# Patient Record
Sex: Female | Born: 1950 | Race: White | Hispanic: No | Smoking: Former smoker
Health system: Southern US, Community
[De-identification: ages and names within clinical notes are randomized; demographics above are authoritative.]

## PROBLEM LIST (undated history)

## (undated) DIAGNOSIS — E7800 Pure hypercholesterolemia, unspecified: Secondary | ICD-10-CM

## (undated) DIAGNOSIS — M1990 Unspecified osteoarthritis, unspecified site: Secondary | ICD-10-CM

## (undated) DIAGNOSIS — F410 Panic disorder [episodic paroxysmal anxiety] without agoraphobia: Secondary | ICD-10-CM

## (undated) DIAGNOSIS — R079 Chest pain, unspecified: Secondary | ICD-10-CM

## (undated) DIAGNOSIS — K589 Irritable bowel syndrome without diarrhea: Secondary | ICD-10-CM

## (undated) DIAGNOSIS — K219 Gastro-esophageal reflux disease without esophagitis: Secondary | ICD-10-CM

## (undated) DIAGNOSIS — I2699 Other pulmonary embolism without acute cor pulmonale: Secondary | ICD-10-CM

## (undated) DIAGNOSIS — N289 Disorder of kidney and ureter, unspecified: Secondary | ICD-10-CM

## (undated) DIAGNOSIS — F419 Anxiety disorder, unspecified: Secondary | ICD-10-CM

## (undated) DIAGNOSIS — K5790 Diverticulosis of intestine, part unspecified, without perforation or abscess without bleeding: Secondary | ICD-10-CM

## (undated) DIAGNOSIS — C801 Malignant (primary) neoplasm, unspecified: Secondary | ICD-10-CM

## (undated) HISTORY — PX: TUBAL LIGATION: SHX77

## (undated) HISTORY — DX: Pure hypercholesterolemia, unspecified: E78.00

---

## 1999-05-11 ENCOUNTER — Ambulatory Visit (HOSPITAL_COMMUNITY): Admission: RE | Admit: 1999-05-11 | Discharge: 1999-05-11 | Payer: Self-pay | Admitting: Gastroenterology

## 1999-05-11 ENCOUNTER — Encounter: Payer: Self-pay | Admitting: Gastroenterology

## 1999-05-19 ENCOUNTER — Ambulatory Visit (HOSPITAL_COMMUNITY): Admission: RE | Admit: 1999-05-19 | Discharge: 1999-05-19 | Payer: Self-pay | Admitting: Gastroenterology

## 1999-06-24 ENCOUNTER — Encounter: Payer: Self-pay | Admitting: Gynecology

## 1999-06-24 ENCOUNTER — Encounter: Admission: RE | Admit: 1999-06-24 | Discharge: 1999-06-24 | Payer: Self-pay | Admitting: Gynecology

## 2000-12-07 ENCOUNTER — Encounter: Payer: Self-pay | Admitting: Gynecology

## 2000-12-07 ENCOUNTER — Encounter: Admission: RE | Admit: 2000-12-07 | Discharge: 2000-12-07 | Payer: Self-pay | Admitting: Gynecology

## 2001-01-10 ENCOUNTER — Other Ambulatory Visit: Admission: RE | Admit: 2001-01-10 | Discharge: 2001-01-10 | Payer: Self-pay | Admitting: Gynecology

## 2001-06-06 ENCOUNTER — Ambulatory Visit (HOSPITAL_COMMUNITY): Admission: RE | Admit: 2001-06-06 | Discharge: 2001-06-06 | Payer: Self-pay | Admitting: Gastroenterology

## 2002-11-27 ENCOUNTER — Other Ambulatory Visit: Admission: RE | Admit: 2002-11-27 | Discharge: 2002-11-27 | Payer: Self-pay | Admitting: Gynecology

## 2004-02-07 ENCOUNTER — Other Ambulatory Visit: Admission: RE | Admit: 2004-02-07 | Discharge: 2004-02-07 | Payer: Self-pay | Admitting: Gynecology

## 2006-02-03 ENCOUNTER — Other Ambulatory Visit: Admission: RE | Admit: 2006-02-03 | Discharge: 2006-02-03 | Payer: Self-pay | Admitting: Gynecology

## 2013-06-28 ENCOUNTER — Other Ambulatory Visit: Payer: Self-pay | Admitting: Gastroenterology

## 2013-06-28 DIAGNOSIS — R109 Unspecified abdominal pain: Secondary | ICD-10-CM

## 2013-07-05 ENCOUNTER — Ambulatory Visit
Admission: RE | Admit: 2013-07-05 | Discharge: 2013-07-05 | Disposition: A | Discharge status: Home / Self Care | Payer: BC Managed Care – PPO | Source: Ambulatory Visit | Attending: Gastroenterology | Admitting: Gastroenterology

## 2013-07-05 ENCOUNTER — Encounter (INDEPENDENT_AMBULATORY_CARE_PROVIDER_SITE_OTHER): Payer: Self-pay

## 2013-07-05 DIAGNOSIS — R109 Unspecified abdominal pain: Secondary | ICD-10-CM

## 2016-01-26 DIAGNOSIS — I2699 Other pulmonary embolism without acute cor pulmonale: Secondary | ICD-10-CM

## 2016-01-26 HISTORY — DX: Other pulmonary embolism without acute cor pulmonale: I26.99

## 2016-05-10 ENCOUNTER — Encounter: Payer: Self-pay | Admitting: Adult Health

## 2017-03-26 ENCOUNTER — Emergency Department (HOSPITAL_COMMUNITY): Payer: Medicare Other

## 2017-03-26 ENCOUNTER — Observation Stay (HOSPITAL_COMMUNITY)
Admission: EM | Admit: 2017-03-26 | Discharge: 2017-03-27 | Disposition: A | Discharge status: Home / Self Care | Payer: Medicare Other | Attending: Family Medicine | Admitting: Family Medicine

## 2017-03-26 ENCOUNTER — Encounter (HOSPITAL_COMMUNITY): Payer: Self-pay

## 2017-03-26 ENCOUNTER — Other Ambulatory Visit: Payer: Self-pay

## 2017-03-26 DIAGNOSIS — R002 Palpitations: Secondary | ICD-10-CM | POA: Diagnosis not present

## 2017-03-26 DIAGNOSIS — F419 Anxiety disorder, unspecified: Secondary | ICD-10-CM | POA: Diagnosis not present

## 2017-03-26 DIAGNOSIS — Z905 Acquired absence of kidney: Secondary | ICD-10-CM | POA: Insufficient documentation

## 2017-03-26 DIAGNOSIS — K5790 Diverticulosis of intestine, part unspecified, without perforation or abscess without bleeding: Secondary | ICD-10-CM | POA: Diagnosis present

## 2017-03-26 DIAGNOSIS — K219 Gastro-esophageal reflux disease without esophagitis: Secondary | ICD-10-CM | POA: Insufficient documentation

## 2017-03-26 DIAGNOSIS — F410 Panic disorder [episodic paroxysmal anxiety] without agoraphobia: Secondary | ICD-10-CM | POA: Diagnosis present

## 2017-03-26 DIAGNOSIS — Z823 Family history of stroke: Secondary | ICD-10-CM | POA: Insufficient documentation

## 2017-03-26 DIAGNOSIS — Z8249 Family history of ischemic heart disease and other diseases of the circulatory system: Secondary | ICD-10-CM | POA: Diagnosis not present

## 2017-03-26 DIAGNOSIS — Z86711 Personal history of pulmonary embolism: Secondary | ICD-10-CM | POA: Diagnosis not present

## 2017-03-26 DIAGNOSIS — N289 Disorder of kidney and ureter, unspecified: Secondary | ICD-10-CM | POA: Diagnosis not present

## 2017-03-26 DIAGNOSIS — D72829 Elevated white blood cell count, unspecified: Secondary | ICD-10-CM | POA: Diagnosis not present

## 2017-03-26 DIAGNOSIS — K047 Periapical abscess without sinus: Secondary | ICD-10-CM | POA: Diagnosis not present

## 2017-03-26 DIAGNOSIS — R0789 Other chest pain: Secondary | ICD-10-CM | POA: Diagnosis not present

## 2017-03-26 DIAGNOSIS — R079 Chest pain, unspecified: Secondary | ICD-10-CM | POA: Diagnosis not present

## 2017-03-26 DIAGNOSIS — K5791 Diverticulosis of intestine, part unspecified, without perforation or abscess with bleeding: Secondary | ICD-10-CM

## 2017-03-26 HISTORY — DX: Panic disorder (episodic paroxysmal anxiety): F41.0

## 2017-03-26 HISTORY — DX: Other pulmonary embolism without acute cor pulmonale: I26.99

## 2017-03-26 HISTORY — DX: Diverticulosis of intestine, part unspecified, without perforation or abscess without bleeding: K57.90

## 2017-03-26 HISTORY — DX: Gastro-esophageal reflux disease without esophagitis: K21.9

## 2017-03-26 HISTORY — DX: Anxiety disorder, unspecified: F41.9

## 2017-03-26 HISTORY — DX: Chest pain, unspecified: R07.9

## 2017-03-26 HISTORY — DX: Irritable bowel syndrome, unspecified: K58.9

## 2017-03-26 HISTORY — DX: Disorder of kidney and ureter, unspecified: N28.9

## 2017-03-26 LAB — URINALYSIS, ROUTINE W REFLEX MICROSCOPIC
BACTERIA UA: NONE SEEN
BILIRUBIN URINE: NEGATIVE
Glucose, UA: NEGATIVE mg/dL
Ketones, ur: NEGATIVE mg/dL
LEUKOCYTES UA: NEGATIVE
Nitrite: NEGATIVE
PH: 7 (ref 5.0–8.0)
Protein, ur: NEGATIVE mg/dL
Specific Gravity, Urine: 1.012 (ref 1.005–1.030)

## 2017-03-26 LAB — LIPID PANEL
CHOL/HDL RATIO: 3.9 ratio
Cholesterol: 248 mg/dL — ABNORMAL HIGH (ref 0–200)
HDL: 64 mg/dL (ref >40)
LDL CALC: 173 mg/dL — AB (ref 0–99)
Triglycerides: 57 mg/dL (ref <150)
VLDL: 11 mg/dL (ref 0–40)

## 2017-03-26 LAB — PROTIME-INR
INR: 0.96
Prothrombin Time: 12.6 seconds (ref 11.4–15.2)

## 2017-03-26 LAB — COMPREHENSIVE METABOLIC PANEL
ALK PHOS: 57 U/L (ref 38–126)
ALT: 28 U/L (ref 14–54)
AST: 26 U/L (ref 15–41)
Albumin: 3.9 g/dL (ref 3.5–5.0)
Anion gap: 11 (ref 5–15)
BILIRUBIN TOTAL: 0.7 mg/dL (ref 0.3–1.2)
BUN: 16 mg/dL (ref 6–20)
CALCIUM: 9.3 mg/dL (ref 8.9–10.3)
CO2: 24 mmol/L (ref 22–32)
Chloride: 104 mmol/L (ref 101–111)
Creatinine, Ser: 0.73 mg/dL (ref 0.44–1.00)
Glucose, Bld: 103 mg/dL — ABNORMAL HIGH (ref 65–99)
Potassium: 3.5 mmol/L (ref 3.5–5.1)
SODIUM: 139 mmol/L (ref 135–145)
TOTAL PROTEIN: 6.6 g/dL (ref 6.5–8.1)

## 2017-03-26 LAB — CBC WITH DIFFERENTIAL/PLATELET
BASOS ABS: 0 10*3/uL (ref 0.0–0.1)
BASOS PCT: 0 %
EOS PCT: 0 %
Eosinophils Absolute: 0 10*3/uL (ref 0.0–0.7)
HCT: 41.1 % (ref 36.0–46.0)
Hemoglobin: 14.6 g/dL (ref 12.0–15.0)
Lymphocytes Relative: 5 %
Lymphs Abs: 0.9 10*3/uL (ref 0.7–4.0)
MCH: 34.8 pg — ABNORMAL HIGH (ref 26.0–34.0)
MCHC: 35.5 g/dL (ref 30.0–36.0)
MCV: 98.1 fL (ref 78.0–100.0)
MONO ABS: 0.7 10*3/uL (ref 0.1–1.0)
MONOS PCT: 4 %
Neutro Abs: 16.9 10*3/uL — ABNORMAL HIGH (ref 1.7–7.7)
Neutrophils Relative %: 91 %
PLATELETS: 203 10*3/uL (ref 150–400)
RBC: 4.19 MIL/uL (ref 3.87–5.11)
RDW: 12.7 % (ref 11.5–15.5)
WBC: 18.5 10*3/uL — ABNORMAL HIGH (ref 4.0–10.5)

## 2017-03-26 LAB — I-STAT TROPONIN, ED
TROPONIN I, POC: 0 ng/mL (ref 0.00–0.08)
Troponin i, poc: 0 ng/mL (ref 0.00–0.08)

## 2017-03-26 LAB — BRAIN NATRIURETIC PEPTIDE: B Natriuretic Peptide: 173.3 pg/mL — ABNORMAL HIGH (ref 0.0–100.0)

## 2017-03-26 LAB — TROPONIN I

## 2017-03-26 LAB — MAGNESIUM: MAGNESIUM: 1.7 mg/dL (ref 1.7–2.4)

## 2017-03-26 LAB — CBG MONITORING, ED: Glucose-Capillary: 105 mg/dL — ABNORMAL HIGH (ref 65–99)

## 2017-03-26 LAB — TSH: TSH: 1.554 u[IU]/mL (ref 0.350–4.500)

## 2017-03-26 MED ORDER — ASPIRIN 81 MG PO CHEW: 324.0000 mg | CHEWABLE_TABLET | Freq: Once | ORAL | Status: DC

## 2017-03-26 MED ORDER — ACETAMINOPHEN 325 MG PO TABS
650.0000 mg | ORAL_TABLET | ORAL | Status: DC | PRN
  Administered 2017-03-26 (×2): 650 mg via ORAL
  Filled 2017-03-26 (×2): qty 2

## 2017-03-26 MED ORDER — LORAZEPAM 2 MG/ML IJ SOLN
0.5000 mg | Freq: Once | INTRAMUSCULAR | Status: AC
  Administered 2017-03-26: 0.5 mg via INTRAVENOUS
  Filled 2017-03-26: qty 1

## 2017-03-26 MED ORDER — PANTOPRAZOLE SODIUM 40 MG PO TBEC
40.0000 mg | DELAYED_RELEASE_TABLET | Freq: Every day | ORAL | Status: DC
  Administered 2017-03-26: 40 mg via ORAL
  Filled 2017-03-26: qty 1

## 2017-03-26 MED ORDER — LORAZEPAM 0.5 MG PO TABS: 0.5000 mg | ORAL_TABLET | Freq: Two times a day (BID) | ORAL | Status: DC | PRN

## 2017-03-26 MED ORDER — MORPHINE SULFATE (PF) 4 MG/ML IV SOLN: 2.0000 mg | INTRAVENOUS | Status: DC | PRN

## 2017-03-26 MED ORDER — ONDANSETRON HCL 4 MG/2ML IJ SOLN: 4.0000 mg | Freq: Four times a day (QID) | INTRAMUSCULAR | Status: DC | PRN

## 2017-03-26 MED ORDER — GI COCKTAIL ~~LOC~~: 30.0000 mL | Freq: Four times a day (QID) | ORAL | Status: DC | PRN

## 2017-03-26 MED ORDER — ASPIRIN 81 MG PO CHEW
324.0000 mg | CHEWABLE_TABLET | Freq: Once | ORAL | Status: DC
  Filled 2017-03-26 (×2): qty 4

## 2017-03-26 MED ORDER — GI COCKTAIL ~~LOC~~
30.0000 mL | Freq: Once | ORAL | Status: AC
  Administered 2017-03-26: 30 mL via ORAL
  Filled 2017-03-26: qty 30

## 2017-03-26 MED ORDER — ENOXAPARIN SODIUM 40 MG/0.4ML ~~LOC~~ SOLN
40.0000 mg | SUBCUTANEOUS | Status: DC
  Administered 2017-03-26: 40 mg via SUBCUTANEOUS
  Filled 2017-03-26: qty 0.4

## 2017-03-26 MED ORDER — FAMOTIDINE IN NACL 20-0.9 MG/50ML-% IV SOLN
20.0000 mg | Freq: Two times a day (BID) | INTRAVENOUS | Status: DC
  Administered 2017-03-26 (×2): 20 mg via INTRAVENOUS
  Filled 2017-03-26 (×3): qty 50

## 2017-03-26 NOTE — ED Provider Notes (Signed)
[place] EMERGENCY DEPARTMENT Provider Note   CSN: [id number] Arrival date & time: 03/26/17  0407     History   Chief Complaint Chief Complaint  Patient presents with  . Chest Pain    HPI Melissa [name] is a 67 y.o. female.  HPI Presents with concern of fluttering and pain. She notes that she has had episodes of fluttering in the past, but now over the past 2 days she has had much more frequent episodes of palpitations, as well as chest pressure. The 2 entities are not necessarily concurrent, nor are there necessarily related, but she has had both, together, and separately, over the past 2 days. No clear precipitating, alleviating, exacerbating factors. No current pain, no current fluttering sensation. She had 2 episodes, while in transport with EMS, neither of which was noted on monitor to be abnormal, according to providers. She denies recent changes in health beyond taking amoxicillin for a dental infection. She does note ongoing life stress, including the death of her husband, within the past few months.  Past Medical History:  Diagnosis Date  . GERD (gastroesophageal reflux disease)   . Kidney disorder    Pt states only has left kidney  . Pulmonary embolism ([place]) 2018   pt states resolved and no longer takes blood thinners     Home Medications    Family History Husband just died from pancreatic cancer  Social History Social History   Tobacco Use  . Smoking status: Never Smoker  . Smokeless tobacco: Never Used  Substance Use Topics  . Alcohol use: Not on file  . Drug use: Not on file     Allergies   Patient has no allergy information on record.   Review of Systems Review of Systems  Constitutional:       Per HPI, otherwise negative  HENT:       Per HPI, otherwise negative  Respiratory:       Per HPI, otherwise negative  Cardiovascular:       Per HPI, otherwise negative  Gastrointestinal: Negative for vomiting.  Endocrine:         Negative aside from HPI  Genitourinary:       Neg aside from HPI   Musculoskeletal:       Per HPI, otherwise negative  Skin: Negative.   Neurological: Negative for syncope.     Physical Exam Updated Vital Signs BP (!) 151/81   Pulse 75   Temp 98.1 F (36.7 C) (Oral)   Resp 14   Ht 5\' 4"  (1.626 m)   Wt 74.4 kg (164 lb)   SpO2 97%   BMI 28.15 kg/m   Physical Exam  Constitutional: She is oriented to person, place, and time. She appears well-developed and well-nourished. No distress.  HENT:  Head: Normocephalic and atraumatic.  Eyes: Conjunctivae and EOM are normal.  Cardiovascular: Normal rate and regular rhythm.  Pulmonary/Chest: Effort normal and breath sounds normal. No stridor. No respiratory distress.  Abdominal: She exhibits no distension.  Musculoskeletal: She exhibits no edema.  Neurological: She is alert and oriented to person, place, and time. No cranial nerve deficit.  Skin: Skin is warm and dry.  Psychiatric: She has a normal mood and affect.  Nursing note and vitals reviewed.    ED Treatments / Results  Labs (all labs ordered are listed, but only abnormal results are displayed) Labs Reviewed  CBC WITH DIFFERENTIAL/PLATELET - Abnormal; Notable for the following components:      Result Value  WBC 18.5 (*)    MCH 34.8 (*)    Neutro Abs 16.9 (*)    All other components within normal limits  CBG MONITORING, ED - Abnormal; Notable for the following components:   Glucose-Capillary 105 (*)    All other components within normal limits  PROTIME-INR  COMPREHENSIVE METABOLIC PANEL  MAGNESIUM  TROPONIN I  BRAIN NATRIURETIC PEPTIDE  I-STAT TROPONIN, ED    EKG  EKG Interpretation  Date/Time:  Saturday March 26 2017 04:10:03 EST Ventricular Rate:  79 PR Interval:    QRS Duration: 100 QT Interval:  394 QTC Calculation: 452 R Axis:   -21 Text Interpretation:  Sinus rhythm Borderline left axis deviation Low voltage, precordial leads Otherwise  within normal limits Confirmed by Carmin Muskrat [phone]) on 03/26/2017 4:56:03 AM      EMS rhythm strip with sinus rhythm, rate 83, unremarkable    radiology Dg Chest Portable 1 View  Result Date: 03/26/2017 CLINICAL DATA:  Chest pain and shortness of breath. EXAM: PORTABLE CHEST 1 VIEW COMPARISON:  05/04/2016 FINDINGS: The cardiomediastinal contours are normal. The lungs are clear. Pulmonary vasculature is normal. No consolidation, pleural effusion, or pneumothorax. No acute osseous abnormalities are seen. IMPRESSION: No acute abnormality. Electronically Signed   By: Jeb Levering M.D.   On: 03/26/2017 04:38    Procedures Procedures (including critical care time)  Medications Ordered in ED Medications  gi cocktail (Maalox,Lidocaine,Donnatal) (not administered)  LORazepam (ATIVAN) injection 0.5 mg (not administered)     Initial Impression / Assessment and Plan / ED Course  I have reviewed the triage vital signs and the nursing notes.  Pertinent labs & imaging results that were available during my care of the patient were reviewed by me and considered in my medical decision making (see chart for details).    5:40 AM I was called to the patient's room as she was experiencing a fluttering sensation. Is awake, alert, and on cardiac monitor has a sinus rhythm, rate 75, with no ischemic changes. She states that she just had an episode of pain as well, though this has resolved.  6:34 AM Patient continues to have episodic discomfort, though she states that she feels generally better, after Ativan and a GI cocktail. Given the absence of prior cardiac evaluation, though the initial studies here are generally reassuring aside from mild elevation in BNP, and with her episodic pain, palpitations, the patient will be admitted for further evaluation and management.  Final Clinical Impressions(s) / ED Diagnoses  Atypical chest pain   Carmin Muskrat, MD 03/26/17 [phone]

## 2017-03-26 NOTE — Progress Notes (Signed)
Alert and oriented female admitted to unit with daughter present. Patient states she is not having any chest pain at this time and hasn't for a while. Skin warm and dry, v/s WNL.   Patient shares with this RN that she just lost her husband in Oct 2018 to pancreatic cancer and that she was his primary caregiver. She states she hopes this is just "nerves".   Call bell at side and will continue to monitor for any s/sx of discomfort. Pt is up ad lib.  Chaplain services offered but declined at this time.

## 2017-03-26 NOTE — ED Notes (Signed)
ED Provider at bedside. 

## 2017-03-26 NOTE — H&P (Signed)
History and Physical    Melissa [name] GUR:[id number] DOB: Jun 17, 1950 DOA: 03/26/2017  PCP: [facility] Patient coming from: home  Chief Complaint: chest pressure/fluttering  HPI: Melissa [name] is a very pleasant 67 y.o. female with medical history significant for remote PE no longer on anticoagulant, kidney disorder, irritable bowel syndrome, GERD, diverticulosis, anxiety/panic attacks presents to the emergency Department chief complaint persistent chest pressure/fluttering. [facility] are asked to admit  Information is obtained from the patient. She states last week she had a dental infection was placed on amoxicillin and had that tooth extracted. She did not complete the amoxicillin as it made her nauseous. 2 days ago she developed palpitations associated with shortness of breath. She states sensation was constant and not related to rest or activity. today she also developed chest "pressure". States the pressure is located anterior chest. She describes a "jitteriness". She denies any headache dizziness syncope or near-syncope. She denies diaphoresis nausea vomiting lower extremity edema. She denies any fever chills. She denies abdominal pain dysuria hematuria frequency or urgency. He reports he vacillates between constipation and diarrhea due to her irritable bowel syndrome. He denies melena bright red blood per rectum. She had a colonoscopy recently revealing diverticulosis and internal hemorrhoids. She also reports in January 2017 her husband was diagnosed with pancreatic cancer was his primary caregiver and he died 4 months ago.  Reportedly while being transported to the hospital by EMS she stated she was having palpitations and EKG within the limits of normal    ED Course: In the emergency department she's afebrile hemodynamically stable and not hypoxic.  Review of Systems: As per HPI otherwise all other systems reviewed and are negative.    Ambulatory Status: Ambulates independently and is independent with ADLs  Past Medical History:  Diagnosis Date  . Anxiety   . Chest pain   . Diverticulosis   . GERD (gastroesophageal reflux disease)   . IBS (irritable bowel syndrome)   . Kidney disorder    Pt states only has left kidney  . Panic attacks   . Pulmonary embolism ([place]) 2018   pt states resolved and no longer takes blood thinners      Social History   Socioeconomic History  . Marital status: Married    Spouse name: Not on file  . Number of children: Not on file  . Years of education: Not on file  . Highest education level: Not on file  Social Needs  . Financial resource strain: Not on file  . Food insecurity - worry: Not on file  . Food insecurity - inability: Not on file  . Transportation needs - medical: Not on file  . Transportation needs - non-medical: Not on file  Occupational History  . Not on file  Tobacco Use  . Smoking status: Never Smoker  . Smokeless tobacco: Never Used  Substance and Sexual Activity  . Alcohol use: Not on file  . Drug use: Not on file  . Sexual activity: Not on file  Other Topics Concern  . Not on file  Social History Narrative  . Not on file    Not on File  Family History  Problem Relation Age of Onset  . Alzheimer's disease Mother   . CAD Father   . Stroke Father     Prior to Admission medications   Not on File    Physical Exam: Vitals:   03/26/17 0530 03/26/17 0615 03/26/17 0645 03/26/17 0700  BP: (!) 151/81 127/70 121/69 138/77  Pulse: 75 72 72 83  Resp: 14 15 15 15   Temp:      TempSrc:      SpO2: 97% 94% 93% 96%  Weight:      Height:         General:  Appears only slightly anxious but in no acute distress Eyes:  PERRL, EOMI, normal lids, iris ENT:  grossly normal hearing, lips & tongue, mucous membranes of her mouth are pink slightly dry Neck:  no LAD, masses or thyromegaly Cardiovascular:  RRR, no m/r/g. No LE edema.  Respiratory:  CTA  bilaterally, no w/r/r. Normal respiratory effort. Abdomen:  soft, ntnd, positive bowel sounds no guarding or rebounding Skin:  no rash or induration seen on limited exam Musculoskeletal:  grossly normal tone BUE/BLE, good ROM, no bony abnormality Psychiatric:  grossly normal mood and affect, speech fluent and appropriate, AOx3 Neurologic:  CN 2-12 grossly intact, moves all extremities in coordinated fashion, sensation intact. [place] clear facial symmetry  Labs on Admission: I have personally reviewed following labs and imaging studies  CBC: Recent Labs  Lab 03/26/17 0429  WBC 18.5*  NEUTROABS 16.9*  HGB 14.6  HCT 41.1  MCV 98.1  PLT 161   Basic Metabolic Panel: Recent Labs  Lab 03/26/17 0429  NA 139  K 3.5  CL 104  CO2 24  GLUCOSE 103*  BUN 16  CREATININE 0.73  CALCIUM 9.3  MG 1.7   GFR: Estimated Creatinine Clearance: 68.4 mL/min (by C-G formula based on SCr of 0.73 mg/dL). Liver Function Tests: Recent Labs  Lab 03/26/17 0429  AST 26  ALT 28  ALKPHOS 57  BILITOT 0.7  PROT 6.6  ALBUMIN 3.9   No results for input(s): LIPASE, AMYLASE in the last 168 hours. No results for input(s): AMMONIA in the last 168 hours. Coagulation Profile: Recent Labs  Lab 03/26/17 0429  INR 0.96   Cardiac Enzymes: Recent Labs  Lab 03/26/17 0429  TROPONINI <0.03   BNP (last 3 results) No results for input(s): PROBNP in the last 8760 hours. HbA1C: No results for input(s): HGBA1C in the last 72 hours. CBG: Recent Labs  Lab 03/26/17 0444  GLUCAP 105*   Lipid Profile: No results for input(s): CHOL, HDL, LDLCALC, TRIG, CHOLHDL, LDLDIRECT in the last 72 hours. Thyroid Function Tests: No results for input(s): TSH, T4TOTAL, FREET4, T3FREE, THYROIDAB in the last 72 hours. Anemia Panel: No results for input(s): VITAMINB12, FOLATE, FERRITIN, TIBC, IRON, RETICCTPCT in the last 72 hours. Urine analysis: No results found for: COLORURINE, APPEARANCEUR, LABSPEC, PHURINE, GLUCOSEU,  HGBUR, BILIRUBINUR, KETONESUR, PROTEINUR, UROBILINOGEN, NITRITE, LEUKOCYTESUR  Creatinine Clearance: Estimated Creatinine Clearance: 68.4 mL/min (by C-G formula based on SCr of 0.73 mg/dL).  Sepsis Labs: @LABRCNTIP (procalcitonin:4,lacticidven:4) )No results found for this or any previous visit (from the past 240 hour(s)).   Radiological Exams on Admission: Dg Chest Portable 1 View  Result Date: 03/26/2017 CLINICAL DATA:  Chest pain and shortness of breath. EXAM: PORTABLE CHEST 1 VIEW COMPARISON:  05/04/2016 FINDINGS: The cardiomediastinal contours are normal. The lungs are clear. Pulmonary vasculature is normal. No consolidation, pleural effusion, or pneumothorax. No acute osseous abnormalities are seen. IMPRESSION: No acute abnormality. Electronically Signed   By: Jeb Levering M.D.   On: 03/26/2017 04:38    EKG: Sinus rhythm Borderline left axis deviation Low voltage, precordial leads Otherwise within normal limits  Assessment/Plan Principal Problem:   Chest pain Active Problems:   Panic attacks   Leukocytosis   GERD (gastroesophageal reflux disease)   Diverticulosis  Anxiety   Kidney disorder   Infected tooth   #1. Chest pain. Atypical. Likely related to anxiety situational in the setting of generalized anxiety disorder? Also has a history of GERD. Heart score 3. Father with heart disease and stroke former smoker quit in 1987. Initial troponin negative. EKG as noted above. Chest x-ray with no acute abnormality -Admit to telemetry -Cycle troponin -Serial EKG -Obtain a TSH -Lipid panel -Aspirin -GI cocktail -Likely discharge tomorrow follow-up with PCP to determine need for OP stress test  #2. Anxiety/panic attacks. Patient reports history of same. She states she has tried Lexapro in the past but stopped due to side effects. She says she takes one half of a Xanax on occasion to help her sleep. She believes the dosage is 0.25. Is been recently died and for a year prior to  that she was his primary caregiver. More recently she had tooth abscess with extraction, has a pet who is ill needing surgery.  -When necessary Ativan -Outpatient follow-up with primary care provider  #3. Leukocytosis. She recently had a tooth extracted due to infection. WBC 18.5. She was given amoxicillin prior to the extraction but she did not complete the course. Chest x-ray without infiltrate. Urinalysis pending. [place] the emergency department she is afebrile hemodynamically stable and nontoxic appearing -Monitor  #4. GERD. -GI cocktail -Continue home meds  #5. Diverticulosis. Stable  #6. Kidney disorder. She states 15 years ago her right kidney atrophied. BUN and creatinine within the limits of normal     DVT prophylaxis: lovenox  Code Status: full  Family Communication: daughter at bedside  Disposition Plan: home hopefully in am  Consults called: none  Admission status: obs    Radene Gunning MD [facility]  If 7PM-7AM, please contact night-coverage [email or web address] Password [facility]  03/26/2017, 7:36 AM

## 2017-03-26 NOTE — ED Triage Notes (Signed)
Pt cc of chest pain x 2days. Pt states feel flutter sensation with weakness and SOB. Pt states chest pain has been constant. No n/v/d associated with chest pain. Pt states has hx of panic attacks and a lot has occurred within the year including losing her husband. States takes medication for anxiety.

## 2017-03-26 NOTE — ED Notes (Signed)
Pt states due to kidney disease and stomach ulcers does not take aspirin.

## 2017-03-26 NOTE — ED Notes (Signed)
Pt alert and oriented x4. Skin warm and dry. Respirations equal and unlabored. s1 and s2 heart sounds audible. NSR to cardiac monitor. Pt cc of chest pain x 2days. Denies chest pain radiating, states currently does  Not have chest pain. Pt ambulatory with steady gait. VSS.

## 2017-03-26 NOTE — ED Notes (Signed)
Pt ambulatory to restroom with steady gait.

## 2017-03-26 NOTE — Progress Notes (Signed)
Patient has offered no c/o discomfort, or specific chest pain since arrival to unit. She has been up and down in her room to bathroom.  Call bell at side and pt instructed to call if she experiences any chest discomfort at all.

## 2017-03-26 NOTE — ED Notes (Signed)
Attempted to call report, RN to call back.

## 2017-03-27 DIAGNOSIS — F419 Anxiety disorder, unspecified: Secondary | ICD-10-CM | POA: Diagnosis not present

## 2017-03-27 DIAGNOSIS — R0789 Other chest pain: Secondary | ICD-10-CM | POA: Diagnosis not present

## 2017-03-27 DIAGNOSIS — R002 Palpitations: Secondary | ICD-10-CM | POA: Diagnosis not present

## 2017-03-27 DIAGNOSIS — D72829 Elevated white blood cell count, unspecified: Secondary | ICD-10-CM | POA: Diagnosis not present

## 2017-03-27 NOTE — Discharge Summary (Signed)
Physician Discharge Summary  Melissa [name] WUJ:[id number] DOB: [birth year] DOA: 03/26/2017  [name]  Admit date: 03/26/2017 Discharge date: 03/27/2017  Admitted From: Home  Disposition:  Home   Recommendations for Outpatient Follow-up:  1. Follow up with PCP in 1-2 weeks 2. Follow up with Cardiology monitor clinic tomorrow 3. Follow up for stress testing with Cardiology   Home Health: None  Equipment/Devices: none  Discharge Condition: Good  CODE STATUS: FULL Diet recommendation: Cardiac  Brief/Interim Summary: Mrs. Melissa [name] is a 67 yo F with anxiety who presents with chest discomfort and palpitations.  The patient was in her usual state of health until yesterday morning at 2AM she was awoken by "heart fluttering" and chest discomfort.  She called 9-1-1, who transported her to the hospital.  There she was found to have NSR on ECG, equivocal/minimal ST changes inferiorly, negative troponin and TRH were asked to rule out ACS.       1. Palpitations She was monitored 24 hours on telemetry and had only Normal sinus rhythm, but also no definite further episodes of palpitations or fluttering while on tele.  As a result, was discharged with follow up with Cardiology for cardiac monitor.  2. Chest pain This was atypical in character and likely from panic, but given her chest pain as well as age, history of coronary disease in her father, referral for stress treadmill was made.  3. Anxiety The patient stated she has history of panic attacks, none recently, including the current episode, which was not a panic attack she feels.  She stated she had not yet started buspirone given by her PCP, but was now determined to start it, and will follow up with her PCP soon.        Discharge Diagnoses:  Principal Problem:   Chest pain Active Problems:   Panic attacks   Leukocytosis   GERD (gastroesophageal reflux disease)   Diverticulosis   Anxiety  Kidney disorder   Infected tooth    Discharge Instructions  Discharge Instructions    Diet general   Complete by:  As directed    Discharge instructions   Complete by:  As directed    From Dr. Loleta Books: You were admitted for heart palpitations and chest pain. While you were here, your ECG and telemetry monitoring of the rate and rhythm of your heart showed no abnormal events (no "arrhythmias").   Our Cardiology office ([facility]) will contact you Monday afternoon to ask you to come in and pick up the monitor, teach you how to use it and set up follow up. They will also help you arrange the stress test in follow up.   Follow up with Dr. Deatra [name] this week or next week to discuss the buspirone, and how you feel with it, and if you want to try alternatives.   Increase activity slowly   Complete by:  As directed      Allergies as of 03/27/2017      Reactions   Clindamycin Diarrhea, Nausea Only      Medication List    TAKE these medications   acetaminophen 500 MG tablet Commonly known as:  TYLENOL Take 1,000 mg by mouth every 6 (six) hours as needed for mild pain.   ALPRAZolam 0.25 MG tablet Commonly known as:  XANAX Take 0.25 mg by mouth at bedtime as needed for anxiety.   hydrocortisone cream 1 % Apply 1 application topically as needed for itching.   hyoscyamine 0.125 MG Tbdp  disintergrating tablet Commonly known as:  ANASPAZ Take 0.125 mg by mouth every 6 (six) hours as needed.   omeprazole 20 MG capsule Commonly known as:  PRILOSEC Take 20 mg by mouth 2 (two) times daily before a meal.   tetrahydrozoline 0.05 % ophthalmic solution Place 1 drop into both eyes as needed (dry eyes).      Follow-up Information    [place] Follow up.   Specialty:  Family Medicine Contact information: [address] [place] [address] [phone]          Allergies  Allergen Reactions  . Clindamycin Diarrhea and Nausea  Only    Consultations:  None   Procedures/Studies: Dg Chest Portable 1 View  Result Date: 03/26/2017 CLINICAL DATA:  Chest pain and shortness of breath. EXAM: PORTABLE CHEST 1 VIEW COMPARISON:  05/04/2016 FINDINGS: The cardiomediastinal contours are normal. The lungs are clear. Pulmonary vasculature is normal. No consolidation, pleural effusion, or pneumothorax. No acute osseous abnormalities are seen. IMPRESSION: No acute abnormality. Electronically Signed   By: Jeb Levering M.D.   On: 03/26/2017 04:38       Subjective: Feels well.  No more fluttering, no more chest pain.  No dyspnea, pleuritic pain, hypoxia, weakness.  Discharge Exam: Vitals:   03/27/17 0624 03/27/17 0805  BP: 128/76 132/69  Pulse: 64 65  Resp: 16 18  Temp: 98 F (36.7 C) 98 F (36.7 C)  SpO2: 97% 100%   Vitals:   03/26/17 2113 03/27/17 0132 03/27/17 0624 03/27/17 0805  BP: 127/74 121/74 128/76 132/69  Pulse: 70 67 64 65  Resp: 16 18 16 18   Temp: 98.2 F (36.8 C) 97.7 F (36.5 C) 98 F (36.7 C) 98 F (36.7 C)  TempSrc: Oral Oral Oral Oral  SpO2: 96% 97% 97% 100%  Weight:   73.6 kg (162 lb 3.2 oz)   Height:        General: Pt is alert, awake, not in acute distress Cardiovascular: RRR, S1/S2 +, no rubs, no gallops Respiratory: CTA bilaterally, no wheezing, no rhonchi Abdominal: Soft, NT, ND, bowel sounds + Extremities: no edema, no cyanosis    The results of significant diagnostics from this hospitalization (including imaging, microbiology, ancillary and laboratory) are listed below for reference.     Microbiology: No results found for this or any previous visit (from the past 240 hour(s)).   Labs: BNP (last 3 results) Recent Labs    03/26/17 0429  BNP 627.0*   Basic Metabolic Panel: Recent Labs  Lab 03/26/17 0429  NA 139  K 3.5  CL 104  CO2 24  GLUCOSE 103*  BUN 16  CREATININE 0.73  CALCIUM 9.3  MG 1.7   Liver Function Tests: Recent Labs  Lab 03/26/17 0429  AST  26  ALT 28  ALKPHOS 57  BILITOT 0.7  PROT 6.6  ALBUMIN 3.9   No results for input(s): LIPASE, AMYLASE in the last 168 hours. No results for input(s): AMMONIA in the last 168 hours. CBC: Recent Labs  Lab 03/26/17 0429  WBC 18.5*  NEUTROABS 16.9*  HGB 14.6  HCT 41.1  MCV 98.1  PLT 203   Cardiac Enzymes: Recent Labs  Lab 03/26/17 0429 03/26/17 0724 03/26/17 1344 03/26/17 1936  TROPONINI <0.03 <0.03 <0.03 <0.03   BNP: Invalid input(s): POCBNP CBG: Recent Labs  Lab 03/26/17 0444  GLUCAP 105*   D-Dimer No results for input(s): DDIMER in the last 72 hours. Hgb A1c No results for input(s):  HGBA1C in the last 72 hours. Lipid Profile Recent Labs    03/26/17 0700  CHOL 248*  HDL 64  LDLCALC 173*  TRIG 57  CHOLHDL 3.9   Thyroid function studies Recent Labs    03/26/17 1344  TSH 1.554   Anemia work up No results for input(s): VITAMINB12, FOLATE, FERRITIN, TIBC, IRON, RETICCTPCT in the last 72 hours. Urinalysis    Component Value Date/Time   COLORURINE STRAW (A) 03/26/2017 0724   APPEARANCEUR CLEAR 03/26/2017 0724   LABSPEC 1.012 03/26/2017 0724   PHURINE 7.0 03/26/2017 0724   GLUCOSEU NEGATIVE 03/26/2017 0724   HGBUR SMALL (A) 03/26/2017 0724   BILIRUBINUR NEGATIVE 03/26/2017 0724   KETONESUR NEGATIVE 03/26/2017 0724   PROTEINUR NEGATIVE 03/26/2017 0724   NITRITE NEGATIVE 03/26/2017 0724   LEUKOCYTESUR NEGATIVE 03/26/2017 0724   Sepsis Labs Invalid input(s): PROCALCITONIN,  WBC,  LACTICIDVEN Microbiology No results found for this or any previous visit (from the past 240 hour(s)).   Time coordinating discharge: Over 30 minutes  SIGNED:   Edwin Dada, MD  [facility] 03/27/2017, 4:59 PM

## 2017-03-27 NOTE — Care Management Obs Status (Signed)
[place] NOTIFICATION   Patient Details  Name: Melissa [name] MRN: [id number] Date of Birth: Jun 29, 1950   Medicare Observation Status Notification Given:  Yes    Carles Collet, RN 03/27/2017, 9:44 AM

## 2017-04-18 ENCOUNTER — Ambulatory Visit: Payer: Medicare Other | Admitting: Internal Medicine

## 2017-04-18 ENCOUNTER — Encounter (INDEPENDENT_AMBULATORY_CARE_PROVIDER_SITE_OTHER): Payer: Self-pay

## 2017-04-18 ENCOUNTER — Encounter: Payer: Self-pay | Admitting: Internal Medicine

## 2017-04-18 VITALS — BP 142/72 | HR 74 | Ht 64.0 in | Wt 166.8 lb

## 2017-04-18 DIAGNOSIS — R002 Palpitations: Secondary | ICD-10-CM

## 2017-04-18 DIAGNOSIS — R0789 Other chest pain: Secondary | ICD-10-CM | POA: Diagnosis not present

## 2017-04-18 DIAGNOSIS — R0602 Shortness of breath: Secondary | ICD-10-CM | POA: Insufficient documentation

## 2017-04-18 NOTE — Patient Instructions (Addendum)
Medication Instructions:  Your physician recommends that you continue on your current medications as directed. Please refer to the Current Medication list given to you today.  -- If you need a refill on your cardiac medications before your next appointment, please call your pharmacy. --  Labwork: None ordered  Testing/Procedures: Your physician has requested that you have an exercise tolerance test. For further information please visit [email or web address]. Please also follow instruction sheet, as given.   Follow-Up: Your physician wants you to follow-up in: 3 months with Dr. Saunders Revel.     Thank you for choosing [facility]!!    Any Other Special Instructions Will Be Listed Below (If Applicable).

## 2017-04-18 NOTE — Progress Notes (Signed)
New Outpatient Visit Date: 04/18/2017  Primary Care Provider: [facility] At [address] [place], [place] [address]  Chief Complaint: Palpitations and chest pain  HPI:  Melissa [name] is a 67 y.o. female who is being seen today for the evaluation of atypical chest pain following recent hospitalization. She has a history of remote pulmonary embolism, solitary kidney, IBS, GERD, diverticulosis, and anxiety/panic attacks. She presented to the emergency department on 03/26/17 due to palpitations and accompanying shortness of breath. She also described a pressure" in the center of her chest. Of note, she had undergone tooth extraction and had been placed on amoxicillin the week prior due to a dental infection. Workup during overnight observation in the hospital was unrevealing. She was advised to follow-up with cardiology for further evaluation.  Today, Melissa [name] recounts the 2 episodes that she recently experienced, including the first one that brought her to the hospital.  She had been having some palpitations throughout the day that she attributed to stress.  However, she awoke around 2 AM and felt as though her heart was out of rhythm.  There was accompanying sharp pain across her left chest.  There was some accompanying shortness of breath.  She summoned [id number]*and was transported to the emergency department, where she continued to have the sensations until she received a GI cocktail and lorazepam.  The symptoms then promptly resolved.  After being discharged the next day, she had a similar episode 3 days later.  She summoned EMS who found everything to be normal by her report.  They recommended that she take a Xanax, which promptly relieved her symptoms.  Melissa [name] does not exercise regularly but has not had any exertional symptoms when she is doing activities around the house.  She denies a history of prior cardiac disease.  She had an echocardiogram at [facility] around the time of her pulmonary embolism in 2018, which was normal except for diastolic dysfunction.  She has never undergone ischemia testing.  --------------------------------------------------------------------------------------------------  Cardiovascular History & Procedures: Cardiovascular Problems:  Chest pain  Pulmonary embolism  Risk Factors:  Age  Cath/PCI:  None  CV Surgery:  None  EP Procedures and Devices:  None  Non-Invasive Evaluation(s):  TTE (03/19/16, [facility]): Normal LV size and wall thickness.  LVEF 60-65% with abnormal diastolic function.  Normal RV size and function.  No significant valvular abnormalities.  Recent CV Pertinent Labs: Lab Results  Component Value Date   CHOL 248 (H) 03/26/2017   HDL 64 03/26/2017   LDLCALC 173 (H) 03/26/2017   TRIG 57 03/26/2017   CHOLHDL 3.9 03/26/2017   INR 0.96 03/26/2017   BNP 173.3 (H) 03/26/2017   K 3.5 03/26/2017   MG 1.7 03/26/2017   BUN 16 03/26/2017   CREATININE 0.73 03/26/2017    --------------------------------------------------------------------------------------------------  Past Medical History:  Diagnosis Date  . Anxiety   . Chest pain   . Diverticulosis   . GERD (gastroesophageal reflux disease)   . IBS (irritable bowel syndrome)   . Kidney disorder    Pt states only has left kidney  . Panic attacks   . Pulmonary embolism ([place]) 2018   pt states resolved and no longer takes blood thinners    Past Surgical History:  Procedure Laterality Date  . TUBAL LIGATION      Current Meds  Medication Sig  . acetaminophen (TYLENOL) 500 MG tablet Take 1,000 mg by mouth every 6 (six) hours as needed for mild pain.  Marland Kitchen  ALPRAZolam (XANAX) 0.25 MG tablet TAKE 1/2 TABLET IN THE MORNING AND 1/2 IN THE EVENING AT BEDTIME  . hydrocortisone cream 1 % Apply 1 application topically as needed for itching.  . hyoscyamine (ANASPAZ) 0.125 MG TBDP disintergrating tablet Take 0.125 mg by mouth  every 6 (six) hours as needed.  Marland Kitchen omeprazole (PRILOSEC) 20 MG capsule Take 20 mg by mouth 2 (two) times daily before a meal.    Allergies: Clindamycin  Social History   Socioeconomic History  . Marital status: Married    Spouse name: Not on file  . Number of children: Not on file  . Years of education: Not on file  . Highest education level: Not on file  Occupational History  . Not on file  Social Needs  . Financial resource strain: Not on file  . Food insecurity:    Worry: Not on file    Inability: Not on file  . Transportation needs:    Medical: Not on file    Non-medical: Not on file  Tobacco Use  . Smoking status: Former Smoker    Packs/day: 20.00    Years: 1.00    Pack years: 20.00    Types: Cigarettes    Last attempt to quit: 1985    Years since quitting: 34.2  . Smokeless tobacco: Never Used  Substance and Sexual Activity  . Alcohol use: Never    Frequency: Never  . Drug use: Never  . Sexual activity: Not on file  Lifestyle  . Physical activity:    Days per week: Not on file    Minutes per session: Not on file  . Stress: Not on file  Relationships  . Social connections:    Talks on phone: Not on file    Gets together: Not on file    Attends religious service: Not on file    Active member of club or organization: Not on file    Attends meetings of clubs or organizations: Not on file    Relationship status: Not on file  . Intimate partner violence:    Fear of current or ex partner: Not on file    Emotionally abused: Not on file    Physically abused: Not on file    Forced sexual activity: Not on file  Other Topics Concern  . Not on file  Social History Narrative  . Not on file    Family History  Problem Relation Age of Onset  . Alzheimer's disease Mother   . Stroke Father 78  . Heart attack Maternal Grandmother   . Heart attack Paternal Grandmother     Review of Systems: A 12-system review of systems was performed and was negative except as  noted in the HPI.  --------------------------------------------------------------------------------------------------  Physical Exam: BP (!) 142/72   Pulse 74   Ht 5\' 4"  (1.626 m)   Wt 166 lb 12.8 oz (75.7 kg)   SpO2 96%   BMI 28.63 kg/m   General: No acute distress. HEENT: No conjunctival pallor or scleral icterus. Moist mucous membranes. OP clear. Neck: Supple without lymphadenopathy, thyromegaly, JVD, or HJR. No carotid bruit. Lungs: Normal work of breathing. Clear to auscultation bilaterally without wheezes or crackles. Heart: Regular rate and rhythm without murmurs, rubs, or gallops. Non-displaced PMI. Abd: Bowel sounds present. Soft, NT/ND without hepatosplenomegaly Ext: No lower extremity edema. Radial, PT, and DP pulses are 2+ bilaterally Skin: Warm and dry without rash. Neuro: CNIII-XII intact. Strength and fine-touch sensation intact in upper and lower extremities bilaterally. Psych:  Normal mood and affect.  EKG:  NSR without abnormalities.  Lab Results  Component Value Date   WBC 18.5 (H) 03/26/2017   HGB 14.6 03/26/2017   HCT 41.1 03/26/2017   MCV 98.1 03/26/2017   PLT 203 03/26/2017    Lab Results  Component Value Date   NA 139 03/26/2017   K 3.5 03/26/2017   CL 104 03/26/2017   CO2 24 03/26/2017   BUN 16 03/26/2017   CREATININE 0.73 03/26/2017   GLUCOSE 103 (H) 03/26/2017   ALT 28 03/26/2017    Lab Results  Component Value Date   CHOL 248 (H) 03/26/2017   HDL 64 03/26/2017   LDLCALC 173 (H) 03/26/2017   TRIG 57 03/26/2017   CHOLHDL 3.9 03/26/2017    --------------------------------------------------------------------------------------------------  ASSESSMENT AND PLAN: Palpitations, atypical chest pain, and shortness of breath Symptoms are most consistent with panic attacks/anxiety, particularly given prompt resolution with benzodiazepines.  Echocardiogram last year [facility] was normal except for diastolic dysfunction.  Only if again  cardiac risk factor is her age (> 62).  She has some second-degree relatives with coronary artery disease.  We have agreed to obtain an exercise tolerance test for further assessment.  Given that she is only had 2 episodes of palpitations, both in the setting of anxiety,, we have agreed to defer ambulatory rhythm monitoring.  He is to have recurrent palpitations despite adequate treatment of her underlying anxiety, event monitor placement will need to be considered.  Follow-up: Return to clinic in 3 months.  Nelva Bush, MD 04/18/2017 7:41 PM

## 2017-04-27 ENCOUNTER — Ambulatory Visit (INDEPENDENT_AMBULATORY_CARE_PROVIDER_SITE_OTHER): Payer: Medicare Other

## 2017-04-27 DIAGNOSIS — R002 Palpitations: Secondary | ICD-10-CM

## 2017-04-27 DIAGNOSIS — R0789 Other chest pain: Secondary | ICD-10-CM | POA: Diagnosis not present

## 2017-04-27 LAB — EXERCISE TOLERANCE TEST
CHL CUP MPHR: 154 {beats}/min
CHL CUP RESTING HR STRESS: 83 {beats}/min
CSEPHR: 96 %
Estimated workload: 4.9 METS
Exercise duration (min): 3 min
Exercise duration (sec): 17 s
Peak HR: 148 {beats}/min
RPE: 15

## 2017-07-13 ENCOUNTER — Telehealth: Payer: Self-pay | Admitting: Internal Medicine

## 2017-07-13 NOTE — Telephone Encounter (Signed)
Informed patient that we received her message about her appt on 6/20 and the fact she may be a little late due to another physician appt.  I let her know that we would make a note of this and that she should do the best she can.

## 2017-07-13 NOTE — Telephone Encounter (Signed)
New Message   Pt states she was contacted by her mammogram doctor and she has to go in tomorrow for her results so she might be a little late for her appt at 10:20. Informed of the 20 minute window

## 2017-07-14 ENCOUNTER — Ambulatory Visit: Payer: Medicare Other | Admitting: Internal Medicine

## 2017-07-14 ENCOUNTER — Encounter: Payer: Self-pay | Admitting: Internal Medicine

## 2017-07-14 VITALS — BP 126/80 | HR 73 | Ht 64.0 in | Wt 169.0 lb

## 2017-07-14 DIAGNOSIS — R0789 Other chest pain: Secondary | ICD-10-CM

## 2017-07-14 DIAGNOSIS — E7800 Pure hypercholesterolemia, unspecified: Secondary | ICD-10-CM | POA: Diagnosis not present

## 2017-07-14 DIAGNOSIS — R002 Palpitations: Secondary | ICD-10-CM | POA: Diagnosis not present

## 2017-07-14 MED ORDER — ROSUVASTATIN CALCIUM 5 MG PO TABS: ORAL_TABLET | ORAL | 3 refills | Status: DC

## 2017-07-14 NOTE — Progress Notes (Signed)
Follow-up Outpatient Visit Date: 07/14/2017  Primary Care Provider: [facility] At [address] [place]  [address]  Chief Complaint: Follow-up chest pain and palpitations  HPI:  Ms. Melissa [name] is a 67 y.o. year-old female with history of remote PE, solitary kidney, IBS, GERD, diverticulosis, and anxiety/panic attacks, who presents for follow-up of chest pain and palpitations.  I met her in March, at which time she reported at least 2 episodes of palpitations with accompanying left-sided chest pain and shortness of breath.  Symptoms ultimately responded with lorazepam and GI cocktail.  We agreed to perform an exercise tolerance test, which showed no evidence of ischemic EKG changes, though exercise capacity was quite poor.  Today, Ms. Slattery is upset, as she was told this morning that she has a suspicious area in her right breast and will need to undergo biopsy in early July.  She has not had any further chest pain shortness of breath.  She has occasional palpitations, which are brief and without accompanying symptoms.  She has not had any orthopnea, PND, or edema.  She notes weight gain due to diet and lack of exercise over the last year.  --------------------------------------------------------------------------------------------------  Cardiovascular History & Procedures: Cardiovascular Problems:  Chest pain  Pulmonary embolism  Risk Factors:  Age  Cath/PCI:  None  CV Surgery:  None  EP Procedures and Devices:  None  Non-Invasive Evaluation(s):  Exercise tolerance test (04/27/2017): No evidence of ischemia.  Poor exercise capacity (exercisee 3:17 minutes).  TTE (03/19/16, [facility]): Normal LV size and wall thickness.  LVEF 60-65% with abnormal diastolic function.  Normal RV size and function.  No significant valvular abnormalities.  Recent CV Pertinent Labs: Lab Results  Component Value Date   CHOL 248 (H) 03/26/2017     HDL 64 03/26/2017   LDLCALC 173 (H) 03/26/2017   TRIG 57 03/26/2017   CHOLHDL 3.9 03/26/2017   INR 0.96 03/26/2017   BNP 173.3 (H) 03/26/2017   K 3.5 03/26/2017   MG 1.7 03/26/2017   BUN 16 03/26/2017   CREATININE 0.73 03/26/2017    Past medical and surgical history were reviewed and updated in EPIC.  Current Meds  Medication Sig  . acetaminophen (TYLENOL) 500 MG tablet Take 1,000 mg by mouth every 6 (six) hours as needed for mild pain.  Marland Kitchen ALPRAZolam (XANAX) 0.25 MG tablet TAKE 1/2 TABLET IN THE MORNING AND 1/2 IN THE EVENING AT BEDTIME  . hydrocortisone cream 1 % Apply 1 application topically as needed for itching.  . hyoscyamine (ANASPAZ) 0.125 MG TBDP disintergrating tablet Take 0.125 mg by mouth every 6 (six) hours as needed.  Marland Kitchen omeprazole (PRILOSEC) 20 MG capsule Take 20 mg by mouth 2 (two) times daily before a meal.    Allergies: Clindamycin  Social History   Tobacco Use  . Smoking status: Former Smoker    Packs/day: 20.00    Years: 1.00    Pack years: 20.00    Types: Cigarettes    Last attempt to quit: 1985    Years since quitting: 34.4  . Smokeless tobacco: Never Used  Substance Use Topics  . Alcohol use: Never    Frequency: Never  . Drug use: Never    Family History  Problem Relation Age of Onset  . Alzheimer's disease Mother   . Stroke Father 51  . Heart attack Maternal Grandmother   . Heart attack Paternal Grandmother     Review of Systems: A 12-system review of systems was performed  and was negative except as noted in the HPI.  --------------------------------------------------------------------------------------------------  Physical Exam: BP 126/80   Pulse 73   Ht _0  (1.626 m)   Wt 169 lb (76.7 kg)   SpO2 96%   BMI 29.01 kg/m   General: NAD. HEENT: No conjunctival pallor or scleral icterus. Moist mucous membranes.  OP clear. Neck: Supple without lymphadenopathy, thyromegaly, JVD, or HJR. Lungs: Normal work of breathing. Clear to  auscultation bilaterally without wheezes or crackles. Heart: Regular rate and rhythm without murmurs, rubs, or gallops. Non-displaced PMI. Abd: Bowel sounds present. Soft, NT/ND without hepatosplenomegaly Ext: No lower extremity edema. Radial, PT, and DP pulses are 2+ bilaterally. Skin: Warm and dry without rash.  Lab Results  Component Value Date   WBC 18.5 (H) 03/26/2017   HGB 14.6 03/26/2017   HCT 41.1 03/26/2017   MCV 98.1 03/26/2017   PLT 203 03/26/2017    Lab Results  Component Value Date   NA 139 03/26/2017   K 3.5 03/26/2017   CL 104 03/26/2017   CO2 24 03/26/2017   BUN 16 03/26/2017   CREATININE 0.73 03/26/2017   GLUCOSE 103 (H) 03/26/2017   ALT 28 03/26/2017    Lab Results  Component Value Date   CHOL 248 (H) 03/26/2017   HDL 64 03/26/2017   LDLCALC 173 (H) 03/26/2017   TRIG 57 03/26/2017   CHOLHDL 3.9 03/26/2017    --------------------------------------------------------------------------------------------------  ASSESSMENT AND PLAN: Atypical chest pain No recurrence.  Exercise tolerance test showed no ischemic changes, though exercise capacity was very limited.  This limits the sensitivity and specificity of the study.  However, given no further chest pain, and upcoming work-up of right breast mass, I do not recommend any further cardiac testing at this time.  I have encouraged continued primary prevention including lipid therapy, as outlined below.  Palpitations Occasional and self-limited without accompanying symptoms.  No further testing at this time.  If symptoms become more frequent, ambulatory cardiac monitoring can be considered.  Hyperlipidemia LDL noted to be quite elevated at 173 in March.  Ms. Brackeen reports previously having been on a statin but having to stop this due to myalgias.  She does not recall which medication it was.  I recommended starting rosuvastatin 5 mg on Mondays, Wednesdays, and Fridays.  Ms. Fassler will think about this and  initiate the medication if she is willing to proceed.  We will need to repeat a lipid panel and ALT in about 3 months.  Follow-up: Return to clinic in 6 months.  Nelva Bush, MD 07/14/2017 10:43 AM

## 2017-07-14 NOTE — Patient Instructions (Addendum)
Medication Instructions:  START Rosuvastatin 5 mg by mouth M-W-F  -- If you need a refill on your cardiac medications before your next appointment, please call your pharmacy. --  Labwork: 3 MONTHS  ALT FASTING LIPID PROFILE   Testing/Procedures: None ordered  Follow-Up: Your physician wants you to follow-up in: 6 MONTHS with Dr. Saunders Revel.     Thank you for choosing [facility]!!    Any Other Special Instructions Will Be Listed Below (If Applicable).

## 2017-07-19 ENCOUNTER — Other Ambulatory Visit: Payer: Self-pay | Admitting: Radiology

## 2017-07-21 ENCOUNTER — Telehealth: Payer: Self-pay | Admitting: Oncology

## 2017-07-21 NOTE — Telephone Encounter (Signed)
SPOKE WITH patient to confirm morning BC appointment on 7/3, [facility] patient no packet sent

## 2017-07-22 ENCOUNTER — Encounter: Payer: Self-pay | Admitting: *Deleted

## 2017-07-22 DIAGNOSIS — C50411 Malignant neoplasm of upper-outer quadrant of right female breast: Secondary | ICD-10-CM | POA: Insufficient documentation

## 2017-07-22 DIAGNOSIS — Z170 Estrogen receptor positive status [ER+]: Principal | ICD-10-CM

## 2017-07-25 DIAGNOSIS — C801 Malignant (primary) neoplasm, unspecified: Secondary | ICD-10-CM

## 2017-07-25 HISTORY — DX: Malignant (primary) neoplasm, unspecified: C80.1

## 2017-07-26 NOTE — Progress Notes (Signed)
Melissa [name]  [phone] [phone]     ID: Melissa [name] DOB: 04-29-1950  MR#: [id number]  EVO#:[id number]  Patient Care Team: [facility] as PCP - General (Family Medicine) End, Harrell Gave, MD as PCP - Cardiology (Cardiology) Erroll Luna, MD as Consulting Physician (General Surgery) Taquilla Downum, Virgie Dad, MD as Consulting Physician (Oncology) Kyung Rudd, MD as Consulting Physician (Radiation Oncology) Christain Sacramento, MD as Consulting Physician (Family Medicine) Aletha Halim., PA-C as Physician Assistant (Family Medicine) Richmond Campbell, MD as Consulting Physician (Gastroenterology) OTHER MD:    CHIEF COMPLAINT: Estrogen receptor positive breast cancer  CURRENT TREATMENT: Awaiting definitive surgery   HISTORY OF CURRENT ILLNESS: Melissa [name] had routine screening bilateral mammography with tomography at [facility] on 07/12/2017 showing; breast density category C. There was a possible mass in the right breast. On 07/14/2017 she completed unilateral right ultrasonography at [facility] that showed an irregular hypoechoic 1.6 cm mass in the right upper outer quadrant and posterior depth. Evaluation of the right axilla showed 4 lymph nodes with cortical thickening suspicious of malignancy.   Accordingly on 07/19/2017 she proceeded to biopsy of the right breast area and 1 lymph node in question. 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  The lymph node was biopsied showing no evidence of carcinoma, which was found to be concordant.  Prognostic indicators significant for: estrogen receptor, 95% positive and progesterone receptor, 95% positive, both with strong staining intensity. Proliferation marker Ki67 at 10% . HER2 not amplified with ratios HER2/CEP17 signals 1.22 and average HER2 copies per cell 1.95  The patientFord subsequent  history is as detailed below.  INTERVAL HISTORY: Melissa [name] was evaluated in the multidisciplinary breast cancer clinic on 07/27/2017 accompanied by her daughter. The patientFord case was also presented at the multidisciplinary breast cancer conference on the same day. At that time a preliminary plan was proposed: Breast conserving surgery, Oncotype, adjuvant radiation, and adjuvant antiestrogens   REVIEW OF SYSTEMS: There were no specific symptoms leading to the original mammogram, which was routinely scheduled.  The patient denies unusual headaches, visual changes, nausea, vomiting, stiff neck, dizziness, or gait imbalance. There has been no cough, phlegm production, or pleurisy, no chest pain or pressure, and no change in bowel or bladder habits. The patient denies fever, rash, bleeding, unexplained fatigue or unexplained weight loss. A detailed review of systems was otherwise entirely negative.   PAST MEDICAL HISTORY: Past Medical History:  Diagnosis Date  . Anxiety   . Chest pain   . Diverticulosis   . GERD (gastroesophageal reflux disease)   . High cholesterol   . IBS (irritable bowel syndrome)   . Kidney disorder    Pt states only has left kidney  . Panic attacks   . Pulmonary embolism ([place]) 2018   pt states resolved and no longer takes blood thinners  Small pulmonary emboli - eliquis for 6 months- resolved.  Depressive and anxiety moods - xanax One functioning Kidney  PAST SURGICAL HISTORY: Past Surgical History:  Procedure Laterality Date  . TUBAL LIGATION      FAMILY HISTORY Family History  Problem Relation Age of Onset  . [name] disease Mother   . Stroke Father 56  . Heart attack Maternal Grandmother   . Heart attack Paternal Grandmother   . Cervical cancer Cousin   . Colon cancer Paternal Aunt   . Colon cancer Cousin   The patientFord father died at  age 63 due to CHF. The patientFord mother died at age 30 due to [name]. The patient has 1 brother and 4 sisters.  There was a paternal 1st cousin diagnosed with cervical cancer at age 97. There was a paternal aunt diagnosed with colon cancer at age 65. There was a paternal 1st cousin diagnosed with colon cancer at age 35. The patient denies a family history of breast or ovarian cancer.    GYNECOLOGIC HISTORY:  No LMP recorded. Menarche: 67 years old Age at first live birth: 67 years old She is GX P1. Her LMP was in 2008. She used oral contraception with no complications. She did not used HRT.    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 Her husband, [name], died in 2016/12/11 due to pancreatic cancer. He was a Furniture conservator/restorer.  The past year has been accordingly very difficult for the patient.  The patientFord daughter, Melissa [name] lives in [place] and works at [employer].      ADVANCED DIRECTIVES: Not in place. The patient plans to name her daughter, Melissa [name] as her HCPOA. The appropriate forms were given at the 07/27/2017 visit.    HEALTH MAINTENANCE: Social History   Tobacco Use  . Smoking status: Former Smoker    Packs/day: 20.00    Years: 1.00    Pack years: 20.00    Types: Cigarettes    Last attempt to quit: 1985    Years since quitting: 34.5  . Smokeless tobacco: Never Used  Substance Use Topics  . Alcohol use: Never    Frequency: Never  . Drug use: Never     Colonoscopy: Feb. 2019/ [facility]/ diverticulosis  PAP: Nov. 2018  Bone density: Nov. 2018 / osteopenia   Allergies  Allergen Reactions  . Clindamycin Diarrhea and Nausea Only    Current Outpatient Medications  Medication Sig Dispense Refill  . acetaminophen (TYLENOL) 500 MG tablet Take 1,000 mg by mouth every 6 (six) hours as needed for mild pain.    Marland Kitchen ALPRAZolam (XANAX) 0.25 MG tablet TAKE 1/2 TABLET IN THE MORNING AND 1/2 IN THE EVENING AT BEDTIME    . calcium carbonate (TUMS - DOSED IN MG ELEMENTAL CALCIUM) 500 MG chewable tablet Chew 1 tablet by mouth 2 (two) times daily. 600 mg twice a day     . hydrocortisone cream 1 % Apply 1 application topically as needed for itching.    . hyoscyamine (ANASPAZ) 0.125 MG TBDP disintergrating tablet Take 0.125 mg by mouth every 6 (six) hours as needed.  3  . omeprazole (PRILOSEC) 20 MG capsule Take 20 mg by mouth 2 (two) times daily before a meal.    . rosuvastatin (CRESTOR) 5 MG tablet Take 5 mg 1 tablet on Monday-Wednesday-Friday (Patient not taking: Reported on 07/27/2017) 90 tablet 3   No current facility-administered medications for this visit.     OBJECTIVE: Middle-aged white woman who appears stated age  67:   07/27/17 0853  BP: 137/66  Pulse: 86  Resp: 19  Temp: 97.8 F (36.6 C)  SpO2: 98%     Body mass index is 28.96 kg/m.   Wt Readings from Last 3 Encounters:  07/27/17 168 lb 11.2 oz (76.5 kg)  07/14/17 169 lb (76.7 kg)  04/18/17 166 lb 12.8 oz (75.7 kg)      ECOG FS:0 - Asymptomatic  Ocular: Sclerae unicteric, pupils round and equal Ear-nose-throat: Oropharynx clear and moist Lymphatic: No cervical or supraclavicular adenopathy Lungs no rales or rhonchi Heart  regular rate and rhythm Abd soft, nontender, positive bowel sounds MSK no focal spinal tenderness, no joint edema Neuro: non-focal, well-oriented, somewhat flat and depressed affect but overall appropriatet Breasts: The right breast is status post recent biopsy.  There are no skin or nipple changes of concern.  The left breast is benign.  Both axillae are benign.   LAB RESULTS:  CMP     Component Value Date/Time   NA 140 07/27/2017 0839   K 4.0 07/27/2017 0839   CL 104 07/27/2017 0839   CO2 28 07/27/2017 0839   GLUCOSE 105 (H) 07/27/2017 0839   BUN 17 07/27/2017 0839   CREATININE 0.84 07/27/2017 0839   CALCIUM 10.1 07/27/2017 0839   PROT 7.4 07/27/2017 0839   ALBUMIN 4.2 07/27/2017 0839   AST 18 07/27/2017 0839   ALT 19 07/27/2017 0839   ALKPHOS 70 07/27/2017 0839   BILITOT 0.5 07/27/2017 0839   GFRNONAA >60 07/27/2017 0839   GFRAA >60  07/27/2017 0839    No results found for: TOTALPROTELP, ALBUMINELP, A1GS, A2GS, BETS, BETA2SER, GAMS, MSPIKE, SPEI  No results found for: KPAFRELGTCHN, LAMBDASER, [facility]  Lab Results  Component Value Date   WBC 7.5 07/27/2017   NEUTROABS 4.3 07/27/2017   HGB 13.5 07/27/2017   HCT 40.0 07/27/2017   MCV 92.8 07/27/2017   PLT 281 07/27/2017    @LASTCHEMISTRY @  No results found for: LABCA2  No components found for: [id number]  No results for input(s): INR in the last 168 hours.  No results found for: LABCA2  No results found for: NWG956  No results found for: OZH086  No results found for: VHQ469  No results found for: CA2729  No components found for: HGQUANT  No results found for: CEA1 / No results found for: CEA1   No results found for: AFPTUMOR  No results found for: CHROMOGRNA  No results found for: PSA1  Appointment on 07/27/2017  Component Date Value Ref Range Status  . Sodium 07/27/2017 140  135 - 145 mmol/L Final   Please note reference intervals were recently updated.  . Potassium 07/27/2017 4.0  3.5 - 5.1 mmol/L Final  . Chloride 07/27/2017 104  98 - 111 mmol/L Final  . CO2 07/27/2017 28  22 - 32 mmol/L Final  . Glucose, Bld 07/27/2017 105* 70 - 99 mg/dL Final  . BUN 07/27/2017 17  8 - 23 mg/dL Final   Please note change in reference range.  . Creatinine 07/27/2017 0.84  0.44 - 1.00 mg/dL Final  . Calcium 07/27/2017 10.1  8.9 - 10.3 mg/dL Final  . Total Protein 07/27/2017 7.4  6.5 - 8.1 g/dL Final  . Albumin 07/27/2017 4.2  3.5 - 5.0 g/dL Final  . AST 07/27/2017 18  15 - 41 U/L Final  . ALT 07/27/2017 19  0 - 44 U/L Final  . Alkaline Phosphatase 07/27/2017 70  38 - 126 U/L Final  . Total Bilirubin 07/27/2017 0.5  0.3 - 1.2 mg/dL Final  . GFR, Est Non Af Am 07/27/2017 >60  >60 mL/min Final  . GFR, Est AFR Am 07/27/2017 >60  >60 mL/min Final   Comment: (NOTE) The eGFR has been calculated using the CKD EPI equation. This calculation has not  been validated in all clinical situations. eGFRFord persistently <60 mL/min signify possible Chronic Kidney Disease.   Georgiann Hahn gap 07/27/2017 8  5 - 15 Final   Performed at [facility], [place] [address] [place], [place] [address]  . WBC  Count 07/27/2017 7.5  3.9 - 10.3 K/uL Final  . RBC 07/27/2017 4.31  3.70 - 5.45 MIL/uL Final  . Hemoglobin 07/27/2017 13.5  11.6 - 15.9 g/dL Final  . HCT 07/27/2017 40.0  34.8 - 46.6 % Final  . MCV 07/27/2017 92.8  79.5 - 101.0 fL Final  . MCH 07/27/2017 31.3  25.1 - 34.0 pg Final  . MCHC 07/27/2017 33.8  31.5 - 36.0 g/dL Final  . RDW 07/27/2017 13.0  11.2 - 14.5 % Final  . Platelet Count 07/27/2017 281  145 - 400 K/uL Final  . Neutrophils Relative % 07/27/2017 57  % Final  . Neutro Abs 07/27/2017 4.3  1.5 - 6.5 K/uL Final  . Lymphocytes Relative 07/27/2017 32  % Final  . Lymphs Abs 07/27/2017 2.4  0.9 - 3.3 K/uL Final  . Monocytes Relative 07/27/2017 9  % Final  . Monocytes Absolute 07/27/2017 0.7  0.1 - 0.9 K/uL Final  . Eosinophils Relative 07/27/2017 1  % Final  . Eosinophils Absolute 07/27/2017 0.1  0.0 - 0.5 K/uL Final  . Basophils Relative 07/27/2017 1  % Final  . Basophils Absolute 07/27/2017 0.0  0.0 - 0.1 K/uL Final   Performed at [facility], [address] [place], [place] [address]    (this displays the last labs from the last 3 days)  No results found for: TOTALPROTELP, ALBUMINELP, A1GS, A2GS, BETS, BETA2SER, GAMS, MSPIKE, SPEI (this displays SPEP labs)  No results found for: KPAFRELGTCHN, LAMBDASER, KAPLAMBRATIO (kappa/lambda light chains)  No results found for: HGBA, HGBA2QUANT, HGBFQUANT, HGBSQUAN (Hemoglobinopathy evaluation)   No results found for: LDH  No results found for: IRON, TIBC, IRONPCTSAT (Iron and TIBC)  No results found for: FERRITIN  Urinalysis    Component Value Date/Time   COLORURINE STRAW (A) 03/26/2017 [place] 03/26/2017 0724    LABSPEC 1.012 03/26/2017 0724   PHURINE 7.0 03/26/2017 0724   GLUCOSEU NEGATIVE 03/26/2017 0724   HGBUR SMALL (A) 03/26/2017 0724   BILIRUBINUR NEGATIVE 03/26/2017 0724   KETONESUR NEGATIVE 03/26/2017 0724   PROTEINUR NEGATIVE 03/26/2017 0724   NITRITE NEGATIVE 03/26/2017 0724   LEUKOCYTESUR NEGATIVE 03/26/2017 0724     STUDIES: Outside studies reviewed with the patient  ELIGIBLE FOR AVAILABLE RESEARCH PROTOCOL: no  ASSESSMENT: 67 y.o. [place], [place] woman status post right breast upper outer quadrant biopsy 07/19/2017 for a clinical T1c NX, stage IA invasive ductal carcinoma, grade 2, estrogen and progesterone receptor positive, HER-2 not amplified, with an MIB-1 of 10%  (a) 1 of 4 suspicious axillary lymph nodes biopsied 07/19/2017 was negative  (1) breast conserving surgery with sentinel lymph node sampling pending  (2) Oncotype to be obtained from the definitive surgical sample  (3) adjuvant radiation to follow  (4) antiestrogens to follow at the completion of local treatment  PLAN: We spent the better part of todayFord hour-long appointment discussing the biology of her diagnosis and the specifics of her situation. We first reviewed the fact that cancer is not one disease but more than 100 different diseases and that it is important to keep them separate-- otherwise when friends and relatives discuss their own cancer experiences with [place] confusion can result. Similarly we explained that if breast cancer spreads to the bone or liver, the patient would not have bone cancer or liver cancer, but breast cancer in the bone and breast cancer in the liver: one cancer in three places-- not 3 different cancers which otherwise would have to be  treated in 3 different ways.  We discussed the difference between local and systemic therapy. In terms of loco-regional treatment, lumpectomy plus radiation is equivalent to mastectomy as far as survival is concerned. For this reason, and because the  cosmetic results are generally superior, we recommend breast conserving surgery.   We then discussed the rationale for systemic therapy. There is some risk that this cancer may have already spread to other parts of her body. Patients frequently ask at this point about bone scans, CAT scans and PET scans to find out if they have occult breast cancer somewhere else. The problem is that in early stage disease we are much more likely to find false positives then true cancers and this would expose the patient to unnecessary procedures as well as unnecessary radiation. Scans cannot answer the question the patient really would like to know, which is whether she has microscopic disease elsewhere in her body. For those reasons we do not recommend them.  Of course we would proceed to aggressive evaluation of any symptoms that might suggest metastatic disease, but that is not the case here.  Next we went over the options for systemic therapy which are anti-estrogens, anti-HER-2 immunotherapy, and chemotherapy. Melissa [name] does not meet criteria for anti-HER-2 immunotherapy. She is a good candidate for anti-estrogens.  The question of chemotherapy is more complicated. Chemotherapy is most effective in rapidly growing, aggressive tumors. It is much less effective in low-grade, slow growing cancers, like Melissa [name]. For that reason we are going to request an Oncotype from the definitive surgical sample, as suggested by NCCN guidelines. That will help [place] make a definitive decision regarding chemotherapy in this case.  The overall plan then is to start with surgery, obtain an Oncotype, then follow-up with chemotherapy as appropriate, radiation and antiestrogens  Eboni has a good understanding of the overall plan. She agrees with it. She knows the goal of treatment in her case is cure. She will call with any problems that may develop before her next visit here.   Vihaan Gloss, Virgie Dad, MD  07/27/17 12:34 PM Medical Oncology  and Hematology [facility] [address] [place], [place] [address] Tel. [phone]    Fax. [phone]  Alice Rieger, am acting as scribe for Chauncey Cruel MD.  I, Lurline Del MD, have reviewed the above documentation for accuracy and completeness, and I agree with the above.

## 2017-07-27 ENCOUNTER — Other Ambulatory Visit: Payer: Self-pay

## 2017-07-27 ENCOUNTER — Encounter: Payer: Self-pay | Admitting: Physical Therapy

## 2017-07-27 ENCOUNTER — Inpatient Hospital Stay: Payer: Medicare Other

## 2017-07-27 ENCOUNTER — Inpatient Hospital Stay: Payer: Medicare Other | Attending: Oncology | Admitting: Oncology

## 2017-07-27 ENCOUNTER — Encounter: Payer: Self-pay | Admitting: Radiation Oncology

## 2017-07-27 ENCOUNTER — Telehealth: Payer: Self-pay | Admitting: Oncology

## 2017-07-27 ENCOUNTER — Ambulatory Visit
Admission: RE | Admit: 2017-07-27 | Discharge: 2017-07-27 | Disposition: A | Discharge status: Home / Self Care | Payer: Medicare Other | Source: Ambulatory Visit | Attending: Radiation Oncology | Admitting: Radiation Oncology

## 2017-07-27 ENCOUNTER — Ambulatory Visit: Payer: Self-pay | Admitting: Surgery

## 2017-07-27 ENCOUNTER — Encounter: Payer: Self-pay | Admitting: Oncology

## 2017-07-27 ENCOUNTER — Ambulatory Visit: Payer: Medicare Other | Attending: Surgery | Admitting: Physical Therapy

## 2017-07-27 VITALS — BP 137/66 | HR 86 | Temp 97.8°F | Resp 19 | Ht 64.0 in | Wt 168.7 lb

## 2017-07-27 DIAGNOSIS — Z86711 Personal history of pulmonary embolism: Secondary | ICD-10-CM | POA: Insufficient documentation

## 2017-07-27 DIAGNOSIS — R293 Abnormal posture: Secondary | ICD-10-CM | POA: Diagnosis present

## 2017-07-27 DIAGNOSIS — Z87891 Personal history of nicotine dependence: Secondary | ICD-10-CM | POA: Insufficient documentation

## 2017-07-27 DIAGNOSIS — C50411 Malignant neoplasm of upper-outer quadrant of right female breast: Secondary | ICD-10-CM | POA: Diagnosis not present

## 2017-07-27 DIAGNOSIS — Z170 Estrogen receptor positive status [ER+]: Principal | ICD-10-CM

## 2017-07-27 DIAGNOSIS — C50911 Malignant neoplasm of unspecified site of right female breast: Secondary | ICD-10-CM

## 2017-07-27 DIAGNOSIS — Z800 Family history of malignant neoplasm of digestive organs: Secondary | ICD-10-CM | POA: Insufficient documentation

## 2017-07-27 DIAGNOSIS — Z79899 Other long term (current) drug therapy: Secondary | ICD-10-CM | POA: Diagnosis not present

## 2017-07-27 DIAGNOSIS — M8580 Other specified disorders of bone density and structure, unspecified site: Secondary | ICD-10-CM | POA: Insufficient documentation

## 2017-07-27 DIAGNOSIS — I2699 Other pulmonary embolism without acute cor pulmonale: Secondary | ICD-10-CM | POA: Insufficient documentation

## 2017-07-27 DIAGNOSIS — F419 Anxiety disorder, unspecified: Secondary | ICD-10-CM | POA: Diagnosis not present

## 2017-07-27 DIAGNOSIS — I2782 Chronic pulmonary embolism: Secondary | ICD-10-CM

## 2017-07-27 LAB — CBC WITH DIFFERENTIAL (CANCER CENTER ONLY)
Basophils Absolute: 0 10*3/uL (ref 0.0–0.1)
Basophils Relative: 1 %
EOS PCT: 1 %
Eosinophils Absolute: 0.1 10*3/uL (ref 0.0–0.5)
HCT: 40 % (ref 34.8–46.6)
Hemoglobin: 13.5 g/dL (ref 11.6–15.9)
LYMPHS ABS: 2.4 10*3/uL (ref 0.9–3.3)
LYMPHS PCT: 32 %
MCH: 31.3 pg (ref 25.1–34.0)
MCHC: 33.8 g/dL (ref 31.5–36.0)
MCV: 92.8 fL (ref 79.5–101.0)
MONO ABS: 0.7 10*3/uL (ref 0.1–0.9)
Monocytes Relative: 9 %
Neutro Abs: 4.3 10*3/uL (ref 1.5–6.5)
Neutrophils Relative %: 57 %
PLATELETS: 281 10*3/uL (ref 145–400)
RBC: 4.31 MIL/uL (ref 3.70–5.45)
RDW: 13 % (ref 11.2–14.5)
WBC Count: 7.5 10*3/uL (ref 3.9–10.3)

## 2017-07-27 LAB — CMP (CANCER CENTER ONLY)
ALBUMIN: 4.2 g/dL (ref 3.5–5.0)
ALT: 19 U/L (ref 0–44)
AST: 18 U/L (ref 15–41)
Alkaline Phosphatase: 70 U/L (ref 38–126)
Anion gap: 8 (ref 5–15)
BILIRUBIN TOTAL: 0.5 mg/dL (ref 0.3–1.2)
BUN: 17 mg/dL (ref 8–23)
CO2: 28 mmol/L (ref 22–32)
Calcium: 10.1 mg/dL (ref 8.9–10.3)
Chloride: 104 mmol/L (ref 98–111)
Creatinine: 0.84 mg/dL (ref 0.44–1.00)
GFR, Est AFR Am: >60 mL/min (ref >60)
GFR, Estimated: >60 mL/min (ref >60)
Glucose, Bld: 105 mg/dL — ABNORMAL HIGH (ref 70–99)
POTASSIUM: 4 mmol/L (ref 3.5–5.1)
Sodium: 140 mmol/L (ref 135–145)
TOTAL PROTEIN: 7.4 g/dL (ref 6.5–8.1)

## 2017-07-27 NOTE — Patient Instructions (Signed)

## 2017-07-27 NOTE — Progress Notes (Signed)
Radiation Oncology         [phone] ________________________________  Name: Theodore Rahrig        MRN: [id number]  Date of Service: 07/27/2017 DOB: [birth year]  XL:[id number], [facility]  Erroll Luna, MD     REFERRING PHYSICIAN: Erroll Luna, MD   DIAGNOSIS: The encounter diagnosis was Malignant neoplasm of upper-outer quadrant of right breast in female, estrogen receptor positive ([place]).   HISTORY OF PRESENT ILLNESS: Bayyinah Dukeman is a 67 y.o. female seen in the multidisciplinary breast clinic for a new diagnosis of right breast cancer. The patient was noted to have a mass seen on screening mammogram in the right upper outer quadrant. She had additional diagnostic imaging including ultrasound which located the mass at 10:00 and measured 1.6 cm in greatest dimension. She had three nodes visible by ultrasound and one of the three had increased cortical thickening. A biopsy on 07/14/17 revealed a grade 2, invasive ductal carcinoma of the breast. The tumor was ER/PR positive, HER2 negative, Ki 67 of 10%. Her sampled node seen on ultrasound was negative for disease. She comes today to discuss options of treatment for her cancer.    PREVIOUS RADIATION THERAPY: No   PAST MEDICAL HISTORY:  Past Medical History:  Diagnosis Date  . Anxiety   . Chest pain   . Diverticulosis   . GERD (gastroesophageal reflux disease)   . IBS (irritable bowel syndrome)   . Kidney disorder    Pt states only has left kidney  . Panic attacks   . Pulmonary embolism ([place]) 2018   pt states resolved and no longer takes blood thinners       PAST SURGICAL HISTORY: Past Surgical History:  Procedure Laterality Date  . TUBAL LIGATION       FAMILY HISTORY:  Family History  Problem Relation Age of Onset  . Alzheimer's disease Mother   . Stroke Father 59  . Heart attack Maternal Grandmother   . Heart attack Paternal Grandmother      SOCIAL HISTORY:  reports that she quit  smoking about 34 years ago. Her smoking use included cigarettes. She has a 20.00 pack-year smoking history. She has never used smokeless tobacco. She reports that she does not drink alcohol or use drugs.  The patient is widowed and lives in [place]. Her husband was diagnosed with and passed from pancreatic cancer in the last year.   ALLERGIES: Clindamycin   MEDICATIONS:  Current Outpatient Medications  Medication Sig Dispense Refill  . acetaminophen (TYLENOL) 500 MG tablet Take 1,000 mg by mouth every 6 (six) hours as needed for mild pain.    Marland Kitchen ALPRAZolam (XANAX) 0.25 MG tablet TAKE 1/2 TABLET IN THE MORNING AND 1/2 IN THE EVENING AT BEDTIME    . hydrocortisone cream 1 % Apply 1 application topically as needed for itching.    . hyoscyamine (ANASPAZ) 0.125 MG TBDP disintergrating tablet Take 0.125 mg by mouth every 6 (six) hours as needed.  3  . omeprazole (PRILOSEC) 20 MG capsule Take 20 mg by mouth 2 (two) times daily before a meal.    . rosuvastatin (CRESTOR) 5 MG tablet Take 5 mg 1 tablet on Monday-Wednesday-Friday 90 tablet 3   No current facility-administered medications for this encounter.      REVIEW OF SYSTEMS: On review of systems, the patient reports that she is doing well overall. She denies any chest pain, shortness of breath, cough, fevers, chills, night sweats, unintended weight changes. She denies any bowel  or bladder disturbances, and denies abdominal pain, nausea or vomiting. She denies any new musculoskeletal or joint aches or pains. A complete review of systems is obtained and is otherwise negative.     PHYSICAL EXAM:  Wt Readings from Last 3 Encounters:  07/14/17 169 lb (76.7 kg)  04/18/17 166 lb 12.8 oz (75.7 kg)  03/27/17 162 lb 3.2 oz (73.6 kg)   Temp Readings from Last 3 Encounters:  03/27/17 98 F (36.7 C) (Oral)   BP Readings from Last 3 Encounters:  07/14/17 126/80  04/18/17 (!) 142/72  03/27/17 132/69   Pulse Readings from Last 3 Encounters:    07/14/17 73  04/18/17 74  03/27/17 65     In general this is a well appearing caucasian female in no acute distress. She is alert and oriented x4 and appropriate throughout the examination. HEENT reveals that the patient is normocephalic, atraumatic. EOMs are intact. PERRLA. Skin is intact without any evidence of gross lesions. Cardiovascular exam reveals a regular rate and rhythm, no clicks rubs or murmurs are auscultated. Chest is clear to auscultation bilaterally. Lymphatic assessment is performed and does not reveal any adenopathy in the cervical, supraclavicular, axillary, or inguinal chains. Bilateral breast exam is performed and reveals fullness deep to the biopsy site on the right breast. No palpable mass is noted in the left breast. Neither breast reveals bleeding or discharge along the nipples. Abdomen has active bowel sounds in all quadrants and is intact. The abdomen is soft, non tender, non distended. Lower extremities are negative for pretibial pitting edema, deep calf tenderness, cyanosis or clubbing.   ECOG = 0  0 - Asymptomatic (Fully active, able to carry on all predisease activities without restriction)  1 - Symptomatic but completely ambulatory (Restricted in physically strenuous activity but ambulatory and able to carry out work of a light or sedentary nature. For example, light housework, office work)  2 - Symptomatic, <50% in bed during the day (Ambulatory and capable of all self care but unable to carry out any work activities. Up and about more than 50% of waking hours)  3 - Symptomatic, >50% in bed, but not bedbound (Capable of only limited self-care, confined to bed or chair 50% or more of waking hours)  4 - Bedbound (Completely disabled. Cannot carry on any self-care. Totally confined to bed or chair)  5 - Death   Eustace Pen MM, Creech RH, Tormey DC, et al. [phone]). "Toxicity and response criteria of the [facility] Group". [place] Oncol. 5 (6):  649-55    LABORATORY DATA:  Lab Results  Component Value Date   WBC 18.5 (H) 03/26/2017   HGB 14.6 03/26/2017   HCT 41.1 03/26/2017   MCV 98.1 03/26/2017   PLT 203 03/26/2017   Lab Results  Component Value Date   NA 139 03/26/2017   K 3.5 03/26/2017   CL 104 03/26/2017   CO2 24 03/26/2017   Lab Results  Component Value Date   ALT 28 03/26/2017   AST 26 03/26/2017   ALKPHOS 57 03/26/2017   BILITOT 0.7 03/26/2017      RADIOGRAPHY: No results found.     IMPRESSION/PLAN: 1. Stage IA, cT1cN0M0, grade 2 ER/PR positive invasive ductal carcinoma of the right breast. Dr. Lisbeth Renshaw discusses the pathology findings and reviews the nature of invasive breast disease. The consensus from the breast conference includes breast conservation with lumpectomy with sentinel node biopsy. Dr. Jana Hakim plans to proceed with Oncotype Dx score to determine if  there is a role for systemic therapy. Provided that chemotherapy is not indicated, the patient's course would then be followed by external radiotherapy to the breast followed by antiestrogen therapy. We discussed the risks, benefits, short, and long term effects of radiotherapy, and the patient is interested in proceeding. Dr. Lisbeth Renshaw discusses the delivery and logistics of radiotherapy and anticipates a course of 4 or 6 1/2 weeks of radiotherapy, and 4 weeks based on what we know today. We will see her back about 2 weeks after surgery to discuss the simulation process and anticipate we starting radiotherapy about 4-6 weeks after surgery.    The above documentation reflects my direct findings during this shared patient visit. Please see the separate note by Dr. Lisbeth Renshaw on this date for the remainder of the patient's plan of care.    Carola Rhine, PAC

## 2017-07-27 NOTE — H&P (View-Only) (Signed)
Melissa [name] Documented: 07/27/2017 7:35 AM Location: [facility] Patient #: [id number] DOB: 08-Nov-1950 Undefined / Language: [name] / Race: White Female  History of Present Illness Melissa Moores A. Melissa Stines MD; 07/27/2017 11:02 AM) Patient words: Pt sent at the request of Dr Melissa [name] for a mammogrphic abnomality of the right breast upper outer quadrant measuring 1.6 cm. Core bx shows IDC ER PR + HER 2 NEU NEGATIVE.Pt denies history of breast mass pain discharge or change in appearance of her breast.  The patient is a 67 year old female.   Past Surgical History Melissa Pummel, RN; 07/27/2017 7:36 AM) Breast Biopsy Right. multiple Oral Surgery  Diagnostic Studies History Melissa Pummel, RN; 07/27/2017 7:36 AM) Colonoscopy within last year Mammogram within last year  Medication History Melissa Pummel, RN; 07/27/2017 7:36 AM) Medications Reconciled  Social History Melissa Pummel, RN; 07/27/2017 7:36 AM) Alcohol use Occasional alcohol use. No drug use Tobacco use Former smoker.  Family History Melissa Pummel, RN; 07/27/2017 7:36 AM) Alcohol Abuse Family Members In General, Father. Anesthetic complications Sister. Arthritis Family Members In General, Father, Sister. Cerebrovascular Accident Father. Cervical Cancer Family Members In General. Colon Cancer Family Members In General. Colon Polyps Family Members In General. Depression Family Members In General, Mother. Diabetes Mellitus Sister. Heart Disease Family Members In General. Ischemic Bowel Disease Father. Respiratory Condition Father. Thyroid problems Sister.  Pregnancy / Birth History Melissa Pummel, RN; 07/27/2017 7:36 AM) Age of men[age] 51-55 Contraceptive History Oral contraceptives. Gravida 2 Irregular periods Maternal age 30-20  Other Problems Melissa Pummel, RN; 07/27/2017 7:36 AM) Anxiety Disorder Back Pain Breast Cancer Diverticulosis Gastroesophageal Reflux  Disease Hemorrhoids Hypercholesterolemia Lump In Breast Pulmonary Embolism / Blood Clot in Legs     Review of Systems Melissa Spillers Ledford RN; 07/27/2017 7:36 AM) General Present- Weight Gain. Not Present- Appetite Loss, Chills, Fatigue, Fever, Night Sweats and Weight Loss. Skin Present- Dryness and Rash. Not Present- Change in Wart/Mole, Hives, Jaundice, New Lesions, Non-Healing Wounds and Ulcer. HEENT Present- Sinus Pain and Wears glasses/contact lenses. Not Present- Earache, Hearing Loss, Hoarseness, Nose Bleed, Oral Ulcers, Ringing in the Ears, Seasonal Allergies, Sore Throat, Visual Disturbances and Yellow Eyes. Respiratory Present- Snoring. Not Present- Bloody sputum, Chronic Cough, Difficulty Breathing and Wheezing. Breast Not Present- Breast Mass, Breast Pain, Nipple Discharge and Skin Changes. Cardiovascular Not Present- Chest Pain, Difficulty Breathing Lying Down, Leg Cramps, Palpitations, Rapid Heart Rate, Shortness of Breath and Swelling of Extremities. Gastrointestinal Present- Gets full quickly at meals, Hemorrhoids and Indigestion. Not Present- Abdominal Pain, Bloating, Bloody Stool, Change in Bowel Habits, Chronic diarrhea, Constipation, Difficulty Swallowing, Excessive gas, Nausea, Rectal Pain and Vomiting. Female Genitourinary Not Present- Frequency, Nocturia, Painful Urination, Pelvic Pain and Urgency. Musculoskeletal Present- Joint Pain and Muscle Pain. Not Present- Back Pain, Joint Stiffness, Muscle Weakness and Swelling of Extremities. Neurological Not Present- Decreased Memory, Fainting, Headaches, Numbness, Seizures, Tingling, Tremor, Trouble walking and Weakness. Psychiatric Present- Anxiety and Change in Sleep Pattern. Not Present- Bipolar, Depression, Fearful and Frequent crying.   Physical Exam (Melissa Lefevers A. Melissa Belmonte MD; 07/27/2017 11:02 AM)  General Mental Status-Alert. General Appearance-Consistent with stated age. Hydration-Well  hydrated. Voice-Normal.  Eye Eyeball - Bilateral-Extraocular movements intact. Sclera/Conjunctiva - Bilateral-No scleral icterus.  Chest and Lung Exam Chest and lung exam reveals -quiet, even and easy respiratory effort with no use of accessory muscles and on auscultation, normal breath sounds, no adventitious sounds and normal vocal resonance. Inspection Chest Wall - Normal. Back - normal.  Breast Note: bruising right breast UOQ no mass left breast  normal  Cardiovascular Cardiovascular examination reveals -normal heart sounds, regular rate and rhythm with no murmurs and normal pedal pulses bilaterally.  Neurologic Neurologic evaluation reveals -alert and oriented x 3 with no impairment of recent or remote memory. Mental Status-Normal.  Lymphatic Head & Neck  General Head & Neck Lymphatics: Bilateral - Description - Normal. Axillary  General Axillary Region: Bilateral - Description - Normal. Tenderness - Non Tender.    Assessment & Plan (Melissa Iten A. Melissa Russett MD; 07/27/2017 11:04 AM)  BREAST CANCER, RIGHT (C50.911) Impression: discussed lumpectomy vs mastectomy pt good lumpectomy candidate plan on right lumpectomy and Risk of lumpectomy include bleeding, infection, seroma, more surgery, use of seed/wire, wound care, cosmetic deformity and the need for other treatments, death , blood clots, death. Pt agrees to proceed. Risk of sentinel lymph node mapping include bleeding, infection, lymphedema, shoulder pain. stiffness, dye allergy. cosmetic deformity , blood clots, death, need for more surgery. Pt agres to proceed. SLN mapping  Current Plans You are being scheduled for surgery- Our schedulers will call you.  You should hear from our office's scheduling department within 5 working days about the location, date, and time of surgery. We try to make accommodations for patient's preferences in scheduling surgery, but sometimes the OR schedule or the surgeon's schedule  prevents [place] from making those accommodations.  If you have not heard from our office [phone]) in 5 working days, call the office and ask for your surgeon's nurse.  If you have other questions about your diagnosis, plan, or surgery, call the office and ask for your surgeon's nurse.  We discussed the staging and pathophysiology of breast cancer. We discussed all of the different options for treatment for breast cancer including surgery, chemotherapy, radiation therapy, Herceptin, and antiestrogen therapy. We discussed a sentinel lymph node biopsy as she does not appear to having lymph node involvement right now. We discussed the performance of that with injection of radioactive tracer and blue dye. We discussed that she would have an incision underneath her axillary hairline. We discussed that there is a bout a 10-20% chance of having a positive node with a sentinel lymph node biopsy and we will await the permanent pathology to make any other first further decisions in terms of her treatment. One of these options might be to return to the operating room to perform an axillary lymph node dissection. We discussed about a 1-2% risk lifetime of chronic shoulder pain as well as lymphedema associated with a sentinel lymph node biopsy. We discussed the options for treatment of the breast cancer which included lumpectomy versus a mastectomy. We discussed the performance of the lumpectomy with a wire placement. We discussed a 10-20% chance of a positive margin requiring reexcision in the operating room. We also discussed that she may need radiation therapy or antiestrogen therapy or both if she undergoes lumpectomy. We discussed the mastectomy and the postoperative care for that as well. We discussed that there is no difference in her survival whether she undergoes lumpectomy with radiation therapy or antiestrogen therapy versus a mastectomy. There is a slight difference in the local recurrence rate being 3-5%  with lumpectomy and about 1% with a mastectomy. We discussed the risks of operation including bleeding, infection, possible reoperation. She understands her further therapy will be based on what her stages at the time of her operation.  Pt Education - Pamphlet Given - Breast Biopsy: discussed with patient and provided information. Pt Education - [facility] Breast Cancer Information Given - [facility] "Breast Journey"  Package Pt Education - flb breast cancer surgery: discussed with patient and provided information. Pt Education - [facility] Breast Biopsy HCI: discussed with patient and provided information. Pt Education - ABC (After Breast Cancer) Class Info: discussed with patient and provided information.

## 2017-07-27 NOTE — Telephone Encounter (Signed)
No 7/3 LOS, referrals or orders.

## 2017-07-27 NOTE — Therapy (Signed)
[place], [place], [address] Phone: [phone]   Fax:  [phone]  Physical Therapy Evaluation  Patient Details  Name: Melissa [name] MRN: [id number] Date of Birth: [birth year] Referring Provider: Dr. Erroll Luna   Encounter Date: 07/27/2017  PT End of Session - 07/27/17 1435    Visit Number  1    Number of Visits  2    Date for PT Re-Evaluation  09/21/17    PT Start Time  0953    PT Stop Time  1007 Also saw pt from 1119-1146 for a total of 41 minutes    PT Time Calculation (min)  14 min    Activity Tolerance  Patient tolerated treatment well    Behavior During Therapy  [facility] for tasks assessed/performed       Past Medical History:  Diagnosis Date  . Anxiety   . Chest pain   . Diverticulosis   . GERD (gastroesophageal reflux disease)   . High cholesterol   . IBS (irritable bowel syndrome)   . Kidney disorder    Pt states only has left kidney  . Panic attacks   . Pulmonary embolism ([place]) 2018   pt states resolved and no longer takes blood thinners    Past Surgical History:  Procedure Laterality Date  . TUBAL LIGATION      There were no vitals filed for this visit.   Subjective Assessment - 07/27/17 1246    Subjective  Patient reports she is here today to be seen by her medical team for her newly diagnosed right breast cancer.    Pertinent History  Patient was diagnosed on 07/12/17 with right Invasive ductal carcinoma breast cancer. It measures 1.6 cm and is located in her upper outer quadrant. 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 She was hospitalized in 01/2017 with diverticulitis and also had a pulmonary embolus. She recently lost her husband in 10/2016 due to pancreatic cancer.    Patient Stated Goals  Reduce lymphedema risk and learn post op shoulder ROM HEP    Currently in Pain?  Yes    Pain Score  -- Varies    Pain Location  -- All joints due to OA    Pain  Orientation  Right;Left    Pain Descriptors / Indicators  Aching    Pain Type  Chronic pain    Pain Onset  More than a month ago    Pain Frequency  Intermittent    Aggravating Factors   Unknown    Pain Relieving Factors  Unknown    Multiple Pain Sites  No         [facility] PT Assessment - 07/27/17 0001      Assessment   Medical Diagnosis  Right breast cancer    Referring Provider  Dr. [name] Cornett    Onset Date/Surgical Date  07/12/17    Hand Dominance  Right    Prior Therapy  none      Precautions   Precautions  Other (comment)    Precaution Comments  active cancer      Restrictions   Weight Bearing Restrictions  No      Balance Screen   Has the patient fallen in the past 6 months  No    Has the patient had a decrease in activity level because of a fear of falling?   No    Is the patient reluctant to leave their home because of a  fear of falling?   No      Home Environment   Living Environment  Private residence    Living Arrangements  Alone    Available Help at Discharge  Family      Prior Function   Level of [place]  Retired    Leisure  She does not exercise      Cognition   Overall Cognitive Status  Within Functional Limits for tasks assessed      Posture/Postural Control   Posture/Postural Control  Postural limitations    Postural Limitations  Rounded Shoulders;Forward head      ROM / Strength   AROM / PROM / Strength  AROM;Strength      AROM   AROM Assessment Site  Shoulder;Cervical    Right/Left Shoulder  Right;Left    Right Shoulder Extension  50 Degrees    Right Shoulder Flexion  158 Degrees    Right Shoulder ABduction  167 Degrees    Right Shoulder Internal Rotation  68 Degrees    Right Shoulder External Rotation  80 Degrees    Left Shoulder Extension  47 Degrees    Left Shoulder Flexion  130 Degrees    Left Shoulder ABduction  162 Degrees    Left Shoulder Internal Rotation  66 Degrees    Left Shoulder External  Rotation  90 Degrees    Cervical Flexion  WNL    Cervical Extension  WNL    Cervical - Right Side Bend  WNL    Cervical - Left Side Bend  WNL    Cervical - Right Rotation  WNL    Cervical - Left Rotation  WNL      Strength   Overall Strength  Within functional limits for tasks performed        LYMPHEDEMA/ONCOLOGY QUESTIONNAIRE - 07/27/17 1432      Type   Cancer Type  Right breast cancer      Lymphedema Assessments   Lymphedema Assessments  Upper extremities      Right Upper Extremity Lymphedema   10 cm Proximal to Olecranon Process  27.1 cm    Olecranon Process  24.1 cm    10 cm Proximal to Ulnar Styloid Process  20.9 cm    Just Proximal to Ulnar Styloid Process  15 cm    Across Hand at [employer]  18 cm    At [place] of 2nd Digit  6 cm      Left Upper Extremity Lymphedema   10 cm Proximal to Olecranon Process  25.6 cm    Olecranon Process  23.4 cm    10 cm Proximal to Ulnar Styloid Process  20.1 cm    Just Proximal to Ulnar Styloid Process  14 cm             Objective measurements completed on examination: See above findings.    Patient was instructed today in a home exercise program today for post op shoulder range of motion. These included active assist shoulder flexion in sitting, scapular retraction, wall walking with shoulder abduction, and hands behind head external rotation.  She was encouraged to do these twice a day, holding 3 seconds and repeating 5 times when permitted by her physician.     PT Education - 07/27/17 1434    Education Details  Lymphedema risk reduction and post op shoulder ROM HEP    Person(s) Educated  Patient;Child(ren)    Methods  Explanation;Demonstration;Handout    Comprehension  Returned demonstration;Verbalized understanding          PT Long Term Goals - 07/27/17 1444      PT LONG TERM GOAL #1   Title  Patient will demonstrate she has returned to baseline related to shoulder ROM and function.    Time  8    Period   Weeks    Status  New      Breast Clinic Goals - 07/27/17 1444      Patient will be able to verbalize understanding of pertinent lymphedema risk reduction practices relevant to her diagnosis specifically related to skin care.   Time  1    Period  Days    Status  Achieved      Patient will be able to return demonstrate and/or verbalize understanding of the post-op home exercise program related to regaining shoulder range of motion.   Time  1    Period  Days    Status  Achieved      Patient will be able to verbalize understanding of the importance of attending the postoperative After Breast Cancer Class for further lymphedema risk reduction education and therapeutic exercise.   Time  1    Period  Days    Status  Achieved            Plan - 07/27/17 1438    Clinical Impression Statement  Patient was diagnosed on 07/12/17 with right Invasive ductal carcinoma breast cancer. It measures 1.6 cm and is located in her upper outer quadrant. 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 She was hospitalized in 01/2017 with diverticulitis and also had a pulmonary embolus. She recently lost her husband in 10/2016 due to pancreatic cancer. Her multidisciplinary medical team met prior to her assessments to determine a recommended treatment plan. She is planning to have a right lumpectomy and sentinel node biopsy followed by Oncotype testing, radiation, and anti-estrogen therapy. She will benefit from a post op PT visit to reassess and determine needs.    History and Personal Factors relevant to plan of care:  Hospitalized 1/19 with PE, recent loss of her husband, lives alone.    Clinical Presentation  Stable    Clinical Decision Making  Low    Rehab Potential  Excellent    Clinical Impairments Affecting Rehab Potential  None    PT Frequency  -- Eval and 1 f/u visit    PT Treatment/Interventions  ADLs/Self Care Home Management;Therapeutic exercise;Patient/family education    PT Next  Visit Plan  Will reassess 3-4 weeks post op to determine needs    PT Home Exercise Plan  Post op shoulder ROM HEP    Consulted and Agree with Plan of Care  Patient;Family member/caregiver    Family Member Consulted  Daughter       Patient will benefit from skilled therapeutic intervention in order to improve the following deficits and impairments:  Decreased knowledge of precautions, Decreased range of motion, Postural dysfunction, Pain  Visit Diagnosis: Malignant neoplasm of upper-outer quadrant of right breast in female, estrogen receptor positive ([place]) - Plan: PT plan of care cert/re-cert  Abnormal posture - Plan: PT plan of care cert/re-cert   Patient will follow up at outpatient cancer rehab 3-4 weeks following surgery.  If the patient requires physical therapy at that time, a specific plan will be dictated and sent to the referring physician for approval. The patient was educated today on appropriate basic range of motion exercises to begin post operatively  and the importance of attending the After Breast Cancer class following surgery.  Patient was educated today on lymphedema risk reduction practices as it pertains to recommendations that will benefit the patient immediately following surgery.  She verbalized good understanding.     Problem List Patient Active Problem List   Diagnosis Date Noted  . Malignant neoplasm of upper-outer quadrant of right breast in female, estrogen receptor positive ([place]) 07/22/2017  . Pure hypercholesterolemia 07/14/2017  . Palpitations 04/18/2017  . Shortness of breath 04/18/2017  . Atypical chest pain 03/26/2017  . Leukocytosis 03/26/2017  . Infected tooth 03/26/2017  . Panic attacks   . GERD (gastroesophageal reflux disease)   . Diverticulosis   . Anxiety   . Kidney disorder     Annia Friendly, [place] 07/27/17 2:47 PM  [place], [place], [address] Phone:  [phone]   Fax:  [phone]  Name: Monaye Blackie MRN: [id number] Date of Birth: [birth year]

## 2017-07-27 NOTE — H&P (Signed)
Melissa [name] Documented: 07/27/2017 7:35 AM Location: [facility] Patient #: [id number] DOB: 1951-01-18 Undefined / Language: Melissa [name] / Race: White Female  History of Present Illness Melissa [name] A. Melissa Hollar MD; 07/27/2017 11:02 AM) Patient words: Pt sent at the request of Dr Marcelo Baldy for a mammogrphic abnomality of the right breast upper outer quadrant measuring 1.6 cm. Core bx shows IDC ER PR + HER 2 NEU NEGATIVE.Pt denies history of breast mass pain discharge or change in appearance of her breast.  The patient is a 67 year old female.   Past Surgical History [name], RN; 07/27/2017 7:36 AM) Breast Biopsy Right. multiple Oral Surgery  Diagnostic Studies History [name], RN; 07/27/2017 7:36 AM) Colonoscopy within last year Mammogram within last year  Medication History [name], RN; 07/27/2017 7:36 AM) Medications Reconciled  Social History [name], RN; 07/27/2017 7:36 AM) Alcohol use Occasional alcohol use. No drug use Tobacco use Former smoker.  Family History [name], RN; 07/27/2017 7:36 AM) Alcohol Abuse Family Members In General, Father. Anesthetic complications Sister. Arthritis Family Members In General, Father, Sister. Cerebrovascular Accident Father. Cervical Cancer Family Members In General. Colon Cancer Family Members In General. Colon Polyps Family Members In General. Depression Family Members In General, Mother. Diabetes Mellitus Sister. Heart Disease Family Members In General. Ischemic Bowel Disease Father. Respiratory Condition Father. Thyroid problems Sister.  Pregnancy / Birth History [name], RN; 07/27/2017 7:36 AM) Age of menopause 51-55 Contraceptive History Oral contraceptives. Gravida 2 Irregular periods Maternal age 37-20  Other Problems [name], RN; 07/27/2017 7:36 AM) Anxiety Disorder Back Pain Breast Cancer Diverticulosis Gastroesophageal Reflux  Disease Hemorrhoids Hypercholesterolemia Lump In Breast Pulmonary Embolism / Blood Clot in Legs     Review of Systems [name] RN; 07/27/2017 7:36 AM) General Present- Weight Gain. Not Present- Appetite Loss, Chills, Fatigue, Fever, Night Sweats and Weight Loss. Skin Present- Dryness and Rash. Not Present- Change in Wart/Mole, Hives, Jaundice, New Lesions, Non-Healing Wounds and Ulcer. HEENT Present- Sinus Pain and Wears glasses/contact lenses. Not Present- Earache, Hearing Loss, Hoarseness, Nose Bleed, Oral Ulcers, Ringing in the Ears, Seasonal Allergies, Sore Throat, Visual Disturbances and Yellow Eyes. Respiratory Present- Snoring. Not Present- Bloody sputum, Chronic Cough, Difficulty Breathing and Wheezing. Breast Not Present- Breast Mass, Breast Pain, Nipple Discharge and Skin Changes. Cardiovascular Not Present- Chest Pain, Difficulty Breathing Lying Down, Leg Cramps, Palpitations, Rapid Heart Rate, Shortness of Breath and Swelling of Extremities. Gastrointestinal Present- Gets full quickly at meals, Hemorrhoids and Indigestion. Not Present- Abdominal Pain, Bloating, Bloody Stool, Change in Bowel Habits, Chronic diarrhea, Constipation, Difficulty Swallowing, Excessive gas, Nausea, Rectal Pain and Vomiting. Female Genitourinary Not Present- Frequency, Nocturia, Painful Urination, Pelvic Pain and Urgency. Musculoskeletal Present- Joint Pain and Muscle Pain. Not Present- Back Pain, Joint Stiffness, Muscle Weakness and Swelling of Extremities. Neurological Not Present- Decreased Memory, Fainting, Headaches, Numbness, Seizures, Tingling, Tremor, Trouble walking and Weakness. Psychiatric Present- Anxiety and Change in Sleep Pattern. Not Present- Bipolar, Depression, Fearful and Frequent crying.   Physical Exam (Edom Schmuhl A. Sammantha Mehlhaff MD; 07/27/2017 11:02 AM)  General Mental Status-Alert. General Appearance-Consistent with stated age. Hydration-Well  hydrated. Voice-Normal.  Eye Eyeball - Bilateral-Extraocular movements intact. Sclera/Conjunctiva - Bilateral-No scleral icterus.  Chest and Lung Exam Chest and lung exam reveals -quiet, even and easy respiratory effort with no use of accessory muscles and on auscultation, normal breath sounds, no adventitious sounds and normal vocal resonance. Inspection Chest Wall - Normal. Back - normal.  Breast Note: bruising right breast UOQ no mass left breast  normal  Cardiovascular Cardiovascular examination reveals -normal heart sounds, regular rate and rhythm with no murmurs and normal pedal pulses bilaterally.  Neurologic Neurologic evaluation reveals -alert and oriented x 3 with no impairment of recent or remote memory. Mental Status-Normal.  Lymphatic Head & Neck  General Head & Neck Lymphatics: Bilateral - Description - Normal. Axillary  General Axillary Region: Bilateral - Description - Normal. Tenderness - Non Tender.    Assessment & Plan (Costas Sena A. Hoda Hon MD; 07/27/2017 11:04 AM)  BREAST CANCER, RIGHT (C50.911) Impression: discussed lumpectomy vs mastectomy pt good lumpectomy candidate plan on right lumpectomy and Risk of lumpectomy include bleeding, infection, seroma, more surgery, use of seed/wire, wound care, cosmetic deformity and the need for other treatments, death , blood clots, death. Pt agrees to proceed. Risk of sentinel lymph node mapping include bleeding, infection, lymphedema, shoulder pain. stiffness, dye allergy. cosmetic deformity , blood clots, death, need for more surgery. Pt agres to proceed. SLN mapping  Current Plans You are being scheduled for surgery- Our schedulers will call you.  You should hear from our office's scheduling department within 5 working days about the location, date, and time of surgery. We try to make accommodations for patient's preferences in scheduling surgery, but sometimes the OR schedule or the surgeon's schedule  prevents [place] from making those accommodations.  If you have not heard from our office [phone]) in 5 working days, call the office and ask for your surgeon's nurse.  If you have other questions about your diagnosis, plan, or surgery, call the office and ask for your surgeon's nurse.  We discussed the staging and pathophysiology of breast cancer. We discussed all of the different options for treatment for breast cancer including surgery, chemotherapy, radiation therapy, Herceptin, and antiestrogen therapy. We discussed a sentinel lymph node biopsy as she does not appear to having lymph node involvement right now. We discussed the performance of that with injection of radioactive tracer and blue dye. We discussed that she would have an incision underneath her axillary hairline. We discussed that there is a bout a 10-20% chance of having a positive node with a sentinel lymph node biopsy and we will await the permanent pathology to make any other first further decisions in terms of her treatment. One of these options might be to return to the operating room to perform an axillary lymph node dissection. We discussed about a 1-2% risk lifetime of chronic shoulder pain as well as lymphedema associated with a sentinel lymph node biopsy. We discussed the options for treatment of the breast cancer which included lumpectomy versus a mastectomy. We discussed the performance of the lumpectomy with a wire placement. We discussed a 10-20% chance of a positive margin requiring reexcision in the operating room. We also discussed that she may need radiation therapy or antiestrogen therapy or both if she undergoes lumpectomy. We discussed the mastectomy and the postoperative care for that as well. We discussed that there is no difference in her survival whether she undergoes lumpectomy with radiation therapy or antiestrogen therapy versus a mastectomy. There is a slight difference in the local recurrence rate being 3-5%  with lumpectomy and about 1% with a mastectomy. We discussed the risks of operation including bleeding, infection, possible reoperation. She understands her further therapy will be based on what her stages at the time of her operation.  Pt Education - Pamphlet Given - Breast Biopsy: discussed with patient and provided information. Pt Education - [facility] Breast Cancer Information Given - [facility] "Breast Journey"  Package Pt Education - flb breast cancer surgery: discussed with patient and provided information. Pt Education - [facility] Breast Biopsy HCI: discussed with patient and provided information. Pt Education - ABC (After Breast Cancer) Class Info: discussed with patient and provided information.

## 2017-08-04 ENCOUNTER — Telehealth: Payer: Self-pay | Admitting: *Deleted

## 2017-08-04 NOTE — Telephone Encounter (Signed)
Spoke to pt concerning [place] from 7.3.19. Denies questions or concerns regarding dx or treatment care plan. Discussed next steps after sx. Encourage pt to call with needs. Received verbal understanding.

## 2017-08-16 ENCOUNTER — Encounter (HOSPITAL_BASED_OUTPATIENT_CLINIC_OR_DEPARTMENT_OTHER): Payer: Self-pay | Admitting: *Deleted

## 2017-08-16 ENCOUNTER — Other Ambulatory Visit: Payer: Self-pay

## 2017-08-22 NOTE — Progress Notes (Signed)
Ensure pre surgery drink given with instructions to complete by 0400 dos,surgical soap given with instructions,  pt verbalized understanding. 

## 2017-08-23 ENCOUNTER — Ambulatory Visit (HOSPITAL_BASED_OUTPATIENT_CLINIC_OR_DEPARTMENT_OTHER)
Admission: RE | Admit: 2017-08-23 | Discharge: 2017-08-23 | Disposition: A | Discharge status: Home / Self Care | Payer: Medicare Other | Source: Ambulatory Visit | Attending: Surgery | Admitting: Surgery

## 2017-08-23 ENCOUNTER — Other Ambulatory Visit: Payer: Self-pay

## 2017-08-23 ENCOUNTER — Encounter (HOSPITAL_BASED_OUTPATIENT_CLINIC_OR_DEPARTMENT_OTHER): Payer: Self-pay

## 2017-08-23 ENCOUNTER — Ambulatory Visit (HOSPITAL_BASED_OUTPATIENT_CLINIC_OR_DEPARTMENT_OTHER): Payer: Medicare Other | Admitting: Anesthesiology

## 2017-08-23 ENCOUNTER — Encounter (HOSPITAL_BASED_OUTPATIENT_CLINIC_OR_DEPARTMENT_OTHER)
Admission: RE | Disposition: A | Discharge status: Home / Self Care | Payer: Self-pay | Source: Ambulatory Visit | Attending: Surgery

## 2017-08-23 ENCOUNTER — Ambulatory Visit (HOSPITAL_COMMUNITY)
Admission: RE | Admit: 2017-08-23 | Discharge: 2017-08-23 | Disposition: A | Discharge status: Home / Self Care | Payer: Medicare Other | Source: Ambulatory Visit | Attending: Surgery | Admitting: Surgery

## 2017-08-23 DIAGNOSIS — C50411 Malignant neoplasm of upper-outer quadrant of right female breast: Secondary | ICD-10-CM | POA: Diagnosis not present

## 2017-08-23 DIAGNOSIS — F419 Anxiety disorder, unspecified: Secondary | ICD-10-CM | POA: Diagnosis not present

## 2017-08-23 DIAGNOSIS — C50911 Malignant neoplasm of unspecified site of right female breast: Secondary | ICD-10-CM

## 2017-08-23 DIAGNOSIS — Z170 Estrogen receptor positive status [ER+]: Secondary | ICD-10-CM | POA: Diagnosis not present

## 2017-08-23 DIAGNOSIS — Z86711 Personal history of pulmonary embolism: Secondary | ICD-10-CM | POA: Insufficient documentation

## 2017-08-23 DIAGNOSIS — K219 Gastro-esophageal reflux disease without esophagitis: Secondary | ICD-10-CM | POA: Diagnosis not present

## 2017-08-23 DIAGNOSIS — Z87891 Personal history of nicotine dependence: Secondary | ICD-10-CM | POA: Diagnosis not present

## 2017-08-23 DIAGNOSIS — E7800 Pure hypercholesterolemia, unspecified: Secondary | ICD-10-CM | POA: Insufficient documentation

## 2017-08-23 DIAGNOSIS — Z79899 Other long term (current) drug therapy: Secondary | ICD-10-CM | POA: Diagnosis not present

## 2017-08-23 HISTORY — DX: Malignant (primary) neoplasm, unspecified: C80.1

## 2017-08-23 HISTORY — PX: BREAST LUMPECTOMY WITH RADIOACTIVE SEED AND SENTINEL LYMPH NODE BIOPSY: SHX6550

## 2017-08-23 HISTORY — DX: Unspecified osteoarthritis, unspecified site: M19.90

## 2017-08-23 SURGERY — BREAST LUMPECTOMY WITH RADIOACTIVE SEED AND SENTINEL LYMPH NODE BIOPSY
Anesthesia: Regional | Site: Breast | Laterality: Right

## 2017-08-23 MED ORDER — LIDOCAINE HCL (CARDIAC) PF 100 MG/5ML IV SOSY
PREFILLED_SYRINGE | INTRAVENOUS | Status: DC | PRN
  Administered 2017-08-23: 100 mg via INTRAVENOUS

## 2017-08-23 MED ORDER — ACETAMINOPHEN 500 MG PO TABS
ORAL_TABLET | ORAL | Status: AC
  Filled 2017-08-23: qty 2

## 2017-08-23 MED ORDER — ONDANSETRON HCL 4 MG/2ML IJ SOLN: 4.0000 mg | Freq: Four times a day (QID) | INTRAMUSCULAR | Status: DC | PRN

## 2017-08-23 MED ORDER — SCOPOLAMINE 1 MG/3DAYS TD PT72: 1.0000 | MEDICATED_PATCH | Freq: Once | TRANSDERMAL | Status: DC | PRN

## 2017-08-23 MED ORDER — FENTANYL CITRATE (PF) 100 MCG/2ML IJ SOLN: 25.0000 ug | INTRAMUSCULAR | Status: DC | PRN

## 2017-08-23 MED ORDER — FENTANYL CITRATE (PF) 100 MCG/2ML IJ SOLN
INTRAMUSCULAR | Status: AC
  Filled 2017-08-23: qty 2

## 2017-08-23 MED ORDER — PROPOFOL 10 MG/ML IV BOLUS
INTRAVENOUS | Status: AC
  Filled 2017-08-23: qty 20

## 2017-08-23 MED ORDER — GABAPENTIN 300 MG PO CAPS
ORAL_CAPSULE | ORAL | Status: AC
  Filled 2017-08-23: qty 1

## 2017-08-23 MED ORDER — ACETAMINOPHEN 500 MG PO TABS
1000.0000 mg | ORAL_TABLET | ORAL | Status: AC
  Administered 2017-08-23: 1000 mg via ORAL

## 2017-08-23 MED ORDER — CELECOXIB 200 MG PO CAPS
ORAL_CAPSULE | ORAL | Status: AC
  Filled 2017-08-23: qty 1

## 2017-08-23 MED ORDER — MIDAZOLAM HCL 2 MG/2ML IJ SOLN
1.0000 mg | INTRAMUSCULAR | Status: DC | PRN
  Administered 2017-08-23 (×2): 1 mg via INTRAVENOUS

## 2017-08-23 MED ORDER — PROPOFOL 10 MG/ML IV BOLUS
INTRAVENOUS | Status: AC
Start: 2017-08-23 — End: ?
  Filled 2017-08-23: qty 20

## 2017-08-23 MED ORDER — BUPIVACAINE-EPINEPHRINE (PF) 0.25% -1:200000 IJ SOLN
INTRAMUSCULAR | Status: DC | PRN
  Administered 2017-08-23: 20 mL

## 2017-08-23 MED ORDER — MIDAZOLAM HCL 2 MG/2ML IJ SOLN
INTRAMUSCULAR | Status: AC
Start: 2017-08-23 — End: ?
  Filled 2017-08-23: qty 2

## 2017-08-23 MED ORDER — HEPARIN SODIUM (PORCINE) 5000 UNIT/ML IJ SOLN
INTRAMUSCULAR | Status: AC
  Filled 2017-08-23: qty 1

## 2017-08-23 MED ORDER — MIDAZOLAM HCL 5 MG/5ML IJ SOLN
INTRAMUSCULAR | Status: DC | PRN
  Administered 2017-08-23: 2 mg via INTRAVENOUS

## 2017-08-23 MED ORDER — CELECOXIB 400 MG PO CAPS
400.0000 mg | ORAL_CAPSULE | ORAL | Status: AC
  Administered 2017-08-23: 400 mg via ORAL

## 2017-08-23 MED ORDER — LIDOCAINE HCL (CARDIAC) PF 100 MG/5ML IV SOSY
PREFILLED_SYRINGE | INTRAVENOUS | Status: AC
  Filled 2017-08-23: qty 5

## 2017-08-23 MED ORDER — CHLORHEXIDINE GLUCONATE CLOTH 2 % EX PADS: 6.0000 | MEDICATED_PAD | Freq: Once | CUTANEOUS | Status: DC

## 2017-08-23 MED ORDER — DEXAMETHASONE SODIUM PHOSPHATE 10 MG/ML IJ SOLN
INTRAMUSCULAR | Status: AC
  Filled 2017-08-23: qty 1

## 2017-08-23 MED ORDER — FENTANYL CITRATE (PF) 100 MCG/2ML IJ SOLN
INTRAMUSCULAR | Status: AC
Start: 2017-08-23 — End: ?
  Filled 2017-08-23: qty 2

## 2017-08-23 MED ORDER — GABAPENTIN 300 MG PO CAPS
300.0000 mg | ORAL_CAPSULE | ORAL | Status: AC
  Administered 2017-08-23: 300 mg via ORAL

## 2017-08-23 MED ORDER — OXYCODONE HCL 5 MG/5ML PO SOLN: 5.0000 mg | Freq: Once | ORAL | Status: DC | PRN

## 2017-08-23 MED ORDER — OXYCODONE HCL 5 MG PO TABS: 5.0000 mg | ORAL_TABLET | Freq: Once | ORAL | Status: DC | PRN

## 2017-08-23 MED ORDER — DEXTROSE 5 % IV SOLN
3.0000 g | INTRAVENOUS | Status: AC
  Administered 2017-08-23: 2 g via INTRAVENOUS

## 2017-08-23 MED ORDER — DEXAMETHASONE SODIUM PHOSPHATE 4 MG/ML IJ SOLN
INTRAMUSCULAR | Status: DC | PRN
  Administered 2017-08-23: 10 mg via INTRAVENOUS

## 2017-08-23 MED ORDER — LACTATED RINGERS IV SOLN
INTRAVENOUS | Status: DC
  Administered 2017-08-23 (×2): via INTRAVENOUS

## 2017-08-23 MED ORDER — ROPIVACAINE HCL 7.5 MG/ML IJ SOLN
INTRAMUSCULAR | Status: DC | PRN
  Administered 2017-08-23: 30 mL via PERINEURAL

## 2017-08-23 MED ORDER — FENTANYL CITRATE (PF) 100 MCG/2ML IJ SOLN
50.0000 ug | INTRAMUSCULAR | Status: DC | PRN
  Administered 2017-08-23: 50 ug via INTRAVENOUS

## 2017-08-23 MED ORDER — CEFAZOLIN SODIUM-DEXTROSE 2-4 GM/100ML-% IV SOLN
INTRAVENOUS | Status: AC
  Filled 2017-08-23: qty 100

## 2017-08-23 MED ORDER — HEPARIN SODIUM (PORCINE) 5000 UNIT/ML IJ SOLN
5000.0000 [IU] | Freq: Once | INTRAMUSCULAR | Status: AC
  Administered 2017-08-23: 5000 [IU] via SUBCUTANEOUS

## 2017-08-23 MED ORDER — SCOPOLAMINE 1 MG/3DAYS TD PT72
MEDICATED_PATCH | TRANSDERMAL | Status: AC
  Filled 2017-08-23: qty 1

## 2017-08-23 MED ORDER — MIDAZOLAM HCL 2 MG/2ML IJ SOLN
INTRAMUSCULAR | Status: AC
  Filled 2017-08-23: qty 2

## 2017-08-23 MED ORDER — FENTANYL CITRATE (PF) 100 MCG/2ML IJ SOLN
INTRAMUSCULAR | Status: DC | PRN
  Administered 2017-08-23: 100 ug via INTRAVENOUS
  Administered 2017-08-23: 25 ug via INTRAVENOUS

## 2017-08-23 MED ORDER — ONDANSETRON HCL 4 MG/2ML IJ SOLN
INTRAMUSCULAR | Status: DC | PRN
  Administered 2017-08-23: 4 mg via INTRAVENOUS

## 2017-08-23 MED ORDER — SCOPOLAMINE 1 MG/3DAYS TD PT72
MEDICATED_PATCH | TRANSDERMAL | Status: DC | PRN
  Administered 2017-08-23: 1 via TRANSDERMAL

## 2017-08-23 MED ORDER — OXYCODONE HCL 5 MG PO TABS: 5.0000 mg | ORAL_TABLET | Freq: Four times a day (QID) | ORAL | 0 refills | Status: DC | PRN

## 2017-08-23 MED ORDER — EPHEDRINE SULFATE 50 MG/ML IJ SOLN
INTRAMUSCULAR | Status: DC | PRN
  Administered 2017-08-23: 25 mg via INTRAVENOUS
  Administered 2017-08-23: 10 mg via INTRAVENOUS

## 2017-08-23 MED ORDER — IBUPROFEN 800 MG PO TABS: 800.0000 mg | ORAL_TABLET | Freq: Three times a day (TID) | ORAL | 0 refills | Status: DC | PRN

## 2017-08-23 MED ORDER — PROPOFOL 10 MG/ML IV BOLUS
INTRAVENOUS | Status: DC | PRN
  Administered 2017-08-23: 150 mg via INTRAVENOUS

## 2017-08-23 MED ORDER — ONDANSETRON HCL 4 MG/2ML IJ SOLN
INTRAMUSCULAR | Status: AC
  Filled 2017-08-23: qty 2

## 2017-08-23 MED ORDER — PHENYLEPHRINE HCL 10 MG/ML IJ SOLN
INTRAMUSCULAR | Status: DC | PRN
  Administered 2017-08-23: 80 ug via INTRAVENOUS

## 2017-08-23 MED ORDER — TECHNETIUM TC 99M SULFUR COLLOID FILTERED
1.0000 | Freq: Once | INTRAVENOUS | Status: AC | PRN
  Administered 2017-08-23: 1 via INTRADERMAL

## 2017-08-23 SURGICAL SUPPLY — 52 items
ADH SKN CLS APL DERMABOND .7 (GAUZE/BANDAGES/DRESSINGS) ×1
APPLIER CLIP 9.375 MED OPEN (MISCELLANEOUS) ×2
APR CLP MED 9.3 20 MLT OPN (MISCELLANEOUS) ×1
BINDER BREAST LRG (GAUZE/BANDAGES/DRESSINGS) IMPLANT
BINDER BREAST XLRG (GAUZE/BANDAGES/DRESSINGS) ×1 IMPLANT
BLADE SURG 15 STRL LF DISP TIS (BLADE) ×1 IMPLANT
BLADE SURG 15 STRL SS (BLADE) ×2
CANISTER SUC SOCK COL 7IN (MISCELLANEOUS) IMPLANT
CANISTER SUCT 1200ML W/VALVE (MISCELLANEOUS) ×2 IMPLANT
CHLORAPREP W/TINT 26ML (MISCELLANEOUS) ×2 IMPLANT
CLIP APPLIE 9.375 MED OPEN (MISCELLANEOUS) ×1 IMPLANT
COVER BACK TABLE 60X90IN (DRAPES) ×2 IMPLANT
COVER MAYO STAND STRL (DRAPES) ×2 IMPLANT
COVER PROBE W GEL 5X96 (DRAPES) ×2 IMPLANT
DECANTER SPIKE VIAL GLASS SM (MISCELLANEOUS) IMPLANT
DERMABOND ADVANCED (GAUZE/BANDAGES/DRESSINGS) ×1
DERMABOND ADVANCED .7 DNX12 (GAUZE/BANDAGES/DRESSINGS) ×1 IMPLANT
DEVICE DUBIN W/COMP PLATE 8390 (MISCELLANEOUS) ×2 IMPLANT
DRAPE LAPAROSCOPIC ABDOMINAL (DRAPES) ×2 IMPLANT
DRAPE UTILITY XL STRL (DRAPES) ×2 IMPLANT
ELECT COATED BLADE 2.86 ST (ELECTRODE) ×2 IMPLANT
ELECT REM PT RETURN 9FT ADLT (ELECTROSURGICAL) ×2
ELECTRODE REM PT RTRN 9FT ADLT (ELECTROSURGICAL) ×1 IMPLANT
GLOVE BIOGEL PI IND STRL 7.0 (GLOVE) IMPLANT
GLOVE BIOGEL PI IND STRL 8 (GLOVE) ×1 IMPLANT
GLOVE BIOGEL PI INDICATOR 7.0 (GLOVE) ×1
GLOVE BIOGEL PI INDICATOR 8 (GLOVE) ×1
GLOVE ECLIPSE 8.0 STRL XLNG CF (GLOVE) ×2 IMPLANT
GLOVE SURG SYN 7.5  E (GLOVE) ×1
GLOVE SURG SYN 7.5 E (GLOVE) ×1 IMPLANT
GLOVE SURG SYN 7.5 PF PI (GLOVE) IMPLANT
GOWN STRL REUS W/ TWL LRG LVL3 (GOWN DISPOSABLE) ×2 IMPLANT
GOWN STRL REUS W/TWL LRG LVL3 (GOWN DISPOSABLE) ×4
HEMOSTAT ARISTA ABSORB 3G PWDR (MISCELLANEOUS) IMPLANT
HEMOSTAT SNOW SURGICEL 2X4 (HEMOSTASIS) ×1 IMPLANT
KIT MARKER MARGIN INK (KITS) ×2 IMPLANT
NDL HYPO 25X1 1.5 SAFETY (NEEDLE) ×1 IMPLANT
NDL SAFETY ECLIPSE 18X1.5 (NEEDLE) IMPLANT
NEEDLE HYPO 18GX1.5 SHARP (NEEDLE)
NEEDLE HYPO 25X1 1.5 SAFETY (NEEDLE) ×2 IMPLANT
NS IRRIG 1000ML POUR BTL (IV SOLUTION) ×2 IMPLANT
PACK BASIN DAY SURGERY FS (CUSTOM PROCEDURE TRAY) ×2 IMPLANT
PENCIL BUTTON HOLSTER BLD 10FT (ELECTRODE) ×2 IMPLANT
SLEEVE SCD COMPRESS KNEE MED (MISCELLANEOUS) ×2 IMPLANT
SPONGE LAP 4X18 RFD (DISPOSABLE) ×2 IMPLANT
SUT MNCRL AB 4-0 PS2 18 (SUTURE) ×2 IMPLANT
SUT VICRYL 3-0 CR8 SH (SUTURE) ×2 IMPLANT
SYR CONTROL 10ML LL (SYRINGE) ×2 IMPLANT
TOWEL GREEN STERILE FF (TOWEL DISPOSABLE) ×2 IMPLANT
TOWEL OR NON WOVEN STRL DISP B (DISPOSABLE) ×2 IMPLANT
TUBE CONNECTING 20X1/4 (TUBING) ×2 IMPLANT
YANKAUER SUCT BULB TIP NO VENT (SUCTIONS) ×2 IMPLANT

## 2017-08-23 NOTE — Progress Notes (Signed)
  Assisted Dr. Hodierne with right, ultrasound guided, pectoralis block. Side rails up, monitors on throughout procedure. See vital signs in flow sheet. Tolerated Procedure well. 

## 2017-08-23 NOTE — Anesthesia Postprocedure Evaluation (Signed)
Anesthesia Post Note  Patient: Melissa [name]  Procedure(s) Performed: BREAST LUMPECTOMY WITH RADIOACTIVE SEED AND SENTINEL LYMPH NODE BIOPSY (Right Breast)     Patient location during evaluation: PACU Anesthesia Type: Regional and General Level of consciousness: awake and alert Pain management: pain level controlled Vital Signs Assessment: post-procedure vital signs reviewed and stable Respiratory status: spontaneous breathing, nonlabored ventilation, respiratory function stable and patient connected to nasal cannula oxygen Cardiovascular status: blood pressure returned to baseline and stable Postop Assessment: no apparent nausea or vomiting Anesthetic complications: no    Last Vitals:  Vitals:   08/23/17 0915 08/23/17 0944  BP: 133/73 (!) 144/76  Pulse: (!) 101 97  Resp: 13 18  Temp:  36.5 C  SpO2: 94% 100%    Last Pain:  Vitals:   08/23/17 0944  TempSrc:   PainSc: 0-No pain                 Azahel Belcastro S

## 2017-08-23 NOTE — Op Note (Signed)
Preoperative diagnosis: Stage I right breast cancer upper outer quadrant  Postoperative diagnosis: Same  Procedure: Right breast seed localized lumpectomy with right axillary deep sentinel lymph node mapping  Surgeon: Erroll Luna, MD  Anesthesia: LMA with pectoral block and local anesthetic  EBL: 10 cc  Specimen: Right breast tissue with seed and clip verified by Faxitron plus additional anterior margin and 3 deep right axillary sentinel nodes hot  Drains: None  IV fluids: Per anesthesia record  Indications for procedure: Patient presents for right breast lumpectomy for stage I right breast cancer.  She was seen in the multidisciplinary clinic and opted for breast conservation after reviewing all of her options.  Risk, benefits and other options discussed with the patient.The procedure has been discussed with the patient. Alternatives to surgery have been discussed with the patient.  Risks of surgery include bleeding,  Infection,  Seroma formation, death,  and the need for further surgery.   The patient understands and wishes to proceed.Sentinel lymph node mapping and dissection has been discussed with the patient.  Risk of bleeding,  Infection,  Seroma formation,  Additional procedures,,  Shoulder weakness ,  Shoulder stiffness,  Nerve and blood vessel injury and reaction to the mapping dyes have been discussed.  Alternatives to surgery have been discussed with the patient.  The patient agrees to proceed.    Description of procedure: The patient was met in the holding area and questions were answered.  She underwent pectoral block of the right breast.  Neoprobe used to verify seed location in the right breast.  All questions were answered.  She underwent injection by nuclear medicine.  She was taken back to the operating room.  She was placed supine upon the operating room table.  After induction of LMA anesthesia, right breast was prepped and draped in sterile fashion timeout was done.   Neoprobe was used to localize the seed in the right breast upper outer quadrant near the axilla.  Local anesthetic infiltrated in the right upper breast area.  Incision made in the lateral upper outer quadrant of the right breast.  Dissection carried down and all tissue around the seed and clip were excised with a grossly negative margin.  Faxitron image revealed seed clip to be in the specimen.  The anterior margin I felt was close therefore I excised additional anterior margin sent separately.  The cavity was hemostatic.  The neoprobe settings were changed to technetium sulfur colloid.  Through the same incision is able to evaluate the right axillary lymph node basin.  I was able to access the right axillary lymph node basin through this incision.  Identified 3 right deep axillary sentinel nodes that were removed.  These were grossly normal looking.  Background counts approaches 0.  Irrigation used and suctioned out.  Hemostasis achieved.  Wound closed with 3-0 Vicryl and 4-0 Monocryl.  Dermabond applied.  All final counts found to be correct of sponge, needles and instruments.  Breast binder placed.  The patient was awoke extubated taken to recovery in satisfactory condition.

## 2017-08-23 NOTE — Interval H&P Note (Signed)
History and Physical Interval Note:  08/23/2017 7:29 AM  Melissa [name]  has presented today for surgery, with the diagnosis of RIGHT BREAST CANCER  The various methods of treatment have been discussed with the patient and family. After consideration of risks, benefits and other options for treatment, the patient has consented to  Procedure(s): BREAST LUMPECTOMY WITH RADIOACTIVE SEED AND SENTINEL LYMPH NODE BIOPSY (Right) as a surgical intervention .  The patient's history has been reviewed, patient examined, no change in status, stable for surgery.  I have reviewed the patient's chart and labs.  Questions were answered to the patient's satisfaction.     Center

## 2017-08-23 NOTE — Anesthesia Procedure Notes (Signed)
Procedure Name: LMA Insertion Date/Time: 08/23/2017 7:42 AM Performed by: Marrianne Mood, CRNA Pre-anesthesia Checklist: Patient identified, Emergency Drugs available, Suction available, Patient being monitored and Timeout performed Patient Re-evaluated:Patient Re-evaluated prior to induction Oxygen Delivery Method: Circle system utilized Preoxygenation: Pre-oxygenation with 100% oxygen Induction Type: IV induction Ventilation: Mask ventilation without difficulty LMA: LMA inserted LMA Size: 4.0 Number of attempts: 1 Airway Equipment and Method: Bite block Placement Confirmation: positive ETCO2 Tube secured with: Tape Dental Injury: Teeth and Oropharynx as per pre-operative assessment

## 2017-08-23 NOTE — Discharge Instructions (Signed)
[facility] °Office Phone Number [phone] ° °BREAST BIOPSY/ PARTIAL MASTECTOMY: POST OP INSTRUCTIONS ° °Always review your discharge instruction sheet given to you by the facility where your surgery was performed. ° °IF YOU HAVE DISABILITY OR FAMILY LEAVE FORMS, YOU MUST BRING THEM TO THE OFFICE FOR PROCESSING.  DO NOT GIVE THEM TO YOUR DOCTOR. ° °1. A prescription for pain medication may be given to you upon discharge.  Take your pain medication as prescribed, if needed.  If narcotic pain medicine is not needed, then you may take acetaminophen (Tylenol) or ibuprofen (Advil) as needed. °2. Take your usually prescribed medications unless otherwise directed °3. If you need a refill on your pain medication, please contact your pharmacy.  They will contact our office to request authorization.  Prescriptions will not be filled after 5pm or on week-ends. °4. You should eat very light the first 24 hours after surgery, such as soup, crackers, pudding, etc.  Resume your normal diet the day after surgery. °5. Most patients will experience some swelling and bruising in the breast.  Ice packs and a good support bra will help.  Swelling and bruising can take several days to resolve.  °6. It is common to experience some constipation if taking pain medication after surgery.  Increasing fluid intake and taking a stool softener will usually help or prevent this problem from occurring.  A mild laxative (Milk of Magnesia or Miralax) should be taken according to package directions if there are no bowel movements after 48 hours. °7. Unless discharge instructions indicate otherwise, you may remove your bandages 24-48 hours after surgery, and you may shower at that time.  You may have steri-strips (small skin tapes) in place directly over the incision.  These strips should be left on the skin for 7-10 days.  If your surgeon used skin glue on the incision, you may shower in 24 hours.  The glue will flake off over the  next 2-3 weeks.  Any sutures or staples will be removed at the office during your follow-up visit. °8. ACTIVITIES:  You may resume regular daily activities (gradually increasing) beginning the next day.  Wearing a good support bra or sports bra minimizes pain and swelling.  You may have sexual intercourse when it is comfortable. °a. You may drive when you no longer are taking prescription pain medication, you can comfortably wear a seatbelt, and you can safely maneuver your car and apply brakes. °b. RETURN TO WORK:  ______________________________________________________________________________________ °9. You should see your doctor in the office for a follow-up appointment approximately two weeks after your surgery.  Your doctor’s nurse will typically make your follow-up appointment when she calls you with your pathology report.  Expect your pathology report 2-3 business days after your surgery.  You may call to check if you do not hear from us after three days. °10. OTHER INSTRUCTIONS: _______________________________________________________________________________________________ _____________________________________________________________________________________________________________________________________ °_____________________________________________________________________________________________________________________________________ °_____________________________________________________________________________________________________________________________________ ° °WHEN TO CALL YOUR DOCTOR: °1. Fever over 101.0 °2. Nausea and/or vomiting. °3. Extreme swelling or bruising. °4. Continued bleeding from incision. °5. Increased pain, redness, or drainage from the incision. ° °The clinic staff is available to answer your questions during regular business hours.  Please don’t hesitate to call and ask to speak to one of the nurses for clinical concerns.  If you have a medical emergency, go to the nearest  emergency room or call 911.  A surgeon from [facility] is always on call at the hospital. ° °For further questions, please visit [email or web address]  ° ° ° ° ° ° °  Post Anesthesia Home Care Instructions ° °Activity: °Get plenty of rest for the remainder of the day. A responsible individual must stay with you for 24 hours following the procedure.  °For the next 24 hours, DO NOT: °-Drive a car °-Operate machinery °-Drink alcoholic beverages °-Take any medication unless instructed by your physician °-Make any legal decisions or sign important papers. ° °Meals: °Start with liquid foods such as gelatin or soup. Progress to regular foods as tolerated. Avoid greasy, spicy, heavy foods. If nausea and/or vomiting occur, drink only clear liquids until the nausea and/or vomiting subsides. Call your physician if vomiting continues. ° °Special Instructions/Symptoms: °Your throat may feel dry or sore from the anesthesia or the breathing tube placed in your throat during surgery. If this causes discomfort, gargle with warm salt water. The discomfort should disappear within 24 hours. ° °If you had a scopolamine patch placed behind your ear for the management of post- operative nausea and/or vomiting: ° °1. The medication in the patch is effective for 72 hours, after which it should be removed.  Wrap patch in a tissue and discard in the trash. Wash hands thoroughly with soap and water. °2. You may remove the patch earlier than 72 hours if you experience unpleasant side effects which may include dry mouth, dizziness or visual disturbances. °3. Avoid touching the patch. Wash your hands with soap and water after contact with the patch. °  ° ° ° ° ° ° ° °Regional Anesthesia Blocks ° °1. Numbness or the inability to move the "blocked" extremity may last from 3-48 hours after placement. The length of time depends on the medication injected and your individual response to the medication. If the numbness is not going  away after 48 hours, call your surgeon. ° °2. The extremity that is blocked will need to be protected until the numbness is gone and the  Strength has returned. Because you cannot feel it, you will need to take extra care to avoid injury. Because it may be weak, you may have difficulty moving it or using it. You may not know what position it is in without looking at it while the block is in effect. ° °3. For blocks in the legs and feet, returning to weight bearing and walking needs to be done carefully. You will need to wait until the numbness is entirely gone and the strength has returned. You should be able to move your leg and foot normally before you try and bear weight or walk. You will need someone to be with you when you first try to ensure you do not fall and possibly risk injury. ° °4. Bruising and tenderness at the needle site are common side effects and will resolve in a few days. ° °5. Persistent numbness or new problems with movement should be communicated to the surgeon or the [facility] ([phone])/ [facility] ([phone]). °

## 2017-08-23 NOTE — Anesthesia Procedure Notes (Signed)
Anesthesia Regional Block: Pectoralis block   Pre-Anesthetic Checklist: ,, timeout performed, Correct Patient, Correct Site, Correct Laterality, Correct Procedure, Correct Position, site marked, Risks and benefits discussed,  Surgical consent,  Pre-op evaluation,  At surgeon's request and post-op pain management  Laterality: Right  Prep: chloraprep       Needles:  Injection technique: Single-shot  Needle Type: Echogenic Needle     Needle Length: 9cm  Needle Gauge: 21     Additional Needles:   Narrative:  Start time: 08/23/2017 7:03 AM End time: 08/23/2017 7:12 AM Injection made incrementally with aspirations every 5 mL.  Performed by: Personally  Anesthesiologist: Albertha Ghee, MD  Additional Notes: Pt tolerated the procedure well.

## 2017-08-23 NOTE — Anesthesia Preprocedure Evaluation (Signed)
Anesthesia Evaluation  Patient identified by MRN, date of birth, ID band Patient awake    Reviewed: Allergy & Precautions, H&P , NPO status , Patient's Chart, lab work & pertinent test results  Airway Mallampati: II   Neck ROM: full    Dental   Pulmonary former smoker,    breath sounds clear to auscultation       Cardiovascular negative cardio ROS   Rhythm:regular Rate:Normal     Neuro/Psych PSYCHIATRIC DISORDERS Anxiety    GI/Hepatic GERD  ,  Endo/Other    Renal/GU One kidney     Musculoskeletal  (+) Arthritis ,   Abdominal   Peds  Hematology   Anesthesia Other Findings   Reproductive/Obstetrics Breast CA                             Anesthesia Physical Anesthesia Plan  ASA: II  Anesthesia Plan: General and Regional   Post-op Pain Management:  Regional for Post-op pain   Induction: Intravenous  PONV Risk Score and Plan: 3 and Ondansetron, Dexamethasone, Midazolam and Treatment may vary due to age or medical condition  Airway Management Planned: LMA  Additional Equipment:   Intra-op Plan:   Post-operative Plan:   Informed Consent: I have reviewed the patients History and Physical, chart, labs and discussed the procedure including the risks, benefits and alternatives for the proposed anesthesia with the patient or authorized representative who has indicated his/her understanding and acceptance.     Plan Discussed with: CRNA, Anesthesiologist and Surgeon  Anesthesia Plan Comments:         Anesthesia Quick Evaluation

## 2017-08-23 NOTE — Progress Notes (Signed)
Assisted nuc med tech with nuc med injections. Side rails up, monitors on throughout procedure. See vital signs in flow sheet. Tolerated Procedure well. 

## 2017-08-23 NOTE — Transfer of Care (Signed)
Immediate Anesthesia Transfer of Care Note  Patient: Melissa [name]  Procedure(s) Performed: BREAST LUMPECTOMY WITH RADIOACTIVE SEED AND SENTINEL LYMPH NODE BIOPSY (Right Breast)  Patient Location: PACU  Anesthesia Type:GA combined with regional for post-op pain  Level of Consciousness: awake and patient cooperative  Airway & Oxygen Therapy: Patient Spontanous Breathing and Patient connected to face mask oxygen  Post-op Assessment: Report given to RN and Post -op Vital signs reviewed and stable  Post vital signs: Reviewed and stable  Last Vitals:  Vitals Value Taken Time  BP 130/67 08/23/2017  8:45 AM  Temp    Pulse 104 08/23/2017  8:46 AM  Resp 19 08/23/2017  8:46 AM  SpO2 100 % 08/23/2017  8:46 AM  Vitals shown include unvalidated device data.  Last Pain:  Vitals:   08/23/17 0635  TempSrc: Oral  PainSc: 0-No pain         Complications: No apparent anesthesia complications

## 2017-08-24 ENCOUNTER — Encounter (HOSPITAL_BASED_OUTPATIENT_CLINIC_OR_DEPARTMENT_OTHER): Payer: Self-pay | Admitting: Surgery

## 2017-08-25 ENCOUNTER — Telehealth: Payer: Self-pay | Admitting: *Deleted

## 2017-08-25 NOTE — Telephone Encounter (Signed)
Ordered oncotype per Dr. Magrinat.  Faxed requisition to pathology and confirmed receipt.  

## 2017-08-30 ENCOUNTER — Other Ambulatory Visit (HOSPITAL_COMMUNITY): Payer: Medicare Other

## 2017-09-05 ENCOUNTER — Telehealth: Payer: Self-pay | Admitting: *Deleted

## 2017-09-05 DIAGNOSIS — C50411 Malignant neoplasm of upper-outer quadrant of right female breast: Secondary | ICD-10-CM

## 2017-09-05 DIAGNOSIS — Z170 Estrogen receptor positive status [ER+]: Principal | ICD-10-CM

## 2017-09-05 NOTE — Telephone Encounter (Signed)
Received oncotype results of 12.  Left message for a return phone call to inform patient of results. Referral placed for Dr. Lisbeth Renshaw.

## 2017-09-08 ENCOUNTER — Encounter: Payer: Self-pay | Admitting: Oncology

## 2017-09-08 ENCOUNTER — Encounter: Payer: Self-pay | Admitting: Radiation Oncology

## 2017-09-08 NOTE — Progress Notes (Signed)
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 Lymph node, needle/core biopsy - THERE IS NO EVIDENCE OF CARCINOMA IN ONE OF ONE LYMPH NODE (0/1). 2. Breast, right, needle core biopsy - INVASIVE DUCTAL CARCINOMA WITH CALCIFICATIONS. - DUCTAL CARCINOMA IN SITU WITH CALCIFICATIONS. - SEE COMMENT. Microscopic Comment 1. and 2. The carcinoma appears grade 2. A breast prognostic profile will be performed and the results reported separately. The results are called to [facility] on 07/20/17. (JBK  Receptor Status: ER(95 5 +), PR (95 % +), Her2-neu (-ratios HER2/CEP17 signals 1.22 and average HER2 copies per cell 1.95 ), Ki-(10 %)  Did patient present with symptoms (if so, please note symptoms) or was this found on screening mammography?: Routine screening   Past/Anticipated interventions by surgeon, if any:  FINAL DIAGNOSIS Diagnosis 08-23-17 Dr. [name] Cornett 1. Breast, lumpectomy, Right w/seed - INVASIVE DUCTAL CARCINOMA, GRADE 2, SPANNING 1.7 CM. - LOW GRADE DUCTAL CARCINOMA IN SITU. - RESECTION MARGINS ARE NEGATIVE. - BIOPSY SITE. - SEE ONCOLOGY TABLE. 2. Breast, excision, Right additional Anterior Margin - BENIGN BREAST TISSUE. 3. Lymph node, sentinel, biopsy, Right Axillary - ONE OF ONE LYMPH NODES NEGATIVE FOR CARCINOMA (0/1). 4. Lymph node, sentinel, biopsy - ONE OF ONE LYMPH NODES NEGATIVE FOR CARCINOMA (0/1). 5. Lymph node, sentinel, biopsy - ONE OF ONE LYMPH NODES NEGATIVE FOR CARCINOMA (0/1). 6. Lymph node, sentinel, biopsy - ONE OF ONE LYMPH NODES NEGATIVE FOR CARCINOMA (0/1). 7. Lymph node, sentinel, biopsy - ONE OF ONE LYMPH NODES NEGATIVE FOR CARCINOMA (0/1). Microscopic Comment 1. INVASIVE CARCINOMA OF THE BREAST: Resection Procedure: Right breast lumpectomy, additional  Microscopic Comment(continued) Ductal Carcinoma In Situ: Present, low grade.  Receptor Status:  ER(95 % +), PR (95 % +), Her2-neu (- ratios HER2/CEP17 signals 1.22 and average HER2 copies per cell 1.95), Ki-(10 %)  Specimen Gross and Clinical Past/Anticipated interventions by medical oncology, if any: Dr. Jana Hakim  Chemotherapy No 08-23-17 Oncotype DX Result 12 adjuvant radiation, and adjuvant antiestrogens  Lymphedema issues, if any: No  ROM to right arm good. Will have a PT evaluation on 09-22-17 Skin to right breast healing bruising noted along the incision area. Skin senistive to touch. Follow up appointment with Dr. [name] Cornett  Pain issues, if any:2/10 taking  Tylenol  SAFETY ISSUES:  Prior radiation? :No  Pacemaker/ICD?:No  Possible current pregnancy?:NO  Is the patient on methotrexate?:  No LMP recorded. Menarche: 67 years old Age at first live birth: 66 years old She is GX P1. Her LMP was in 2008. She used oral contraception with no complications. She did not used HRT.  Current Complaints / other details:    Wt Readings from Last 3 Encounters:  09/13/17 172 lb 6.4 oz (78.2 kg)  08/23/17 172 lb (78 kg)  07/27/17 168 lb 11.2 oz (76.5 kg)  BP (!) 143/79 (BP Location: Left Arm)   Pulse 77   Temp 97.9 F (36.6 C) (Oral)   Resp 17   Ht 5' 4"  (1.191 m)   Wt 172 lb 6.4 oz (78.2 kg)   SpO2 96%   BMI 29.59 kg/m   Georgena Spurling, RN 09/08/2017,11:13 AM

## 2017-09-09 ENCOUNTER — Encounter (HOSPITAL_COMMUNITY): Payer: Self-pay | Admitting: Oncology

## 2017-09-13 ENCOUNTER — Ambulatory Visit
Admission: RE | Admit: 2017-09-13 | Discharge: 2017-09-13 | Disposition: A | Discharge status: Home / Self Care | Payer: Medicare Other | Source: Ambulatory Visit | Attending: Radiation Oncology | Admitting: Radiation Oncology

## 2017-09-13 ENCOUNTER — Encounter: Payer: Self-pay | Admitting: Radiation Oncology

## 2017-09-13 ENCOUNTER — Other Ambulatory Visit: Payer: Self-pay

## 2017-09-13 VITALS — BP 143/79 | HR 77 | Temp 97.9°F | Resp 17 | Ht 64.0 in | Wt 172.4 lb

## 2017-09-13 DIAGNOSIS — Z86711 Personal history of pulmonary embolism: Secondary | ICD-10-CM | POA: Diagnosis not present

## 2017-09-13 DIAGNOSIS — C50411 Malignant neoplasm of upper-outer quadrant of right female breast: Secondary | ICD-10-CM | POA: Diagnosis not present

## 2017-09-13 DIAGNOSIS — Z881 Allergy status to other antibiotic agents status: Secondary | ICD-10-CM | POA: Diagnosis not present

## 2017-09-13 DIAGNOSIS — Z79899 Other long term (current) drug therapy: Secondary | ICD-10-CM | POA: Insufficient documentation

## 2017-09-13 DIAGNOSIS — Z170 Estrogen receptor positive status [ER+]: Secondary | ICD-10-CM | POA: Diagnosis not present

## 2017-09-13 DIAGNOSIS — Z87891 Personal history of nicotine dependence: Secondary | ICD-10-CM | POA: Diagnosis not present

## 2017-09-13 NOTE — Progress Notes (Signed)
  Radiation Oncology         [phone] ________________________________  Name: Melissa [name] MRN: [id number]  Date: 09/13/2017  DOB: 1951-01-08  Optical Surface Tracking Plan:  Since intensity modulated radiotherapy (IMRT) and 3D conformal radiation treatment methods are predicated on accurate and precise positioning for treatment, intrafraction motion monitoring is medically necessary to ensure accurate and safe treatment delivery.  The ability to quantify intrafraction motion without excessive ionizing radiation dose can only be performed with optical surface tracking. Accordingly, surface imaging offers the opportunity to obtain 3D measurements of patient position throughout IMRT and 3D treatments without excessive radiation exposure.  I am ordering optical surface tracking for this patient's upcoming course of radiotherapy. ________________________________  Kyung Rudd, MD 09/13/2017 5:09 PM    Reference:   Particia Jasper, et al. Surface imaging-based analysis of intrafraction motion for breast radiotherapy patients.Journal of [place], n. 6, nov. 2014. ISSN [id number].   Available at: <http://www.jacmp.org/index.php/jacmp/article/view/4957>.

## 2017-09-13 NOTE — Progress Notes (Signed)
  Radiation Oncology         [phone] ________________________________  Name: Melissa [name] MRN: [id number]  Date: 09/13/2017  DOB: [birth year]   DIAGNOSIS:     ICD-10-CM   1. Malignant neoplasm of upper-outer quadrant of right breast in female, estrogen receptor positive ([place]) C50.411    Z17.0     SIMULATION AND TREATMENT PLANNING NOTE  The patient presented for simulation prior to beginning her course of radiation treatment for her diagnosis of right-sided breast cancer. The patient was placed in a supine position on a breast board. A customized vac-lock bag was constructed and this complex treatment device will be used on a daily basis during her treatment. In this fashion, a CT scan was obtained through the chest area and an isocenter was placed near the chest wall within the breast.  The patient will be planned to receive a course of radiation initially to a dose of 42.56 Gy. This will consist of a whole breast radiotherapy technique. To accomplish this, 2 customized blocks have been designed which will correspond to medial and lateral whole breast tangent fields. This treatment will be accomplished at 2.66 Gy per fraction. A forward planning technique will also be evaluated to determine if this approach improves the plan. It is anticipated that the patient will then receive a 8 Gy boost to the seroma cavity which has been contoured. This will be accomplished at 2 Gy per fraction.   This initial treatment will consist of a 3-D conformal technique. The seroma has been contoured as the primary target structure. Additionally, dose volume histograms of both this target as well as the lungs and heart will also be evaluated. Such an approach is necessary to ensure that the target area is adequately covered while the nearby critical  normal structures are adequately spared.  Plan:  The final anticipated total dose therefore will correspond to 50.56  Gy.    _______________________________   Jodelle Gross, MD, PhD

## 2017-09-13 NOTE — Addendum Note (Signed)
Encounter addended by: Malena Edman, RN on: 09/13/2017 3:47 PM  Actions taken: Visit Navigator Flowsheet section accepted

## 2017-09-13 NOTE — Progress Notes (Signed)
Radiation Oncology         [phone] ________________________________  Name: Melissa [name]        MRN: [id number]  Date of Service: 09/13/2017 DOB: 1950-09-21  NO:[id number], [facility]  Erroll Luna, MD     REFERRING PHYSICIAN: Erroll Luna, MD   DIAGNOSIS: There were no encounter diagnoses.   HISTORY OF PRESENT ILLNESS: Melissa [name] is a 67 y.o. female seen in the multidisciplinary breast clinic for a new diagnosis of right breast cancer. The patient was noted to have a mass seen on screening mammogram in the right upper outer quadrant. She had additional diagnostic imaging including ultrasound which located the mass at 10:00 and measured 1.6 cm in greatest dimension. She had three nodes visible by ultrasound and one of the three had increased cortical thickening. A biopsy on 07/14/17 revealed a grade 2, invasive ductal carcinoma of the breast. The tumor was ER/PR positive, HER2 negative, Ki 67 of 10%. Her sampled node seen on ultrasound was negative for disease.  She underwent right lumpectomy on 08/23/2017 with sentinel lymph node biopsy.  Final pathology revealed a grade 2, 1.7 cm invasive ductal carcinoma.  Her margins were negative, 5 sampled nodes were negative for disease.  Her Oncotype score was 12 and there is no need for chemotherapy based on this.  She comes today to discuss the role of adjuvant radiotherapy.   PREVIOUS RADIATION THERAPY: No   PAST MEDICAL HISTORY:  Past Medical History:  Diagnosis Date  . Anxiety   . Arthritis    shoulders, hands  . Cancer ([place]) 07/2017   right breast cancer  . Chest pain   . Diverticulosis   . GERD (gastroesophageal reflux disease)   . High cholesterol   . IBS (irritable bowel syndrome)   . Kidney disorder    Pt states only has left kidney  . Panic attacks   . Pulmonary embolism ([place]) 2018   pt states resolved and no longer takes blood thinners       PAST SURGICAL HISTORY: Past Surgical History:    Procedure Laterality Date  . BREAST LUMPECTOMY WITH RADIOACTIVE SEED AND SENTINEL LYMPH NODE BIOPSY Right 08/23/2017   Procedure: BREAST LUMPECTOMY WITH RADIOACTIVE SEED AND SENTINEL LYMPH NODE BIOPSY;  Surgeon: Erroll Luna, MD;  Location: [facility];  Service: General;  Laterality: Right;  . TUBAL LIGATION       FAMILY HISTORY:  Family History  Problem Relation Age of Onset  . Alzheimer's disease Mother   . Stroke Father 63  . Heart attack Maternal Grandmother   . Heart attack Paternal Grandmother   . Cervical cancer Cousin   . Colon cancer Paternal Aunt   . Colon cancer Cousin      SOCIAL HISTORY:  reports that she quit smoking about 34 years ago. Her smoking use included cigarettes. She has a 20.00 pack-year smoking history. She has never used smokeless tobacco. She reports that she drinks alcohol. She reports that she does not use drugs.  The patient is widowed and lives in [place]. Her husband was diagnosed with and passed from pancreatic cancer in the last year.   ALLERGIES: Clindamycin   MEDICATIONS:  Current Outpatient Medications  Medication Sig Dispense Refill  . acetaminophen (TYLENOL) 500 MG tablet Take 1,000 mg by mouth every 6 (six) hours as needed for mild pain.    Marland Kitchen ALPRAZolam (XANAX) 0.25 MG tablet 3 (three) times daily. TAKE 1/2 TABLET IN THE MORNING AND 1/2 IN  THE EVENING AT BEDTIME    . calcium carbonate (TUMS - DOSED IN MG ELEMENTAL CALCIUM) 500 MG chewable tablet Chew 1 tablet by mouth 2 (two) times daily. 600 mg twice a day    . omeprazole (PRILOSEC) 20 MG capsule Take 20 mg by mouth 2 (two) times daily before a meal.    . hydrocortisone cream 1 % Apply 1 application topically as needed for itching.    . hyoscyamine (ANASPAZ) 0.125 MG TBDP disintergrating tablet Take 0.125 mg by mouth every 6 (six) hours as needed.  3  . oxyCODONE (OXY IR/ROXICODONE) 5 MG immediate release tablet Take 1 tablet (5 mg total) by mouth every 6 (six) hours as  needed for severe pain. (Patient not taking: Reported on 09/13/2017) 10 tablet 0  . rosuvastatin (CRESTOR) 5 MG tablet Take 5 mg 1 tablet on Monday-Wednesday-Friday (Patient not taking: Reported on 09/13/2017) 90 tablet 3   No current facility-administered medications for this encounter.      REVIEW OF SYSTEMS: On review of systems, the patient reports that she is doing well overall.  She does note some soreness and discomfort in the lateral aspect of her breast when trying to wear a sports bra.  She is curious about trying to find something that would be more comfortable.  She denies any chest pain, shortness of breath, cough, fevers, chills, night sweats, unintended weight changes. She denies any bowel or bladder disturbances, and denies abdominal pain, nausea or vomiting. She denies any new musculoskeletal or joint aches or pains. A complete review of systems is obtained and is otherwise negative.     PHYSICAL EXAM:  Wt Readings from Last 3 Encounters:  09/13/17 172 lb 6.4 oz (78.2 kg)  08/23/17 172 lb (78 kg)  07/27/17 168 lb 11.2 oz (76.5 kg)   Temp Readings from Last 3 Encounters:  09/13/17 97.9 F (36.6 C) (Oral)  08/23/17 97.7 F (36.5 C)  07/27/17 97.8 F (36.6 C) (Oral)   BP Readings from Last 3 Encounters:  09/13/17 (!) 143/79  08/23/17 (!) 144/76  07/27/17 137/66   Pulse Readings from Last 3 Encounters:  09/13/17 77  08/23/17 97  07/27/17 86     In general this is a well appearing caucasian female in no acute distress. She is alert and oriented x4 and appropriate throughout the examination. HEENT reveals that the patient is normocephalic, atraumatic. EOMs are intact.  Cardiopulmonary assessment is negative for acute distress and she exhibits normal effort.  The right breast incision is assessed, and there is a well-healing lateral incision in the upper outer quadrant of the right breast.  No evidence of cellulitic streaking or bleeding is noted.  No fluctuance is noted  specifically, no evidence of engorgement of the breast is identified, though her breasts are large and pendulous.  No evidence of edema of the upper extremity on the right is noted.    ECOG = 1  0 - Asymptomatic (Fully active, able to carry on all predisease activities without restriction)  1 - Symptomatic but completely ambulatory (Restricted in physically strenuous activity but ambulatory and able to carry out work of a light or sedentary nature. For example, light housework, office work)  2 - Symptomatic, <50% in bed during the day (Ambulatory and capable of all self care but unable to carry out any work activities. Up and about more than 50% of waking hours)  3 - Symptomatic, >50% in bed, but not bedbound (Capable of only limited self-care, confined to bed  or chair 50% or more of waking hours)  4 - Bedbound (Completely disabled. Cannot carry on any self-care. Totally confined to bed or chair)  5 - Death   Eustace Pen MM, Creech RH, Tormey DC, et al. [phone]). "Toxicity and response criteria of the [facility]". [place] Oncol. 5 (6): 649-55    LABORATORY DATA:  Lab Results  Component Value Date   WBC 7.5 07/27/2017   HGB 13.5 07/27/2017   HCT 40.0 07/27/2017   MCV 92.8 07/27/2017   PLT 281 07/27/2017   Lab Results  Component Value Date   NA 140 07/27/2017   K 4.0 07/27/2017   CL 104 07/27/2017   CO2 28 07/27/2017   Lab Results  Component Value Date   ALT 19 07/27/2017   AST 18 07/27/2017   ALKPHOS 70 07/27/2017   BILITOT 0.5 07/27/2017      RADIOGRAPHY: Nm Sentinel Node Inj-no Rpt (breast)  Result Date: 08/23/2017 Sulfur colloid was injected by the nuclear medicine technologist for melanoma sentinel node.       IMPRESSION/PLAN: 1. 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 Dr. Lisbeth Renshaw discusses the final pathology findings and reviews the nature of invasive breast disease.  She appears to be doing  well since her surgery, and is ready to proceed with radiotherapy planning.  We discussed the risks, benefits, short, and long term effects of radiotherapy, and the patient is interested in proceeding. Dr. Lisbeth Renshaw discusses the delivery and logistics of radiotherapy and anticipates a course of 4 weeks of radiotherapy. Written consent is obtained and placed in the chart, a copy was provided to the patient. She will simulate this afternoon.  2. Breast soreness.  The patient was encouraged to contact second to nature for formal measurements and bra fitting.  We will follow this expectantly.  In a visit lasting 25 minutes, greater than 50% of the time was spent face to face discussing her case, and coordinating the patient's care.  The above documentation reflects my direct findings during this shared patient visit. Please see the separate note by Dr. Lisbeth Renshaw on this date for the remainder of the patient's plan of care.    Carola Rhine, PAC

## 2017-09-15 DIAGNOSIS — C50411 Malignant neoplasm of upper-outer quadrant of right female breast: Secondary | ICD-10-CM | POA: Diagnosis not present

## 2017-09-16 DIAGNOSIS — C50411 Malignant neoplasm of upper-outer quadrant of right female breast: Secondary | ICD-10-CM | POA: Diagnosis not present

## 2017-09-22 ENCOUNTER — Ambulatory Visit: Payer: Medicare Other | Attending: Surgery | Admitting: Physical Therapy

## 2017-09-22 ENCOUNTER — Other Ambulatory Visit: Payer: Self-pay

## 2017-09-22 DIAGNOSIS — Z483 Aftercare following surgery for neoplasm: Secondary | ICD-10-CM | POA: Insufficient documentation

## 2017-09-22 DIAGNOSIS — Z170 Estrogen receptor positive status [ER+]: Secondary | ICD-10-CM | POA: Diagnosis present

## 2017-09-22 DIAGNOSIS — C50411 Malignant neoplasm of upper-outer quadrant of right female breast: Secondary | ICD-10-CM

## 2017-09-22 DIAGNOSIS — R293 Abnormal posture: Secondary | ICD-10-CM | POA: Insufficient documentation

## 2017-09-22 NOTE — Therapy (Signed)
[place], [place], [address] Phone: [phone]   Fax:  [phone]  Physical Therapy Treatment  Patient Details  Name: Melissa [name] MRN: [id number] Date of Birth: 10-Aug-1950 Referring Provider: Dr. Erroll Luna   Encounter Date: 09/22/2017  PT End of Session - 09/22/17 1216    Visit Number  2    Number of Visits  2    PT Start Time  1100    PT Stop Time  1146    PT Time Calculation (min)  46 min    Activity Tolerance  Patient tolerated treatment well    Behavior During Therapy  [facility] for tasks assessed/performed       Past Medical History:  Diagnosis Date  . Anxiety   . Arthritis    shoulders, hands  . Cancer ([place]) 07/2017   right breast cancer  . Chest pain   . Diverticulosis   . GERD (gastroesophageal reflux disease)   . High cholesterol   . IBS (irritable bowel syndrome)   . Kidney disorder    Pt states only has left kidney  . Panic attacks   . Pulmonary embolism ([place]) 2018   pt states resolved and no longer takes blood thinners    Past Surgical History:  Procedure Laterality Date  . BREAST LUMPECTOMY WITH RADIOACTIVE SEED AND SENTINEL LYMPH NODE BIOPSY Right 08/23/2017   Procedure: BREAST LUMPECTOMY WITH RADIOACTIVE SEED AND SENTINEL LYMPH NODE BIOPSY;  Surgeon: Erroll Luna, MD;  Location: [place];  Service: General;  Laterality: Right;  . TUBAL LIGATION      There were no vitals filed for this visit.  Subjective Assessment - 09/22/17 1105    Subjective  Patient underwent a right lumpectomy and sentinel node biopsy (0/5 nodes positive) on 08/23/17. Her Oncotype was low so no need for chemotherapy but will begin radiation on 09/27/17 for 4 weeks. She will also undergo anti-estrogen therapy.    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 Patient  underwent a right lumpectomy and sentinel node biopsy (0/5 nodes positive) on 08/23/17. She was hospitalized in 01/2017 with diverticulitis and also had a pulmonary embolus. She recently lost her husband in 10/2016 due to pancreatic cancer.    Patient Stated Goals  Make sure my arm is ok    Currently in Pain?  Yes    Pain Score  3     Pain Location  Axilla    Pain Orientation  Right    Pain Descriptors / Indicators  Sharp;Tightness    Pain Type  Surgical pain    Pain Onset  1 to 4 weeks ago    Pain Frequency  Intermittent    Aggravating Factors   Unknown    Pain Relieving Factors  Stretching    Multiple Pain Sites  No         [facility] PT Assessment - 09/22/17 0001      Assessment   Medical Diagnosis  s/p right lumpectomy and SLNB    Referring Provider  Dr. [name] [place]    Onset Date/Surgical Date  08/23/17    Hand Dominance  Right    Prior Therapy  Baselines      Precautions   Precautions  Other (comment)    Precaution Comments  Right arm lymphedema risk      Restrictions   Weight Bearing Restrictions  No      Balance Screen   Has the patient fallen in the past 6 months  No    Has the patient had a decrease in activity level because of a fear of falling?   No    Is the patient reluctant to leave their home because of a fear of falling?   No      Home Environment   Living Environment  Private residence    Living Arrangements  Alone    Available Help at Discharge  Family      Prior Function   Level of [place]  Retired    Leisure  She has been walking 30 minutes 2x/week      Cognition   Overall Cognitive Status  Within Functional Limits for tasks assessed      Observation/Other Assessments   Observations  Pt with only 1 incision on right lateral superior breast. Appears to be healing well. No sign of edema present.      Posture/Postural Control   Posture/Postural Control  Postural limitations    Postural Limitations  Rounded  Shoulders;Forward head      ROM / Strength   AROM / PROM / Strength  AROM      AROM   AROM Assessment Site  Shoulder    Right/Left Shoulder  Right    Right Shoulder Extension  44 Degrees    Right Shoulder Flexion  150 Degrees    Right Shoulder ABduction  162 Degrees    Right Shoulder Internal Rotation  65 Degrees    Right Shoulder External Rotation  80 Degrees        LYMPHEDEMA/ONCOLOGY QUESTIONNAIRE - 09/22/17 1115      Type   Cancer Type  Right breast cancer      Surgeries   Lumpectomy Date  08/23/17    Sentinel Lymph Node Biopsy Date  08/23/17    Number Lymph Nodes Removed  3      Treatment   Active Chemotherapy Treatment  No    Past Chemotherapy Treatment  No    Active Radiation Treatment  No    Past Radiation Treatment  No    Current Hormone Treatment  No    Past Hormone Therapy  No      What other symptoms do you have   Are you Having Heaviness or Tightness  Yes    Are you having Pain  Yes    Are you having pitting edema  No    Is it Hard or Difficult finding clothes that fit  No    Do you have infections  No    Is there Decreased scar mobility  No    Stemmer Sign  No      Lymphedema Assessments   Lymphedema Assessments  Upper extremities      Right Upper Extremity Lymphedema   10 cm Proximal to Olecranon Process  27.8 cm    Olecranon Process  25.4 cm    10 cm Proximal to Ulnar Styloid Process  20 cm    Just Proximal to Ulnar Styloid Process  15 cm    Across Hand at [employer]  17.8 cm    At [place] of 2nd Digit  5.7 cm      Left Upper Extremity Lymphedema   10 cm Proximal to Olecranon Process  26.2 cm    Olecranon Process  23.5 cm    10 cm Proximal to Ulnar Styloid Process  20.5 cm    Just Proximal to Ulnar Styloid Process  14.4 cm    Across Hand at [employer]  16.4 cm    At [place] of 2nd Digit  5.7 cm        Quick Dash - 09/22/17 0001    Open a tight or new jar  Mild difficulty    Do heavy household chores (wash walls, wash floors)   Mild difficulty    Carry a shopping bag or briefcase  Mild difficulty    Wash your back  No difficulty    Use a knife to cut food  No difficulty    Recreational activities in which you take some force or impact through your arm, shoulder, or hand (golf, hammering, tennis)  Mild difficulty    During the past week, to what extent has your arm, shoulder or hand problem interfered with your normal social activities with family, friends, neighbors, or groups?  Not at all    During the past week, to what extent has your arm, shoulder or hand problem limited your work or other regular daily activities  Slightly    Arm, shoulder, or hand pain.  Mild    Tingling (pins and needles) in your arm, shoulder, or hand  None    Difficulty Sleeping  Mild difficulty    DASH Score  15.91 %                     PT Education - 09/22/17 1130    Education Details  Education on where and how to obtain a compression bra to reduce soreness on lateral breast.    Person(s) Educated  Patient    Methods  Explanation    Comprehension  Verbalized understanding          PT Long Term Goals - 09/22/17 1219      PT LONG TERM GOAL #1   Title  Patient will demonstrate she has returned to baseline related to shoulder ROM and function.    Time  8    Period  Weeks    Status  Achieved            Plan - 09/22/17 1217    Clinical Impression Statement  Patient is doing very well s.p right lumpectomy and sentinel node biopsy. She had no positive axillary nodes and will start radiation 10/10/17. She reports feling some swelling in her right breast but there is no visible edema present today. Her incision is well healed, her shoulder ROM is back to baseline, and she reports no funcitonal limitations. No further PT needed at this time.    PT Treatment/Interventions  ADLs/Self Care Home Management;Therapeutic exercise;Patient/family education    PT Next Visit Plan  D/C - will attend ABC class 10/31/17    PT Home  Exercise Plan  Post op shoulder ROM HEP    Consulted and Agree with Plan of Care  Patient       Patient will benefit from skilled therapeutic intervention in order to improve the following deficits and impairments:  Decreased knowledge of precautions, Decreased range of motion, Postural dysfunction, Pain  Visit Diagnosis: Malignant neoplasm of upper-outer quadrant of right breast in female, estrogen receptor positive ([place])  Abnormal posture  Aftercare following surgery for neoplasm     Problem List Patient Active Problem List   Diagnosis Date Noted  . Pulmonary embolism ([place]) 07/27/2017  . Osteopenia 07/27/2017  . Malignant neoplasm of upper-outer quadrant of right breast in  female, estrogen receptor positive ([place]) 07/22/2017  . Pure hypercholesterolemia 07/14/2017  . Palpitations 04/18/2017  . Shortness of breath 04/18/2017  . Atypical chest pain 03/26/2017  . Leukocytosis 03/26/2017  . Infected tooth 03/26/2017  . Panic attacks   . GERD (gastroesophageal reflux disease)   . Diverticulosis   . Anxiety   . Kidney disorder     PHYSICAL THERAPY DISCHARGE SUMMARY  Visits from Start of Care: 2  Current functional level related to goals / functional outcomes: Goals met. Pt doing well.   Remaining deficits: None   Education / Equipment: HEP and lymphedema risk reduction education.  Plan: Patient agrees to discharge.  Patient goals were met. Patient is being discharged due to meeting the stated rehab goals.  ?????         Annia Friendly, [place] 09/22/17 12:20 PM   [place], [place], [address] Phone: [phone]   Fax:  [phone]  Name: Melissa [name] MRN: [id number] Date of Birth: November 02, 1950

## 2017-09-27 ENCOUNTER — Ambulatory Visit
Admission: RE | Admit: 2017-09-27 | Discharge: 2017-09-27 | Disposition: A | Discharge status: Home / Self Care | Payer: Medicare Other | Source: Ambulatory Visit | Attending: Radiation Oncology | Admitting: Radiation Oncology

## 2017-09-27 DIAGNOSIS — C50411 Malignant neoplasm of upper-outer quadrant of right female breast: Secondary | ICD-10-CM | POA: Insufficient documentation

## 2017-09-27 DIAGNOSIS — Z170 Estrogen receptor positive status [ER+]: Secondary | ICD-10-CM | POA: Insufficient documentation

## 2017-09-28 ENCOUNTER — Ambulatory Visit
Admission: RE | Admit: 2017-09-28 | Discharge: 2017-09-28 | Disposition: A | Discharge status: Home / Self Care | Payer: Medicare Other | Source: Ambulatory Visit | Attending: Radiation Oncology | Admitting: Radiation Oncology

## 2017-09-28 DIAGNOSIS — C50411 Malignant neoplasm of upper-outer quadrant of right female breast: Secondary | ICD-10-CM | POA: Diagnosis not present

## 2017-09-29 ENCOUNTER — Ambulatory Visit
Admission: RE | Admit: 2017-09-29 | Discharge: 2017-09-29 | Disposition: A | Discharge status: Home / Self Care | Payer: Medicare Other | Source: Ambulatory Visit | Attending: Radiation Oncology | Admitting: Radiation Oncology

## 2017-09-29 DIAGNOSIS — C50411 Malignant neoplasm of upper-outer quadrant of right female breast: Secondary | ICD-10-CM | POA: Diagnosis not present

## 2017-09-30 ENCOUNTER — Ambulatory Visit
Admission: RE | Admit: 2017-09-30 | Discharge: 2017-09-30 | Disposition: A | Discharge status: Home / Self Care | Payer: Medicare Other | Source: Ambulatory Visit | Attending: Radiation Oncology | Admitting: Radiation Oncology

## 2017-09-30 DIAGNOSIS — C50411 Malignant neoplasm of upper-outer quadrant of right female breast: Secondary | ICD-10-CM

## 2017-09-30 DIAGNOSIS — Z170 Estrogen receptor positive status [ER+]: Principal | ICD-10-CM

## 2017-09-30 MED ORDER — ALRA NON-METALLIC DEODORANT (RAD-ONC)
1.0000 "application " | Freq: Once | TOPICAL | Status: AC
  Administered 2017-09-30: 1 via TOPICAL

## 2017-09-30 MED ORDER — RADIAPLEXRX EX GEL
Freq: Once | CUTANEOUS | Status: AC
  Administered 2017-09-30: 16:00:00 via TOPICAL

## 2017-09-30 NOTE — Progress Notes (Signed)
Pt here for patient teaching.  Pt given Radiation and You booklet, skin care instructions, [place] deodorant and Radiaplex gel.  Reviewed areas of pertinence such as fatigue, hair loss, skin changes, breast tenderness and breast swelling . Pt able to give teach back of to pat skin and use unscented/gentle soap,apply Radiaplex bid, avoid applying anything to skin within 4 hours of treatment and to use an electric razor if they must shave. Pt demonstrated understanding and verbalizes understanding of information given and will contact nursing with any questions or concerns.     Melissa [name]. Melissa [name], BSN

## 2017-10-03 ENCOUNTER — Ambulatory Visit
Admission: RE | Admit: 2017-10-03 | Discharge: 2017-10-03 | Disposition: A | Discharge status: Home / Self Care | Payer: Medicare Other | Source: Ambulatory Visit | Attending: Radiation Oncology | Admitting: Radiation Oncology

## 2017-10-03 DIAGNOSIS — C50411 Malignant neoplasm of upper-outer quadrant of right female breast: Secondary | ICD-10-CM | POA: Diagnosis not present

## 2017-10-04 ENCOUNTER — Ambulatory Visit
Admission: RE | Admit: 2017-10-04 | Discharge: 2017-10-04 | Disposition: A | Discharge status: Home / Self Care | Payer: Medicare Other | Source: Ambulatory Visit | Attending: Radiation Oncology | Admitting: Radiation Oncology

## 2017-10-04 DIAGNOSIS — C50411 Malignant neoplasm of upper-outer quadrant of right female breast: Secondary | ICD-10-CM | POA: Diagnosis not present

## 2017-10-05 ENCOUNTER — Ambulatory Visit
Admission: RE | Admit: 2017-10-05 | Discharge: 2017-10-05 | Disposition: A | Discharge status: Home / Self Care | Payer: Medicare Other | Source: Ambulatory Visit | Attending: Radiation Oncology | Admitting: Radiation Oncology

## 2017-10-05 DIAGNOSIS — C50411 Malignant neoplasm of upper-outer quadrant of right female breast: Secondary | ICD-10-CM | POA: Diagnosis not present

## 2017-10-06 ENCOUNTER — Ambulatory Visit
Admission: RE | Admit: 2017-10-06 | Discharge: 2017-10-06 | Disposition: A | Discharge status: Home / Self Care | Payer: Medicare Other | Source: Ambulatory Visit | Attending: Radiation Oncology | Admitting: Radiation Oncology

## 2017-10-06 DIAGNOSIS — C50411 Malignant neoplasm of upper-outer quadrant of right female breast: Secondary | ICD-10-CM | POA: Diagnosis not present

## 2017-10-07 ENCOUNTER — Ambulatory Visit
Admission: RE | Admit: 2017-10-07 | Discharge: 2017-10-07 | Disposition: A | Discharge status: Home / Self Care | Payer: Medicare Other | Source: Ambulatory Visit | Attending: Radiation Oncology | Admitting: Radiation Oncology

## 2017-10-07 DIAGNOSIS — C50411 Malignant neoplasm of upper-outer quadrant of right female breast: Secondary | ICD-10-CM | POA: Diagnosis not present

## 2017-10-10 ENCOUNTER — Ambulatory Visit
Admission: RE | Admit: 2017-10-10 | Discharge: 2017-10-10 | Disposition: A | Discharge status: Home / Self Care | Payer: Medicare Other | Source: Ambulatory Visit | Attending: Radiation Oncology | Admitting: Radiation Oncology

## 2017-10-10 DIAGNOSIS — C50411 Malignant neoplasm of upper-outer quadrant of right female breast: Secondary | ICD-10-CM | POA: Diagnosis not present

## 2017-10-11 ENCOUNTER — Ambulatory Visit
Admission: RE | Admit: 2017-10-11 | Discharge: 2017-10-11 | Disposition: A | Discharge status: Home / Self Care | Payer: Medicare Other | Source: Ambulatory Visit | Attending: Radiation Oncology | Admitting: Radiation Oncology

## 2017-10-11 DIAGNOSIS — C50411 Malignant neoplasm of upper-outer quadrant of right female breast: Secondary | ICD-10-CM | POA: Diagnosis not present

## 2017-10-12 ENCOUNTER — Ambulatory Visit
Admission: RE | Admit: 2017-10-12 | Discharge: 2017-10-12 | Disposition: A | Discharge status: Home / Self Care | Payer: Medicare Other | Source: Ambulatory Visit | Attending: Radiation Oncology | Admitting: Radiation Oncology

## 2017-10-12 DIAGNOSIS — C50411 Malignant neoplasm of upper-outer quadrant of right female breast: Secondary | ICD-10-CM

## 2017-10-12 DIAGNOSIS — Z170 Estrogen receptor positive status [ER+]: Principal | ICD-10-CM

## 2017-10-13 ENCOUNTER — Ambulatory Visit
Admission: RE | Admit: 2017-10-13 | Discharge: 2017-10-13 | Disposition: A | Discharge status: Home / Self Care | Payer: Medicare Other | Source: Ambulatory Visit | Attending: Radiation Oncology | Admitting: Radiation Oncology

## 2017-10-13 DIAGNOSIS — C50411 Malignant neoplasm of upper-outer quadrant of right female breast: Secondary | ICD-10-CM | POA: Diagnosis not present

## 2017-10-14 ENCOUNTER — Other Ambulatory Visit: Payer: Medicare Other

## 2017-10-14 ENCOUNTER — Ambulatory Visit
Admission: RE | Admit: 2017-10-14 | Discharge: 2017-10-14 | Disposition: A | Discharge status: Home / Self Care | Payer: Medicare Other | Source: Ambulatory Visit | Attending: Radiation Oncology | Admitting: Radiation Oncology

## 2017-10-14 ENCOUNTER — Ambulatory Visit: Payer: Medicare Other | Admitting: Radiation Oncology

## 2017-10-14 DIAGNOSIS — C50411 Malignant neoplasm of upper-outer quadrant of right female breast: Secondary | ICD-10-CM | POA: Diagnosis not present

## 2017-10-17 ENCOUNTER — Ambulatory Visit
Admission: RE | Admit: 2017-10-17 | Discharge: 2017-10-17 | Disposition: A | Discharge status: Home / Self Care | Payer: Medicare Other | Source: Ambulatory Visit | Attending: Radiation Oncology | Admitting: Radiation Oncology

## 2017-10-17 DIAGNOSIS — C50411 Malignant neoplasm of upper-outer quadrant of right female breast: Secondary | ICD-10-CM | POA: Diagnosis not present

## 2017-10-18 ENCOUNTER — Ambulatory Visit
Admission: RE | Admit: 2017-10-18 | Discharge: 2017-10-18 | Disposition: A | Discharge status: Home / Self Care | Payer: Medicare Other | Source: Ambulatory Visit | Attending: Radiation Oncology | Admitting: Radiation Oncology

## 2017-10-18 DIAGNOSIS — C50411 Malignant neoplasm of upper-outer quadrant of right female breast: Secondary | ICD-10-CM | POA: Diagnosis not present

## 2017-10-19 ENCOUNTER — Ambulatory Visit
Admission: RE | Admit: 2017-10-19 | Discharge: 2017-10-19 | Disposition: A | Discharge status: Home / Self Care | Payer: Medicare Other | Source: Ambulatory Visit | Attending: Radiation Oncology | Admitting: Radiation Oncology

## 2017-10-19 DIAGNOSIS — C50411 Malignant neoplasm of upper-outer quadrant of right female breast: Secondary | ICD-10-CM | POA: Diagnosis not present

## 2017-10-20 ENCOUNTER — Ambulatory Visit
Admission: RE | Admit: 2017-10-20 | Discharge: 2017-10-20 | Disposition: A | Discharge status: Home / Self Care | Payer: Medicare Other | Source: Ambulatory Visit | Attending: Radiation Oncology | Admitting: Radiation Oncology

## 2017-10-20 DIAGNOSIS — C50411 Malignant neoplasm of upper-outer quadrant of right female breast: Secondary | ICD-10-CM | POA: Diagnosis not present

## 2017-10-21 ENCOUNTER — Ambulatory Visit
Admission: RE | Admit: 2017-10-21 | Discharge: 2017-10-21 | Disposition: A | Discharge status: Home / Self Care | Payer: Medicare Other | Source: Ambulatory Visit | Attending: Radiation Oncology | Admitting: Radiation Oncology

## 2017-10-21 DIAGNOSIS — C50411 Malignant neoplasm of upper-outer quadrant of right female breast: Secondary | ICD-10-CM | POA: Diagnosis not present

## 2017-10-24 ENCOUNTER — Ambulatory Visit
Admission: RE | Admit: 2017-10-24 | Discharge: 2017-10-24 | Disposition: A | Discharge status: Home / Self Care | Payer: Medicare Other | Source: Ambulatory Visit | Attending: Radiation Oncology | Admitting: Radiation Oncology

## 2017-10-24 DIAGNOSIS — C50411 Malignant neoplasm of upper-outer quadrant of right female breast: Secondary | ICD-10-CM | POA: Diagnosis not present

## 2017-10-26 NOTE — Progress Notes (Signed)
[place]  Telephone:(336) [phone] Fax:(336) [phone]     ID: Melissa [name] DOB: 1950/01/29  MR#: [id number]  BWI#:[id number]  Patient Care Team: [name] as PCP - General (Family Medicine) End, Melissa Gave, MD as PCP - Cardiology (Cardiology) Melissa Luna, MD as Consulting Physician (General Surgery) Melissa [name], Melissa Dad, MD as Consulting Physician (Oncology) Melissa Rudd, MD as Consulting Physician (Radiation Oncology) Melissa Sacramento, MD as Consulting Physician (Family Medicine) Melissa Halim., PA-C as Physician Assistant (Family Medicine) Melissa Campbell, MD as Consulting Physician (Gastroenterology) OTHER MD:    CHIEF COMPLAINT: Estrogen receptor positive breast cancer  CURRENT TREATMENT: Anastrozole (to start 11/25/2017)   HISTORY OF CURRENT ILLNESS: From the original intake note:  Melissa [name] had routine screening bilateral mammography with tomography at [name] on 07/12/2017 showing; breast density category C. There was a possible mass in the right breast. On 07/14/2017 she completed unilateral right ultrasonography at [name] that showed an irregular hypoechoic 1.6 cm mass in the right upper outer quadrant and posterior depth. Evaluation of the right axilla showed 4 lymph nodes with cortical thickening suspicious of malignancy.   Accordingly on 07/19/2017 she proceeded to biopsy of the right breast area and 1 lymph node in question. 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  The lymph node was biopsied showing no evidence of carcinoma, which was found to be concordant.  Prognostic indicators significant for: estrogen receptor, 95% positive and progesterone receptor, 95% positive, both with strong staining intensity. Proliferation marker Ki67 at 10% . HER2 not amplified with ratios HER2/CEP17 signals 1.22 and average HER2 copies per  cell 1.95  The patient's subsequent history is as detailed below.  INTERVAL HISTORY: Melissa [name] returns today for follow up and treatment of her estrogen receptor positive breast cancer. Since her last visit, she underwent right lumpectomy with sentinel lymph node sampling on 08/23/2017 with pathology showing ([id number]): Invasive ductal carcinoma, grade II spanning 1.7 cm. Low grade ductal carcinoma in situ. Resection margins are negative. Five right axillary sentinel lymph nodes were negative for carcinoma. (0/5).   She also completed radiation treatments on 10/24/2017. She tolerated the treatments well. She did not have a lot of fatigue. She has some itching and skin discoloration. She denies skin peeling.    REVIEW OF SYSTEMS: Melissa [name] experienced some pain with her lumpectomy. She is still sore and has occasional shooting pains in the right breast. She denies unusual headaches, visual changes, nausea, vomiting, or dizziness. There has been no unusual cough, phlegm production, or pleurisy. There has been no change in bowel or bladder habits. She denies unexplained fatigue or unexplained weight loss, bleeding, rash, or fever. A detailed review of systems was otherwise stable.    PAST MEDICAL HISTORY: Past Medical History:  Diagnosis Date  . Anxiety   . Arthritis    shoulders, hands  . Cancer ([place]) 07/2017   right breast cancer  . Chest pain   . Diverticulosis   . GERD (gastroesophageal reflux disease)   . High cholesterol   . IBS (irritable bowel syndrome)   . Kidney disorder    Pt states only has left kidney  . Panic attacks   . Pulmonary embolism ([place]) 2018   pt states resolved and no longer takes blood thinners  Small pulmonary emboli - eliquis for 6 months- resolved.  Depressive and anxiety moods - xanax One functioning Kidney  PAST SURGICAL HISTORY: Past Surgical History:  Procedure Laterality Date  . BREAST LUMPECTOMY WITH RADIOACTIVE SEED AND SENTINEL LYMPH NODE BIOPSY  Right 08/23/2017   Procedure: BREAST LUMPECTOMY WITH RADIOACTIVE SEED AND SENTINEL LYMPH NODE BIOPSY;  Surgeon: Melissa Luna, MD;  Location: [name];  Service: General;  Laterality: Right;  . TUBAL LIGATION      FAMILY HISTORY Family History  Problem Relation Age of Onset  . Alzheimer's disease Mother   . Stroke Father 36  . Heart attack Maternal Grandmother   . Heart attack Paternal Grandmother   . Cervical cancer Cousin   . Colon cancer Paternal Aunt   . Colon cancer Cousin   The patient's father died at age 87 due to CHF. The patient's mother died at age 34 due to Alzheimer's. The patient has 1 brother and 4 sisters. There was a paternal 1st cousin diagnosed with cervical cancer at age 54. There was a paternal aunt diagnosed with colon cancer at age 23. There was a paternal 1st cousin diagnosed with colon cancer at age 67. The patient denies a family history of breast or ovarian cancer.    GYNECOLOGIC HISTORY:  No LMP recorded. Patient is postmenopausal. Menarche: 67 years old Age at first live birth: 67 years old She is GX P1. Her LMP was in 2008. She used oral contraception with no complications. She did not used HRT.    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 Her husband, Melissa [name], died in 12/13/2016 due to pancreatic cancer. He was a Furniture conservator/restorer.  The past year has been accordingly very difficult for the patient.  The patient's daughter, Melissa [name] lives in [place] and works at [employer].      ADVANCED DIRECTIVES: Not in place. The patient plans to name her daughter, Melissa [name] as her HCPOA. The appropriate forms were given at the 07/27/2017 visit.    HEALTH MAINTENANCE: Social History   Tobacco Use  . Smoking status: Former Smoker    Packs/day: 20.00    Years: 1.00    Pack years: 20.00    Types: Cigarettes    Last attempt to quit: 1985    Years since quitting: 34.7  . Smokeless tobacco: Never Used  Substance Use  Topics  . Alcohol use: Yes    Frequency: Never    Comment: social  . Drug use: Never     Colonoscopy: Feb. 2019/ [facility]/ diverticulosis  PAP: Nov. 2018  Bone density: Nov. 2018 / osteopenia   Allergies  Allergen Reactions  . Clindamycin Diarrhea and Nausea Only    Current Outpatient Medications  Medication Sig Dispense Refill  . acetaminophen (TYLENOL) 500 MG tablet Take 1,000 mg by mouth every 6 (six) hours as needed for mild pain.    Marland Kitchen ALPRAZolam (XANAX) 0.25 MG tablet 3 (three) times daily. TAKE 1/2 TABLET IN THE MORNING AND 1/2 IN THE EVENING AT BEDTIME    . calcium carbonate (TUMS - DOSED IN MG ELEMENTAL CALCIUM) 500 MG chewable tablet Chew 1 tablet by mouth 2 (two) times daily. 600 mg twice a day    . hydrocortisone cream 1 % Apply 1 application topically as needed for itching.    . hyoscyamine (ANASPAZ) 0.125 MG TBDP disintergrating tablet Take 0.125 mg by mouth every 6 (six) hours as needed.  3  . omeprazole (PRILOSEC) 20 MG capsule Take 20 mg by mouth 2 (two) times daily before a meal.    . oxyCODONE (OXY IR/ROXICODONE) 5 MG immediate release tablet Take 1 tablet (5  mg total) by mouth every 6 (six) hours as needed for severe pain. (Patient not taking: Reported on 09/13/2017) 10 tablet 0  . rosuvastatin (CRESTOR) 5 MG tablet Take 5 mg 1 tablet on Monday-Wednesday-Friday 90 tablet 3   No current facility-administered medications for this visit.     OBJECTIVE: Middle-aged white woman who appears stated age  105:   10/27/17 1124  BP: 134/63  Pulse: 77  Resp: 18  Temp: (!) 97.4 F (36.3 C)  SpO2: 99%     Body mass index is 29.49 kg/m.   Wt Readings from Last 3 Encounters:  10/27/17 171 lb 12.8 oz (77.9 kg)  09/13/17 172 lb 6.4 oz (78.2 kg)  08/23/17 172 lb (78 kg)      ECOG FS:0 - Asymptomatic  Ocular: Sclerae unicteric, pupils round and equal Ear-nose-throat: Oropharynx clear and moist Lymphatic: No cervical or supraclavicular adenopathy Lungs no rales or  rhonchi Heart regular rate and rhythm Abd soft, nontender, positive bowel sounds MSK no focal spinal tenderness, no joint edema Neuro: non-focal, well-oriented, somewhat flat and depressed affect but overall appropriatet Breasts: The right breast is status post recent biopsy.  There are no skin or nipple changes of concern.  The left breast is benign.  Both axillae are benign.   LAB RESULTS:  CMP     Component Value Date/Time   NA 140 07/27/2017 0839   K 4.0 07/27/2017 0839   CL 104 07/27/2017 0839   CO2 28 07/27/2017 0839   GLUCOSE 105 (H) 07/27/2017 0839   BUN 17 07/27/2017 0839   CREATININE 0.84 07/27/2017 0839   CALCIUM 10.1 07/27/2017 0839   PROT 7.4 07/27/2017 0839   ALBUMIN 4.2 07/27/2017 0839   AST 18 07/27/2017 0839   ALT 19 07/27/2017 0839   ALKPHOS 70 07/27/2017 0839   BILITOT 0.5 07/27/2017 0839   GFRNONAA >60 07/27/2017 0839   GFRAA >60 07/27/2017 0839    No results found for: TOTALPROTELP, ALBUMINELP, A1GS, A2GS, BETS, BETA2SER, GAMS, MSPIKE, SPEI  No results found for: KPAFRELGTCHN, LAMBDASER, [name]  Lab Results  Component Value Date   WBC 7.5 07/27/2017   NEUTROABS 4.3 07/27/2017   HGB 13.5 07/27/2017   HCT 40.0 07/27/2017   MCV 92.8 07/27/2017   PLT 281 07/27/2017    _0 @  No results found for: LABCA2  No components found for: YSAYTK160  No results for input(s): INR in the last 168 hours.  No results found for: LABCA2  No results found for: FUX323  No results found for: FTD322  No results found for: GUR427  No results found for: CA2729  No components found for: HGQUANT  No results found for: CEA1 / No results found for: CEA1   No results found for: AFPTUMOR  No results found for: CHROMOGRNA  No results found for: PSA1  No visits with results within 3 Day(s) from this visit.  Latest known visit with results is:  Appointment on 07/27/2017  Component Date Value Ref Range Status  . Sodium 07/27/2017 140  135 -  145 mmol/L Final   Please note reference intervals were recently updated.  . Potassium 07/27/2017 4.0  3.5 - 5.1 mmol/L Final  . Chloride 07/27/2017 104  98 - 111 mmol/L Final  . CO2 07/27/2017 28  22 - 32 mmol/L Final  . Glucose, Bld 07/27/2017 105* 70 - 99 mg/dL Final  . BUN 07/27/2017 17  8 - 23 mg/dL Final   Please note change in reference range.  . Creatinine  07/27/2017 0.84  0.44 - 1.00 mg/dL Final  . Calcium 07/27/2017 10.1  8.9 - 10.3 mg/dL Final  . Total Protein 07/27/2017 7.4  6.5 - 8.1 g/dL Final  . Albumin 07/27/2017 4.2  3.5 - 5.0 g/dL Final  . AST 07/27/2017 18  15 - 41 U/L Final  . ALT 07/27/2017 19  0 - 44 U/L Final  . Alkaline Phosphatase 07/27/2017 70  38 - 126 U/L Final  . Total Bilirubin 07/27/2017 0.5  0.3 - 1.2 mg/dL Final  . GFR, Est Non Af Am 07/27/2017 >60  >60 mL/min Final  . GFR, Est AFR Am 07/27/2017 >60  >60 mL/min Final   Comment: (NOTE) The eGFR has been calculated using the CKD EPI equation. This calculation has not been validated in all clinical situations. eGFR's persistently <60 mL/min signify possible Chronic Kidney Disease.   Georgiann Hahn gap 07/27/2017 8  5 - 15 Final   Performed at [name], [place] [address] [place], [place] [address]  . WBC Count 07/27/2017 7.5  3.9 - 10.3 K/uL Final  . RBC 07/27/2017 4.31  3.70 - 5.45 MIL/uL Final  . Hemoglobin 07/27/2017 13.5  11.6 - 15.9 g/dL Final  . HCT 07/27/2017 40.0  34.8 - 46.6 % Final  . MCV 07/27/2017 92.8  79.5 - 101.0 fL Final  . MCH 07/27/2017 31.3  25.1 - 34.0 pg Final  . MCHC 07/27/2017 33.8  31.5 - 36.0 g/dL Final  . RDW 07/27/2017 13.0  11.2 - 14.5 % Final  . Platelet Count 07/27/2017 281  145 - 400 K/uL Final  . Neutrophils Relative % 07/27/2017 57  % Final  . Neutro Abs 07/27/2017 4.3  1.5 - 6.5 K/uL Final  . Lymphocytes Relative 07/27/2017 32  % Final  . Lymphs Abs 07/27/2017 2.4  0.9 - 3.3 K/uL Final  . Monocytes Relative 07/27/2017 9  % Final  . Monocytes  Absolute 07/27/2017 0.7  0.1 - 0.9 K/uL Final  . Eosinophils Relative 07/27/2017 1  % Final  . Eosinophils Absolute 07/27/2017 0.1  0.0 - 0.5 K/uL Final  . Basophils Relative 07/27/2017 1  % Final  . Basophils Absolute 07/27/2017 0.0  0.0 - 0.1 K/uL Final   Performed at [facility], [name] [address] [place], [place] [address]    (this displays the last labs from the last 3 days)  No results found for: TOTALPROTELP, ALBUMINELP, A1GS, A2GS, BETS, BETA2SER, GAMS, MSPIKE, SPEI (this displays SPEP labs)  No results found for: KPAFRELGTCHN, LAMBDASER, KAPLAMBRATIO (kappa/lambda light chains)  No results found for: HGBA, HGBA2QUANT, HGBFQUANT, HGBSQUAN (Hemoglobinopathy evaluation)   No results found for: LDH  No results found for: IRON, TIBC, IRONPCTSAT (Iron and TIBC)  No results found for: FERRITIN  Urinalysis    Component Value Date/Time   COLORURINE STRAW (A) 03/26/2017 [place] 03/26/2017 0724   LABSPEC 1.012 03/26/2017 0724   PHURINE 7.0 03/26/2017 0724   GLUCOSEU NEGATIVE 03/26/2017 0724   HGBUR SMALL (A) 03/26/2017 0724   BILIRUBINUR NEGATIVE 03/26/2017 0724   KETONESUR NEGATIVE 03/26/2017 0724   PROTEINUR NEGATIVE 03/26/2017 0724   NITRITE NEGATIVE 03/26/2017 0724   LEUKOCYTESUR NEGATIVE 03/26/2017 0724     STUDIES: No results found.   ELIGIBLE FOR AVAILABLE RESEARCH PROTOCOL: no  ASSESSMENT: 67 y.o. [place], [place] woman status post right breast upper outer quadrant biopsy 07/19/2017 for a clinical T1c NX, stage IA invasive ductal carcinoma, grade 2, estrogen and progesterone receptor  positive, HER-2 not amplified, with an MIB-1 of 10%  (a) 1 of 4 suspicious axillary lymph nodes biopsied 07/19/2017 was negative  (1) status post right lumpectomy and sentinel lymph node sampling 08/23/2017 for a pT1c pN0, stage IA invasive ductal carcinoma, grade 2, with negative margins  (a) total of 5 sentinel lymph nodes removed  (2)  The Oncotype DX score was 12, predicting a risk of outside the breast recurrence over the next 9 years of 3% if the patient's only systemic therapy is tamoxifen for 5 years.  It also predicts no significant benefit from chemotherapy.  (3) adjuvant radiation completed 10/24/2017  (4) antiestrogens to follow at the completion of local treatment  (5) history of acute right lower lobe pulmonary embolus 03/19/2016; bilateral Dopplers lower extremity same day were clear  (a) status post apixaban x6 months  PLAN: [name] completed her radiation treatments last week and generally did quite well with them.  She is now done with local therapy for her breast cancer and is ready to consider systemic treatment.  She understands that her breast cancer was present for years before it could be found on mammography.  During that time there was the possibility of metastatic spread, which would be occult since her tumor is stage I.  Staging studies and stage I tumors are generally negative.  Of course no staging study and no lab work can find microscopic spread of disease outside the breast which is of course are concerned  The 2 options for systemic therapy are antiestrogens and chemotherapy.  Her Oncotype test told us the benefit of chemotherapy in her case would be negligible.  Accordingly the only systemic therapy she will receive is antiestrogens.  Since she had a pulmonary embolus last year, which may or may not have been provoked (it occurred shortly after GI procedures she tells me) it would be prudent to avoid tamoxifen, which is known to be a procoagulant.  Accordingly we discussed anastrozole in detail.  She has a good understanding of the possible toxicities, side effects and complications of this agent.  I would like her to take a month off to recover more fully from radiation.  Accordingly she will start anastrozole 11/25/2017.  She will return to see me February 2020.  If she is tolerating  anastrozole well at that time the plan will be to continue for a total of 5 years.  Otherwise we will consider a different antiestrogen.  She has a good understanding of the overall plan.  She agrees with it.  She knows her overall prognosis is good.  She will call with any issue that may develop before her next visit.   Yoselin Amerman, Melissa Dad, MD  10/27/17 11:48 AM Medical Oncology and Hematology [name] [address] [place], [place] [address] Tel. [phone]    Fax. [phone]  Alice Rieger, am acting as scribe for Chauncey Cruel MD.  I, Lurline Del MD, have reviewed the above documentation for accuracy and completeness, and I agree with the above.

## 2017-10-27 ENCOUNTER — Inpatient Hospital Stay: Payer: Medicare Other | Attending: Oncology | Admitting: Oncology

## 2017-10-27 VITALS — BP 134/63 | HR 77 | Temp 97.4°F | Resp 18 | Ht 64.0 in | Wt 171.8 lb

## 2017-10-27 DIAGNOSIS — Z79899 Other long term (current) drug therapy: Secondary | ICD-10-CM

## 2017-10-27 DIAGNOSIS — Z87891 Personal history of nicotine dependence: Secondary | ICD-10-CM | POA: Diagnosis not present

## 2017-10-27 DIAGNOSIS — F419 Anxiety disorder, unspecified: Secondary | ICD-10-CM | POA: Diagnosis not present

## 2017-10-27 DIAGNOSIS — C50411 Malignant neoplasm of upper-outer quadrant of right female breast: Secondary | ICD-10-CM

## 2017-10-27 DIAGNOSIS — Z79810 Long term (current) use of selective estrogen receptor modulators (SERMs): Secondary | ICD-10-CM

## 2017-10-27 DIAGNOSIS — I2699 Other pulmonary embolism without acute cor pulmonale: Secondary | ICD-10-CM

## 2017-10-27 DIAGNOSIS — Z86711 Personal history of pulmonary embolism: Secondary | ICD-10-CM | POA: Diagnosis not present

## 2017-10-27 DIAGNOSIS — Z923 Personal history of irradiation: Secondary | ICD-10-CM | POA: Diagnosis not present

## 2017-10-27 DIAGNOSIS — M8580 Other specified disorders of bone density and structure, unspecified site: Secondary | ICD-10-CM

## 2017-10-27 DIAGNOSIS — Z170 Estrogen receptor positive status [ER+]: Secondary | ICD-10-CM

## 2017-10-27 MED ORDER — ANASTROZOLE 1 MG PO TABS: 1.0000 mg | ORAL_TABLET | Freq: Every day | ORAL | 12 refills | Status: DC

## 2017-10-27 NOTE — Progress Notes (Signed)
  Radiation Oncology         [phone] ________________________________  Name: Jennel Mara MRN: [id number]  Date: 10/12/2017  DOB: 1950-10-14  SIMULATION AND TREATMENT PLANNING NOTE  DIAGNOSIS:     ICD-10-CM   1. Malignant neoplasm of upper-outer quadrant of right breast in female, estrogen receptor positive ([place]) C50.411    Z17.0      Site:  Right breast  NARRATIVE:  The patient was brought to the [place].  Identity was confirmed.  All relevant records and images related to the planned course of therapy were reviewed.   Written consent to proceed with treatment was confirmed which was freely given after reviewing the details related to the planned course of therapy had been reviewed with the patient.  Then, the patient was set-up in a stable reproducible  supine position for radiation therapy.  CT images were obtained.  Surface markings were placed.    Medically necessary complex treatment device(s) for immobilization:  Vac-lock bag.   The CT images were loaded into the planning software.  Then the target and avoidance structures were contoured.  Treatment planning then occurred.  The radiation prescription was entered and confirmed.  A total of 3 complex treatment devices were fabricated which relate to the designed radiation treatment fields. Each of these customized fields/ complex treatment devices will be used on a daily basis during the radiation course. I have requested : Isodose Plan (3D for initial phase of treatment).   PLAN:  The patient will receive 8 Gy in 4 fractions for her boost for a total dose of 50.56 Gy.  ________________________________   Jodelle Gross, MD, PhD

## 2017-10-27 NOTE — Progress Notes (Signed)
  Radiation Oncology         [phone] ________________________________  Name: [name] MRN: [id number]  Date: 10/12/2017  DOB: 09-22-1950  Optical Surface Tracking Plan:  Since intensity modulated radiotherapy (IMRT) and 3D conformal radiation treatment methods are predicated on accurate and precise positioning for treatment, intrafraction motion monitoring is medically necessary to ensure accurate and safe treatment delivery.  The ability to quantify intrafraction motion without excessive ionizing radiation dose can only be performed with optical surface tracking. Accordingly, surface imaging offers the opportunity to obtain 3D measurements of patient position throughout IMRT and 3D treatments without excessive radiation exposure.  I am ordering optical surface tracking for this patient's upcoming course of radiotherapy. ________________________________  Kyung Rudd, MD 10/27/2017 11:09 AM    Reference:   Ursula Alert, J, et al. Surface imaging-based analysis of intrafraction motion for breast radiotherapy patients.Journal of [place], n. 6, nov. 2014. ISSN 40981191.   Available at: <http://www.jacmp.org/index.php/jacmp/article/view/4957>.

## 2017-11-17 ENCOUNTER — Telehealth: Payer: Self-pay | Admitting: Adult Health

## 2017-11-17 NOTE — Telephone Encounter (Signed)
Mailed pt calendar of appts per 10/23 sch message

## 2017-11-23 ENCOUNTER — Other Ambulatory Visit: Payer: Self-pay

## 2017-11-23 ENCOUNTER — Encounter: Payer: Self-pay | Admitting: Radiation Oncology

## 2017-11-23 ENCOUNTER — Ambulatory Visit
Admission: RE | Admit: 2017-11-23 | Discharge: 2017-11-23 | Disposition: A | Discharge status: Home / Self Care | Payer: Medicare Other | Source: Ambulatory Visit | Attending: Radiation Oncology | Admitting: Radiation Oncology

## 2017-11-23 VITALS — BP 145/70 | HR 79 | Temp 97.9°F | Resp 20 | Ht 64.0 in | Wt 173.0 lb

## 2017-11-23 DIAGNOSIS — Z79899 Other long term (current) drug therapy: Secondary | ICD-10-CM | POA: Insufficient documentation

## 2017-11-23 DIAGNOSIS — C50911 Malignant neoplasm of unspecified site of right female breast: Secondary | ICD-10-CM | POA: Insufficient documentation

## 2017-11-23 DIAGNOSIS — Z881 Allergy status to other antibiotic agents status: Secondary | ICD-10-CM | POA: Insufficient documentation

## 2017-11-23 DIAGNOSIS — C50411 Malignant neoplasm of upper-outer quadrant of right female breast: Secondary | ICD-10-CM

## 2017-11-23 DIAGNOSIS — R0789 Other chest pain: Secondary | ICD-10-CM | POA: Diagnosis not present

## 2017-11-23 DIAGNOSIS — Z170 Estrogen receptor positive status [ER+]: Secondary | ICD-10-CM | POA: Insufficient documentation

## 2017-11-23 NOTE — Progress Notes (Signed)
Radiation Oncology         [phone] ________________________________  Name: Shawni Volkov MRN: [id number]  Date of Service: 11/23/2017  DOB: 09-May-1950  Post Treatment Note  CC: [facility]  Erroll Luna, MD  Diagnosis:   Stage IA, pT1cN0M0, grade 2 ER/PR positive invasive ductal carcinoma of the right breast  Interval Since Last Radiation:  4 weeks   09/27/17-10/18/17:  50.65 Gy to the right breast over 28 fractions including 8 Gy boost in 4 fractions.  Narrative:  The patient returns today for routine follow-up. During treatment she did very well with radiotherapy and did not have significant desquamation.                             On review of systems, the patient states she's doing okay but is having right shoulder pain and radiating pain around from her shoulder blade to her inframammary fold. She denies any recent trauma, and denies rashes. She has had some mild edema of the chest wall but no RUE edema.  ALLERGIES:  is allergic to clindamycin.  Meds: Current Outpatient Medications  Medication Sig Dispense Refill  . acetaminophen (TYLENOL) 500 MG tablet Take 1,000 mg by mouth every 6 (six) hours as needed for mild pain.    Marland Kitchen ALPRAZolam (XANAX) 0.25 MG tablet 3 (three) times daily. TAKE 1/2 TABLET IN THE MORNING AND 1/2 IN THE EVENING AT BEDTIME    . cholecalciferol 2000 units tablet Take 0.5 tablets (1,000 Units total) by mouth daily.    Marland Kitchen omeprazole (PRILOSEC) 20 MG capsule Take 20 mg by mouth 2 (two) times daily before a meal.    . anastrozole (ARIMIDEX) 1 MG tablet Take 1 tablet (1 mg total) by mouth daily. (Patient not taking: Reported on 11/23/2017) 90 tablet 12  . hydrocortisone cream 1 % Apply 1 application topically as needed for itching.    . hyoscyamine (ANASPAZ) 0.125 MG TBDP disintergrating tablet Take 0.125 mg by mouth every 6 (six) hours as needed.  3  . rosuvastatin (CRESTOR) 5 MG tablet Take 5 mg 1 tablet on  Monday-Wednesday-Friday (Patient not taking: Reported on 11/23/2017) 90 tablet 3   No current facility-administered medications for this encounter.     Physical Findings:  height is 5\' 4"  (1.626 m) and weight is 173 lb (78.5 kg). Her oral temperature is 97.9 F (36.6 C). Her blood pressure is 145/70 (abnormal) and her pulse is 79. Her respiration is 20 and oxygen saturation is 98%.  Pain Assessment Pain Score: 2  Pain Loc: Breast(Right breast and right axilla)/10 In general this is a well appearing caucasian female in no acute distress. She's alert and oriented x4 and appropriate throughout the examination. Cardiopulmonary assessment is negative for acute distress and she exhibits normal effort. The right breast was examined and reveals mild hyperpigmentation. No desquamation is noted. She has edema of the right chest wall that is mild and no rashes or lesion. She does have some mild congestion of the right breast.   Lab Findings: Lab Results  Component Value Date   WBC 7.5 07/27/2017   HGB 13.5 07/27/2017   HCT 40.0 07/27/2017   MCV 92.8 07/27/2017   PLT 281 07/27/2017     Radiographic Findings: No results found.  Impression/Plan: 1. 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 The patient has been doing well since completion of radiotherapy. We discussed that  we would be happy to continue to follow her as needed, but she will also continue to follow up with Dr. Jana Hakim in medical oncology. She was counseled on skin care as well as measures to avoid sun exposure to this area.  2. Survivorship. We discussed the importance of survivorship evaluation and she is  currently scheduled for this in the near future. She was also given the monthly calendar for access to resources offered within the cancer center. 3. Right chest wall and fullness and right shoulder and chest wall pain. We will refer her to PT for eval and treatment as she may have  some mild lymphatic congestion as well as arthritis.      Carola Rhine, PAC

## 2017-11-23 NOTE — Addendum Note (Signed)
Encounter addended by: Malena Edman, RN on: 11/23/2017 1:58 PM  Actions taken: Charge Capture section accepted

## 2017-11-24 ENCOUNTER — Ambulatory Visit: Payer: Medicare Other | Attending: Surgery | Admitting: Physical Therapy

## 2017-11-24 ENCOUNTER — Encounter: Payer: Self-pay | Admitting: Radiation Oncology

## 2017-11-24 ENCOUNTER — Encounter: Payer: Self-pay | Admitting: Physical Therapy

## 2017-11-24 ENCOUNTER — Other Ambulatory Visit: Payer: Self-pay

## 2017-11-24 DIAGNOSIS — Z483 Aftercare following surgery for neoplasm: Secondary | ICD-10-CM | POA: Diagnosis present

## 2017-11-24 DIAGNOSIS — R293 Abnormal posture: Secondary | ICD-10-CM | POA: Diagnosis present

## 2017-11-24 DIAGNOSIS — M6281 Muscle weakness (generalized): Secondary | ICD-10-CM | POA: Diagnosis present

## 2017-11-24 DIAGNOSIS — I890 Lymphedema, not elsewhere classified: Secondary | ICD-10-CM | POA: Insufficient documentation

## 2017-11-24 NOTE — Patient Instructions (Signed)
First of all, check with your insurance company to see if provider is in network    [facility] (for wigs and compression sleeves / gloves/gauntlets )  [address] [place], [place] [address] [phone]  Will file some insurances --- call for appointment   [facility] (for mastectomy prosthetics and garments) [address] [place], [place] [address] [phone] Will file some insurances --- call for appointment  [facility]  [address]  [place], [place] [address] [phone] Lower extremity garments  [facility] [address] [place], [place]  [address] [phone]  Cathy Rubel ( Medicaid certified lymphedema fitter) [phone] [email or web address]  Melissa Meares  [facility]  [phone]  [facility] [address] [place], [place] [address] [phone]  Other Resources: National Lymphedema Network:  www.lymphnet.org www.Klosetraining.com for patient articles and self manual lymph drainage information www.lymphedemablog.com has informative articles.  www.compressionguru.com www.lymphedemaproducts.com www.brightlifedirect.com www.compressionguru.com 

## 2017-11-24 NOTE — Progress Notes (Signed)
  Radiation Oncology         [phone] ________________________________  Name: Melissa [name] MRN: [id number]  Date: 11/24/2017  DOB: Mar 20, 1950  End of Treatment Note  Diagnosis:   right-sided breast cancer     Indication for treatment:  Curative       Radiation treatment dates:   09/27/2017 - 10/24/2017  Site/dose:   The patient initially received a dose of 42.56 Gy in 16 fractions to the breast using whole-breast tangent fields. This was delivered using a 3-D conformal technique. The patient then received a boost to the seroma. This delivered an additional 8 Gy in 4 fractions using a 3 field photon technique due to the depth of the seroma. The total dose was 50.56 Gy.  Narrative: The patient tolerated radiation treatment relatively well.   The patient had some expected skin irritation as she progressed during treatment. Moist desquamation was not present at the end of treatment. She experienced mild to moderate erythema and itching.  Plan: The patient has completed radiation treatment. The patient will return to radiation oncology clinic for routine followup in one month. I advised the patient to call or return sooner if they have any questions or concerns related to their recovery or treatment. ________________________________  Jodelle Gross, M.D., Ph.D.  This document serves as a record of services personally performed by Kyung Rudd, MD. It was created on his behalf by Wilburn Mylar, a trained medical scribe. The creation of this record is based on the scribe's personal observations and the provider's statements to them. This document has been checked and approved by the attending provider.

## 2017-11-24 NOTE — Therapy (Signed)
[place], [place], [address] Phone: [phone]   Fax:  [phone]  Physical Therapy Evaluation  Patient Details  Name: Melissa [name] MRN: [id number] Date of Birth: 11-Feb-1950 Referring Provider (PT): Shona Simpson   Encounter Date: 11/24/2017  PT End of Session - 11/24/17 2121    Visit Number  1    Number of Visits  9    Date for PT Re-Evaluation  12/24/17    PT Start Time  4967    PT Stop Time  1600    PT Time Calculation (min)  45 min    Activity Tolerance  Patient tolerated treatment well    Behavior During Therapy  [facility] for tasks assessed/performed       Past Medical History:  Diagnosis Date  . Anxiety   . Arthritis    shoulders, hands  . Cancer ([place]) 07/2017   right breast cancer  . Chest pain   . Diverticulosis   . GERD (gastroesophageal reflux disease)   . High cholesterol   . IBS (irritable bowel syndrome)   . Kidney disorder    Pt states only has left kidney  . Panic attacks   . Pulmonary embolism ([place]) 2018   pt states resolved and no longer takes blood thinners    Past Surgical History:  Procedure Laterality Date  . BREAST LUMPECTOMY WITH RADIOACTIVE SEED AND SENTINEL LYMPH NODE BIOPSY Right 08/23/2017   Procedure: BREAST LUMPECTOMY WITH RADIOACTIVE SEED AND SENTINEL LYMPH NODE BIOPSY;  Surgeon: Erroll Luna, MD;  Location: [place];  Service: General;  Laterality: Right;  . TUBAL LIGATION      There were no vitals filed for this visit.   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 Patient underwent a right lumpectomy and sentinel node biopsy (0/5 nodes positive) on 08/23/17.  Radiation was completed  10/18/2017. She was hospitalized in 01/2017 with diverticulitis and also had a pulmonary embolus. She recently lost her husband in 10/2016 due to pancreatic cancer. She reports a history of right shoulder and spine arthritis and hip pain that she did not get taken care of due to taking care of her husband and then being diagnosed herself.     Patient Stated Goals  to get rid of the pain and fullness in her right breast     Currently in Pain?  Yes    Pain Score  2     Pain Location  Breast    Pain Orientation  Right    Pain Descriptors / Indicators  Aching;Tender    Pain Type  Acute pain    Pain Radiating Towards  toward back     Pain Onset  1 to 4 weeks ago    Pain Frequency  Intermittent    Aggravating Factors   worse with bra on          [facility] PT Assessment - 11/24/17 0001      Assessment   Medical Diagnosis  s/p right lumpectomy and SLNB    Referring Provider (PT)  Shona Simpson    Onset Date/Surgical Date  08/23/17    Hand Dominance  Right    Prior Therapy  Baselines  Precautions   Precautions  Other (comment)    Precaution Comments  Right arm lymphedema risk      Restrictions   Weight Bearing Restrictions  No      Balance Screen   Has the patient fallen in the past 6 months  No    Has the patient had a decrease in activity level because of a fear of falling?   No    Is the patient reluctant to leave their home because of a fear of falling?   No      Home Environment   Living Environment  Private residence    Living Arrangements  Alone    Available Help at Discharge  Family      Prior Function   Level of [place]  Retired    Leisure  She has been walking 30 minutes 2x/week      Cognition   Overall Cognitive Status  Within Functional Limits for tasks assessed      Observation/Other Assessments   Observations  darkened areas at upper lateral breast near incision. Pt has mild visible fullness at right lateral chest        Posture/Postural Control   Posture/Postural Control  Postural limitations    Postural Limitations  Rounded Shoulders;Forward head      AROM   Right Shoulder Extension  --    Right Shoulder Flexion  165 Degrees    Right Shoulder ABduction  165 Degrees    Right Shoulder Internal Rotation  --    Right Shoulder External Rotation  --      Strength   Overall Strength  Within functional limits for tasks performed      Palpation   Palpation comment  palpable fullness at right lateral chest and axilla with tenderness to touch,  Pt also feels fullness at inferior breast  She has tight tenderness in right upper trap                 Objective measurements completed on examination: See above findings.      [facility] Adult PT Treatment/Exercise - 11/24/17 0001      Self-Care   Self-Care  Other Self-Care Comments    Other Self-Care Comments   showed pt samples of compression bras and gave information about [facility] [employer] [facility]                  PT Long Term Goals - 11/24/17 2128      PT LONG TERM GOAL #1   Title  Pt will report that the pain in her breast is decreased to 1/10 and has decreased in frequency also.    Baseline  2/10    Time  4    Period  Weeks    Status  New      PT LONG TERM GOAL #2   Title  Pt will be independent in self manual lymph draianage and use of compression to manage right breast fullness    Time  4    Period  Weeks    Status  New      PT LONG TERM GOAL #3   Title  Pt will be independent in home exercise program to increase strength and posture    Time  4    Period  Weeks    Status  New             Plan - 11/24/17 2122    Clinical Impression Statement  Pt  is experiencing mild right breast and lateral trunk edema after completion of radiation treatment.  After receiving information about complete decongestive therapy, she wants to proceed with MLD, use of compression bra and instruction in the Strength ABC program.  She would also  benefit from soft tissue work to her upper traps and posture exercise,     History and Personal Factors relevant to plan of care:  History of PE, recent loss of husband, recent surgery adn radiation     Clinical Presentation  Stable    Clinical Decision Making  Low    Rehab Potential  Good    Clinical Impairments Affecting Rehab Potential  radiation completed 10/18/2017    PT Frequency  2x / week    PT Duration  4 weeks    PT Treatment/Interventions  ADLs/Self Care Home Management;Therapeutic exercise;Patient/family education;Therapeutic activities;Manual techniques;Passive range of motion;Scar mobilization;Manual lymph drainage;Taping;Compression bandaging    PT Next Visit Plan  [place] decompression exercises, deep  breathing, MLD and teach to self, soft tissue work to right upper traps, Later, Engineering geologist ABC program     PT Home Exercise Plan  has already attended ABC class    Consulted and Agree with Plan of Care  Patient       Patient will benefit from skilled therapeutic intervention in order to improve the following deficits and impairments:  Decreased skin integrity, Increased fascial restricitons, Pain, Postural dysfunction, Decreased scar mobility, Increased edema  Visit Diagnosis: Lymphedema, not elsewhere classified - Plan: PT plan of care cert/re-cert  Abnormal posture - Plan: PT plan of care cert/re-cert  Aftercare following surgery for neoplasm - Plan: PT plan of care cert/re-cert  Muscle weakness (generalized) - Plan: PT plan of care cert/re-cert     Problem List Patient Active Problem List   Diagnosis Date Noted  . Pulmonary embolism ([place]) 07/27/2017  . Osteopenia 07/27/2017  . Malignant neoplasm of upper-outer quadrant of right breast in female, estrogen receptor positive ([place]) 07/22/2017  . Pure hypercholesterolemia 07/14/2017  . Palpitations 04/18/2017  . Shortness of breath 04/18/2017  . Atypical chest pain 03/26/2017  . Leukocytosis 03/26/2017  . Infected  tooth 03/26/2017  . Panic attacks   . GERD (gastroesophageal reflux disease)   . Diverticulosis   . Anxiety   . Kidney disorder    Donato Heinz. Owens Shark PT  [place] Levo 11/24/2017, 9:33 PM  [place], [place], [address] Phone: [phone]   Fax:  [phone]  Name: Melissa [name] MRN: [id number] Date of Birth: 05/31/1950

## 2017-11-29 ENCOUNTER — Ambulatory Visit: Payer: Medicare Other | Attending: Surgery | Admitting: Physical Therapy

## 2017-11-29 DIAGNOSIS — I890 Lymphedema, not elsewhere classified: Secondary | ICD-10-CM | POA: Diagnosis not present

## 2017-11-29 DIAGNOSIS — Z483 Aftercare following surgery for neoplasm: Secondary | ICD-10-CM | POA: Diagnosis present

## 2017-11-29 DIAGNOSIS — M6281 Muscle weakness (generalized): Secondary | ICD-10-CM

## 2017-11-29 DIAGNOSIS — R293 Abnormal posture: Secondary | ICD-10-CM | POA: Diagnosis present

## 2017-11-29 NOTE — Patient Instructions (Signed)

## 2017-11-29 NOTE — Therapy (Signed)
[place], [place], [address] Phone: [phone]   Fax:  [phone]  Physical Therapy Treatment  Patient Details  Name: Melissa [name] MRN: [id number] Date of Birth: [birth year] Referring Provider (PT): Shona Simpson   Encounter Date: 11/29/2017  PT End of Session - 11/29/17 2044    Visit Number  2    Number of Visits  9    Date for PT Re-Evaluation  12/24/17    PT Start Time  1430    PT Stop Time  1515    PT Time Calculation (min)  45 min    Activity Tolerance  Patient tolerated treatment well    Behavior During Therapy  [facility] for tasks assessed/performed       Past Medical History:  Diagnosis Date  . Anxiety   . Arthritis    shoulders, hands  . Cancer ([place]) 07/2017   right breast cancer  . Chest pain   . Diverticulosis   . GERD (gastroesophageal reflux disease)   . High cholesterol   . IBS (irritable bowel syndrome)   . Kidney disorder    Pt states only has left kidney  . Panic attacks   . Pulmonary embolism ([place]) 2018   pt states resolved and no longer takes blood thinners    Past Surgical History:  Procedure Laterality Date  . BREAST LUMPECTOMY WITH RADIOACTIVE SEED AND SENTINEL LYMPH NODE BIOPSY Right 08/23/2017   Procedure: BREAST LUMPECTOMY WITH RADIOACTIVE SEED AND SENTINEL LYMPH NODE BIOPSY;  Surgeon: Erroll Luna, MD;  Location: [place] [place];  Service: General;  Laterality: Right;  . TUBAL LIGATION      There were no vitals filed for this visit.  Subjective Assessment - 11/29/17 1436    Subjective  Pt reports she is doing well today     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 Patient underwent a right lumpectomy and sentinel node biopsy (0/5 nodes positive) on 08/23/17.  Radiation was completed 10/18/2017. She was hospitalized in 01/2017 with  diverticulitis and also had a pulmonary embolus. She recently lost her husband in 10/2016 due to pancreatic cancer. She reports a history of right shoulder and spine arthritis and hip pain that she did not get taken care of due to taking care of her husband and then being diagnosed herself.     Patient Stated Goals  to get rid of the pain and fullness in her right breast     Currently in Pain?  No/denies                       [facility] Adult PT Treatment/Exercise - 11/29/17 0001      Exercises   Exercises  Shoulder;Lumbar      Lumbar Exercises: Supine   Other Supine Lumbar Exercises  Meeds decompression series 3-5 reps each exercise       Shoulder Exercises: Sidelying   ABduction  AROM;Right;5 reps    ABduction Limitations  pt with pain and stiffness with this motion     Other Sidelying Exercises  small circles with hand pointed to ceiling      Manual Therapy   Manual Therapy  Soft tissue mobilization;Manual Lymphatic Drainage (MLD)    Manual therapy comments  script for compressio bra faxed to Dr. Jana Hakim    Soft tissue mobilization  with thick cream to tight  areas in interscapular area, upper trap and posterior axilla     Manual Lymphatic Drainage (MLD)  In supine, short neck, superficial and deep abdominals. right inguinal nodes and right axillo-inguinal anastamosis, then to left sidelying for posterior interaxillary anastamosis, lateral trunk and axilla                   PT Long Term Goals - 11/24/17 2128      PT LONG TERM GOAL #1   Title  Pt will report that the pain in her breast is decreased to 1/10 and has decreased in frequency also.    Baseline  2/10    Time  4    Period  Weeks    Status  New      PT LONG TERM GOAL #2   Title  Pt will be independent in self manual lymph draianage and use of compression to manage right breast fullness    Time  4    Period  Weeks    Status  New      PT LONG TERM GOAL #3   Title  Pt will be independent in home  exercise program to increase strength and posture    Time  4    Period  Weeks    Status  New            Plan - 11/29/17 2045    Clinical Impression Statement  Pt with fullness in right lateral trunk and trigger points and muscle tightness in back and shoulder area that lessened to a mild degree with initial treatment.  She has pain and stiffness in right shoulder with sidelying exercises    Rehab Potential  Good    Clinical Impairments Affecting Rehab Potential  radiation completed 10/18/2017    PT Frequency  2x / week    PT Duration  4 weeks    PT Next Visit Plan  check to see if script is back , continue  MLD and teach to self, soft tissue work to right upper traps,work on shoulder ROM and stretching  Later, teach Strength ABC program     Consulted and Agree with Plan of Care  Patient       Patient will benefit from skilled therapeutic intervention in order to improve the following deficits and impairments:  Decreased skin integrity, Increased fascial restricitons, Pain, Postural dysfunction, Decreased scar mobility, Increased edema  Visit Diagnosis: Lymphedema, not elsewhere classified  Abnormal posture  Aftercare following surgery for neoplasm  Muscle weakness (generalized)     Problem List Patient Active Problem List   Diagnosis Date Noted  . Pulmonary embolism ([place]) 07/27/2017  . Osteopenia 07/27/2017  . Malignant neoplasm of upper-outer quadrant of right breast in female, estrogen receptor positive ([place]) 07/22/2017  . Pure hypercholesterolemia 07/14/2017  . Palpitations 04/18/2017  . Shortness of breath 04/18/2017  . Atypical chest pain 03/26/2017  . Leukocytosis 03/26/2017  . Infected tooth 03/26/2017  . Panic attacks   . GERD (gastroesophageal reflux disease)   . Diverticulosis   . Anxiety   . Kidney disorder    Donato Heinz. Owens Shark PT   [place] Levo 11/29/2017, 8:48 PM  [place], [place], [address] Phone: [phone]   Fax:  [phone]  Name: Larcenia Holaday MRN: [id number] Date of Birth: [birth year]

## 2017-12-01 ENCOUNTER — Ambulatory Visit: Payer: Medicare Other | Admitting: Physical Therapy

## 2017-12-01 DIAGNOSIS — I890 Lymphedema, not elsewhere classified: Secondary | ICD-10-CM

## 2017-12-01 DIAGNOSIS — M6281 Muscle weakness (generalized): Secondary | ICD-10-CM

## 2017-12-01 DIAGNOSIS — Z483 Aftercare following surgery for neoplasm: Secondary | ICD-10-CM

## 2017-12-01 DIAGNOSIS — R293 Abnormal posture: Secondary | ICD-10-CM

## 2017-12-01 NOTE — Therapy (Signed)
[place], [place], [address] Phone: [phone]   Fax:  [phone]  Physical Therapy Treatment  Patient Details  Name: Melissa [name] MRN: [id number] Date of Birth: Nov 05, 1950 Referring Provider (PT): Shona Simpson   Encounter Date: 12/01/2017  PT End of Session - 12/01/17 1157    Visit Number  3    Number of Visits  9    Date for PT Re-Evaluation  12/24/17    PT Start Time  1105    PT Stop Time  1155    PT Time Calculation (min)  50 min    Activity Tolerance  Patient tolerated treatment well    Behavior During Therapy  [facility] for tasks assessed/performed       Past Medical History:  Diagnosis Date  . Anxiety   . Arthritis    shoulders, hands  . Cancer ([place]) 07/2017   right breast cancer  . Chest pain   . Diverticulosis   . GERD (gastroesophageal reflux disease)   . High cholesterol   . IBS (irritable bowel syndrome)   . Kidney disorder    Pt states only has left kidney  . Panic attacks   . Pulmonary embolism ([place]) 2018   pt states resolved and no longer takes blood thinners    Past Surgical History:  Procedure Laterality Date  . BREAST LUMPECTOMY WITH RADIOACTIVE SEED AND SENTINEL LYMPH NODE BIOPSY Right 08/23/2017   Procedure: BREAST LUMPECTOMY WITH RADIOACTIVE SEED AND SENTINEL LYMPH NODE BIOPSY;  Surgeon: Erroll Luna, MD;  Location: [place];  Service: General;  Laterality: Right;  . TUBAL LIGATION      There were no vitals filed for this visit.  Subjective Assessment - 12/01/17 1109    Subjective  Pt says she had good relief from treatment the other day and she was able to do a lot of activity yesterday .  She is having  a little bit of pain this morning from that acitivtiy     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 Patient underwent a  right lumpectomy and sentinel node biopsy (0/5 nodes positive) on 08/23/17.  Radiation was completed 10/18/2017. She was hospitalized in 01/2017 with diverticulitis and also had a pulmonary embolus. She recently lost her husband in 10/2016 due to pancreatic cancer. She reports a history of right shoulder and spine arthritis and hip pain that she did not get taken care of due to taking care of her husband and then being diagnosed herself.     Patient Stated Goals  to get rid of the pain and fullness in her right breast     Currently in Pain?  Yes    Pain Score  2     Pain Location  Axilla    Pain Orientation  Right    Pain Descriptors / Indicators  Aching                       [facility] Adult PT Treatment/Exercise - 12/01/17 0001      Exercises   Exercises  Neck;Shoulder      Shoulder Exercises: Supine   Protraction Limitations  Attempted protraction but pt had increased pain in right lateral chest       Shoulder Exercises: Sidelying   Other Sidelying Exercises  active scapular mobility       Manual  Therapy   Manual Therapy  Soft tissue mobilization;Manual Lymphatic Drainage (MLD)    Manual therapy comments  issued script     Edema Management  instucted in self manual lymphdrainage to right axilla, lateral chest and breast with use of mirror for visual feedback     Soft tissue mobilization  with thick cream to tight areas in interscapular area, upper trap and posterior axilla     Manual Lymphatic Drainage (MLD)  In supine, short neck, superficial and deep abdominals. right inguinal nodes and right axillo-inguinal anastamosis, then to left sidelying for posterior interaxillary anastamosis, lateral trunk and axilla       Neck Exercises: Stretches   Upper Trapezius Stretch  Right;Left;1 rep    Other Neck Stretches  lymphatic flow yoga series              PT Education - 12/01/17 1156    Education Details  self manual lymph drainage     Person(s) Educated  Patient     Methods  Explanation;Demonstration    Comprehension  Verbalized understanding;Need further instruction          PT Long Term Goals - 11/24/17 2128      PT LONG TERM GOAL #1   Title  Pt will report that the pain in her breast is decreased to 1/10 and has decreased in frequency also.    Baseline  2/10    Time  4    Period  Weeks    Status  New      PT LONG TERM GOAL #2   Title  Pt will be independent in self manual lymph draianage and use of compression to manage right breast fullness    Time  4    Period  Weeks    Status  New      PT LONG TERM GOAL #3   Title  Pt will be independent in home exercise program to increase strength and posture    Time  4    Period  Weeks    Status  New            Plan - 12/01/17 1157    Clinical Impression Statement  Pt had good relief with last treatment but had increase in pain with activity.  She continues with tightnes in lateral chest and posterior axillary area .  She would benefit from incrased thoracic rotation too. Did well with lymphatic flow yoga series     Rehab Potential  Good    Clinical Impairments Affecting Rehab Potential  radiation completed 10/18/2017    PT Frequency  2x / week    PT Duration  4 weeks    PT Next Visit Plan  continue  MLD and reinforce self MLD  soft tissue work to right upper traps,work on shoulder ROM and stretching  Later, Engineering geologist ABC program     PT Home Exercise Plan  has already attended ABC class    Consulted and Agree with Plan of Care  Patient       Patient will benefit from skilled therapeutic intervention in order to improve the following deficits and impairments:  Decreased skin integrity, Increased fascial restricitons, Pain, Postural dysfunction, Decreased scar mobility, Increased edema  Visit Diagnosis: Lymphedema, not elsewhere classified  Abnormal posture  Aftercare following surgery for neoplasm  Muscle weakness (generalized)     Problem List Patient Active Problem  List   Diagnosis Date Noted  . Pulmonary embolism ([place]) 07/27/2017  . Osteopenia 07/27/2017  .  Malignant neoplasm of upper-outer quadrant of right breast in female, estrogen receptor positive ([place]) 07/22/2017  . Pure hypercholesterolemia 07/14/2017  . Palpitations 04/18/2017  . Shortness of breath 04/18/2017  . Atypical chest pain 03/26/2017  . Leukocytosis 03/26/2017  . Infected tooth 03/26/2017  . Panic attacks   . GERD (gastroesophageal reflux disease)   . Diverticulosis   . Anxiety   . Kidney disorder    Donato Heinz. Owens Shark PT  [place] Levo 12/01/2017, 11:59 AM  [place], [place], [address] Phone: [phone]   Fax:  [phone]  Name: Melissa [name] MRN: [id number] Date of Birth: 29-Sep-1950

## 2017-12-07 ENCOUNTER — Encounter: Payer: Self-pay | Admitting: Physical Therapy

## 2017-12-07 ENCOUNTER — Ambulatory Visit: Payer: Medicare Other | Admitting: Physical Therapy

## 2017-12-07 DIAGNOSIS — Z483 Aftercare following surgery for neoplasm: Secondary | ICD-10-CM

## 2017-12-07 DIAGNOSIS — I890 Lymphedema, not elsewhere classified: Secondary | ICD-10-CM

## 2017-12-07 DIAGNOSIS — R293 Abnormal posture: Secondary | ICD-10-CM

## 2017-12-07 DIAGNOSIS — M6281 Muscle weakness (generalized): Secondary | ICD-10-CM

## 2017-12-07 NOTE — Therapy (Signed)
[place], [place], [address] Phone: [phone]   Fax:  [phone]  Physical Therapy Treatment  Patient Details  Name: Melissa [name] MRN: [id number] Date of Birth: Jun 04, 1950 Referring Provider (PT): Shona Simpson   Encounter Date: 12/07/2017  PT End of Session - 12/07/17 1616    Visit Number  4    Number of Visits  9    Date for PT Re-Evaluation  12/24/17    PT Start Time  1430    PT Stop Time  1515    PT Time Calculation (min)  45 min    Activity Tolerance  Patient tolerated treatment well    Behavior During Therapy  [facility] for tasks assessed/performed       Past Medical History:  Diagnosis Date  . Anxiety   . Arthritis    shoulders, hands  . Cancer ([place]) 07/2017   right breast cancer  . Chest pain   . Diverticulosis   . GERD (gastroesophageal reflux disease)   . High cholesterol   . IBS (irritable bowel syndrome)   . Kidney disorder    Pt states only has left kidney  . Panic attacks   . Pulmonary embolism ([place]) 2018   pt states resolved and no longer takes blood thinners    Past Surgical History:  Procedure Laterality Date  . BREAST LUMPECTOMY WITH RADIOACTIVE SEED AND SENTINEL LYMPH NODE BIOPSY Right 08/23/2017   Procedure: BREAST LUMPECTOMY WITH RADIOACTIVE SEED AND SENTINEL LYMPH NODE BIOPSY;  Surgeon: Erroll Luna, MD;  Location: [facility];  Service: General;  Laterality: Right;  . TUBAL LIGATION      There were no vitals filed for this visit.  Subjective Assessment - 12/07/17 1608    Subjective  Pt went to [facility] [place] and got two types of Prarie compression bra.  She cannot tolerate the "Hugger" as the compression is too painful on her breasts.  She still feels that her breasts are painful when they get bumped.    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 Patient underwent a right lumpectomy and sentinel node biopsy (0/5 nodes positive) on 08/23/17.  Radiation was completed 10/18/2017. She was hospitalized in 01/2017 with diverticulitis and also had a pulmonary embolus. She recently lost her husband in 10/2016 due to pancreatic cancer. She reports a history of right shoulder and spine arthritis and hip pain that she did not get taken care of due to taking care of her husband and then being diagnosed herself.     Patient Stated Goals  to get rid of the pain and fullness in her right breast     Currently in Pain?  Yes    Pain Score  2     Pain Location  Breast    Pain Orientation  Right    Pain Descriptors / Indicators  Aching    Pain Type  Acute pain    Pain Radiating Towards  toward axilla                        [facility] Adult PT Treatment/Exercise - 12/07/17 0001      Exercises   Exercises  Shoulder;Elbow      Elbow Exercises   Elbow Flexion  Strengthening;Right    Bar Weights/Barbell (Elbow Flexion)  2 lbs    Elbow Extension  Strengthening    Bar Weights/Barbell (Elbow Extension)  2 lbs      Shoulder Exercises: Supine   Protraction  AROM;Right;10 reps    Flexion  AROM;Right;5 reps    Other Supine Exercises  hand pointed to ceiling with small circles x 10 then 10 more holding 2 # weight       Manual Therapy   Edema Management  assessed compression bra.  Pt has on Prarie "Vida" which does not provide the compression that she needs. She tried on the ABC donated bra that we had in the office and it seemed to fit well and was comfortable so she will try that. Also gave a piece of small dotted foam to wear at lateral breast and trunk where she is tender with palpable fullness.     Manual Lymphatic Drainage (MLD)  In supine, short neck, superficial and deep abdominals. right inguinal nodes and right axillo-inguinal anastamosis, then to left sidelying for posterior interaxillary anastamosis, lateral trunk  and axilla                   PT Long Term Goals - 11/24/17 2128      PT LONG TERM GOAL #1   Title  Pt will report that the pain in her breast is decreased to 1/10 and has decreased in frequency also.    Baseline  2/10    Time  4    Period  Weeks    Status  New      PT LONG TERM GOAL #2   Title  Pt will be independent in self manual lymph draianage and use of compression to manage right breast fullness    Time  4    Period  Weeks    Status  New      PT LONG TERM GOAL #3   Title  Pt will be independent in home exercise program to increase strength and posture    Time  4    Period  Weeks    Status  New            Plan - 12/07/17 1617    Clinical Impression Statement  Pt did not have a good fit with compression bra, but felt better with the one we had here. She will wear it with added compression at lateral breast and chest to try to get pain relief. Added some AROM today, but pt had most difficulty with sidelying shoulder abduction     Clinical Impairments Affecting Rehab Potential  radiation completed 10/18/2017    PT Treatment/Interventions  ADLs/Self Care Home Management;Therapeutic exercise;Patient/family education;Therapeutic activities;Manual techniques;Passive range of motion;Scar mobilization;Manual lymph drainage;Taping;Compression bandaging    PT Next Visit Plan  Assess compression bra and work toward shoudler exercise continue  MLD and reinforce self MLD  soft tissue work to right upper traps,work on shoulder ROM and stretching  Later, teach Strength ABC program     PT Home Exercise Plan  has already attended ABC class    Consulted and Agree with Plan of Care  Patient       Patient will benefit from skilled therapeutic intervention in order to improve the following deficits and impairments:  Decreased skin integrity, Increased fascial restricitons, Pain, Postural dysfunction, Decreased scar mobility, Increased edema  Visit Diagnosis: Lymphedema, not  elsewhere classified  Abnormal posture  Muscle weakness (generalized)  Aftercare following surgery for neoplasm     Problem List Patient Active Problem List   Diagnosis Date Noted  . Pulmonary embolism (  HCC) 07/27/2017  . Osteopenia 07/27/2017  . Malignant neoplasm of upper-outer quadrant of right breast in female, estrogen receptor positive (HCC) 07/22/2017  . Pure hypercholesterolemia 07/14/2017  . Palpitations 04/18/2017  . Shortness of breath 04/18/2017  . Atypical chest pain 03/26/2017  . Leukocytosis 03/26/2017  . Infected tooth 03/26/2017  . Panic attacks   . GERD (gastroesophageal reflux disease)   . Diverticulosis   . Anxiety   . Kidney disorder    Teresa K. Brown, PT  Brown, Teresa Krall 12/07/2017, 4:23 PM  [facility] [address] [place], [place], [address] Phone: [phone]   Fax:  [phone]  Name: Melissa [name] MRN: [id number] Date of Birth: 10/25/1950   

## 2017-12-09 ENCOUNTER — Ambulatory Visit: Payer: Medicare Other | Admitting: Physical Therapy

## 2017-12-09 DIAGNOSIS — I890 Lymphedema, not elsewhere classified: Secondary | ICD-10-CM | POA: Diagnosis not present

## 2017-12-09 DIAGNOSIS — R293 Abnormal posture: Secondary | ICD-10-CM

## 2017-12-09 DIAGNOSIS — Z483 Aftercare following surgery for neoplasm: Secondary | ICD-10-CM

## 2017-12-09 DIAGNOSIS — M6281 Muscle weakness (generalized): Secondary | ICD-10-CM

## 2017-12-09 NOTE — Patient Instructions (Addendum)
Over Head Pull: Narrow and Wide Grip   Cancer Rehab [phone]   On back, knees bent, feet flat, band across thighs, elbows straight but relaxed. Pull hands apart (start). Keeping elbows straight, bring arms up and over head, hands toward floor. Keep pull steady on band. Hold momentarily. Return slowly, keeping pull steady, back to start. Then do same with a wider grip on the band (past shoulder width) Repeat _5-10__ times. Band color __yellow____   Side Pull: Double Arm   On back, knees bent, feet flat. Arms perpendicular to body, shoulder level, elbows straight but relaxed. Pull arms out to sides, elbows straight. Resistance band comes across collarbones, hands toward floor. Hold momentarily. Slowly return to starting position. Repeat _5-10__ times. Band color _yellow____   Sword   On back, knees bent, feet flat, left hand on left hip, right hand above left. Pull right arm DIAGONALLY (hip to shoulder) across chest. Bring right arm along head toward floor. Hold momentarily. Slowly return to starting position. Repeat _5-10__ times. Do with left arm. Band color _yellow_____   Shoulder Rotation: Double Arm   On back, knees bent, feet flat, elbows tucked at sides, bent 90, hands palms up. Pull hands apart and down toward floor, keeping elbows near sides. Hold momentarily. Slowly return to starting position. Repeat _5-10__ times. Band color __yellow____     PELVIC TILT  Lie on back, legs bent. Exhale, tilting top of pelvis back, pubic bone up, to flatten lower back. Inhale, rolling pelvis opposite way, top forward, pubic bone down, arch in back. Repeat __10__ times. Do __2__ sessions per day. Copyright  VHI. All rights reserved.     Lie with hips and knees bent. Slowly inhale, and then exhale. Pull navel toward spine and tighten pelvic floor. Hold for __10_ seconds. Continue to breathe in and out during hold. Rest for _10__ seconds. Repeat __10_ times. Do __2-3_ times a  day.   Copyright  VHI. All rights reserved.  Knee Fold   Lie on back, legs bent, arms by sides. Exhale, lifting knee to chest. Inhale, returning. Keep abdominals flat, navel to spine. Repeat __10__ times, alternating legs. Do __2__ sessions per day.  Copyright  VHI. All rights reserved.  Knee Drop   Keep pelvis stable. Without rotating hips, slowly drop knee to side, pause, return to center, bring knee across midline toward opposite hip. Feel obliques engaging. Repeat for ___10_ times each leg.  Copyright  VHI. All rights reserved.  Isometric Hold With Pelvic Floor (Hook-Lying)    Copyright  VHI. All rights reserved.  Heel Slide to Straight   Slide one leg down to straight. Return. Be sure pelvis does not rock forward, tilt, rotate, or tip to side. Do _10__ times. Restabilize pelvis. Repeat with other leg. Do __1-2_ sets, __2_ times per day.  http://ss.exer.us/16   Copyright  VHI. All rights reserved.   

## 2017-12-09 NOTE — Therapy (Signed)
[place], [place], [address] Phone: [phone]   Fax:  [phone]  Physical Therapy Treatment  Patient Details  Name: Melissa [name] MRN: [id number] Date of Birth: [birth year] Referring Provider (PT): Shona Simpson   Encounter Date: 12/09/2017  PT End of Session - 12/09/17 1106    Visit Number  5    Number of Visits  9    Date for PT Re-Evaluation  12/24/17    PT Start Time  5830    PT Stop Time  1100    PT Time Calculation (min)  45 min    Activity Tolerance  Patient tolerated treatment well    Behavior During Therapy  [facility] for tasks assessed/performed       Past Medical History:  Diagnosis Date  . Anxiety   . Arthritis    shoulders, hands  . Cancer ([place]) 07/2017   right breast cancer  . Chest pain   . Diverticulosis   . GERD (gastroesophageal reflux disease)   . High cholesterol   . IBS (irritable bowel syndrome)   . Kidney disorder    Pt states only has left kidney  . Panic attacks   . Pulmonary embolism ([place]) 2018   pt states resolved and no longer takes blood thinners    Past Surgical History:  Procedure Laterality Date  . BREAST LUMPECTOMY WITH RADIOACTIVE SEED AND SENTINEL LYMPH NODE BIOPSY Right 08/23/2017   Procedure: BREAST LUMPECTOMY WITH RADIOACTIVE SEED AND SENTINEL LYMPH NODE BIOPSY;  Surgeon: Erroll Luna, MD;  Location: [facility];  Service: General;  Laterality: Right;  . TUBAL LIGATION      There were no vitals filed for this visit.  Subjective Assessment - 12/09/17 1022    Subjective  Pt states that she is feeling better. She said she wore the compression bra and foam all day yesterday and she does not have as much pain in her breast although she still is a little sore, especially when it rains     Pertinent History  Patient was diagnosed on 07/12/17 with right Invasive ductal carcinoma breast cancer. It is ER/PR positive and HER2 negative with a  Ki67 of 10%. Patient underwent a right lumpectomy and sentinel node biopsy (0/5 nodes positive) on 08/23/17.  Radiation was completed 10/18/2017. She was hospitalized in 01/2017 with diverticulitis and also had a pulmonary embolus. She recently lost her husband in 10/2016 due to pancreatic cancer. She reports a history of right shoulder and spine arthritis and hip pain that she did not get taken care of due to taking care of her husband and then being diagnosed herself.     Patient Stated Goals  to get rid of the pain and fullness in her right breast     Currently in Pain?  Yes    Pain Score  2     Pain Location  Breast    Pain Orientation  Right    Pain Descriptors / Indicators  Aching    Pain Radiating Towards  toward axilla     Pain Onset  1 to 4 weeks ago    Pain Frequency  Intermittent                       [facility] Adult PT Treatment/Exercise - 12/09/17 0001      Exercises   Exercises  Shoulder;Lumbar      Lumbar Exercises: Supine   Pelvic Tilt  5 reps  Clam  5 reps    Heel Slides  5 reps    Bent Knee Raise  5 reps    Bridge  5 reps    Other Supine Lumbar Exercises  lower trunk rotation      Shoulder Exercises: Supine   Horizontal ABduction  Strengthening;Right;Left;5 reps;Theraband    Theraband Level (Shoulder Horizontal ABduction)  Level 1 (Yellow)    External Rotation  Strengthening;Right;Left;5 reps;Theraband    Theraband Level (Shoulder External Rotation)  Level 1 (Yellow)    Flexion  Strengthening;Right;Left;5 reps;Theraband    Theraband Level (Shoulder Flexion)  Level 1 (Yellow)    Diagonals  Strengthening;Right;Left;5 reps;Theraband    Theraband Level (Shoulder Diagonals)  Level 1 (Yellow)      Shoulder Exercises: Pulleys   Flexion  2 minutes    ABduction  2 minutes      Shoulder Exercises: ROM/Strengthening   Ball on Wall  yellow ball up the wall forwards and to left side       Manual Therapy   Manual Lymphatic Drainage (MLD)  briefly in left  sidelying to posterior interaxillary anastamosis and lateral chest             PT Education - 12/09/17 1106    Education Details  gave pt information bout [facility]    Person(s) Educated  Patient    Methods  Explanation;Handout          PT Long Term Goals - 11/24/17 2128      PT LONG TERM GOAL #1   Title  Pt will report that the pain in her breast is decreased to 1/10 and has decreased in frequency also.    Baseline  2/10    Time  4    Period  Weeks    Status  New      PT LONG TERM GOAL #2   Title  Pt will be independent in self manual lymph draianage and use of compression to manage right breast fullness    Time  4    Period  Weeks    Status  New      PT LONG TERM GOAL #3   Title  Pt will be independent in home exercise program to increase strength and posture    Time  4    Period  Weeks    Status  New            Plan - 12/09/17 1107    Clinical Impression Statement  Pt is seeing improvement in her symptoms and is tolerating her new compression bra better.  Upgraded to exericse for stretching and stengthening and instructed in home progam for supine scapular strengthening and core work. Pt felt good with the exercise and feels she is ready to progress     Rehab Potential  Good    Clinical Impairments Affecting Rehab Potential  radiation completed 10/18/2017    PT Treatment/Interventions  ADLs/Self Care Home Management;Therapeutic exercise;Patient/family education;Therapeutic activities;Manual techniques;Passive range of motion;Scar mobilization;Manual lymph drainage;Taping;Compression bandaging    PT Next Visit Plan  Begin Strength ABC program and progress slowly monitoring response Assess compression bra and work toward shoudler exercise continue  MLD and reinforce self MLD  soft tissue work to right upper traps,work on shoulder ROM and stretching  Later, teach Strength ABC program     Consulted and Agree with Plan of Care  Patient       Patient will benefit from  skilled therapeutic intervention in order to improve the following deficits and  impairments:  Decreased skin integrity, Increased fascial restricitons, Pain, Postural dysfunction, Decreased scar mobility, Increased edema  Visit Diagnosis: Lymphedema, not elsewhere classified  Abnormal posture  Muscle weakness (generalized)  Aftercare following surgery for neoplasm     Problem List Patient Active Problem List   Diagnosis Date Noted  . Pulmonary embolism ([place]) 07/27/2017  . Osteopenia 07/27/2017  . Malignant neoplasm of upper-outer quadrant of right breast in female, estrogen receptor positive ([place]) 07/22/2017  . Pure hypercholesterolemia 07/14/2017  . Palpitations 04/18/2017  . Shortness of breath 04/18/2017  . Atypical chest pain 03/26/2017  . Leukocytosis 03/26/2017  . Infected tooth 03/26/2017  . Panic attacks   . GERD (gastroesophageal reflux disease)   . Diverticulosis   . Anxiety   . Kidney disorder    Donato Heinz. Owens Shark PT  [place]o 12/09/2017, [place], [place], [address] Phone: [phone]   Fax:  [phone]  Name: Melissa [name] MRN: [id number] Date of Birth: 1950/05/27

## 2017-12-13 ENCOUNTER — Ambulatory Visit: Payer: Medicare Other | Admitting: Physical Therapy

## 2017-12-13 ENCOUNTER — Encounter: Payer: Self-pay | Admitting: Physical Therapy

## 2017-12-13 DIAGNOSIS — M6281 Muscle weakness (generalized): Secondary | ICD-10-CM

## 2017-12-13 DIAGNOSIS — I890 Lymphedema, not elsewhere classified: Secondary | ICD-10-CM

## 2017-12-13 DIAGNOSIS — R293 Abnormal posture: Secondary | ICD-10-CM

## 2017-12-13 DIAGNOSIS — Z483 Aftercare following surgery for neoplasm: Secondary | ICD-10-CM

## 2017-12-13 NOTE — Patient Instructions (Signed)

## 2017-12-13 NOTE — Therapy (Signed)
[place], [place], [address] Phone: [phone]   Fax:  [phone]  Physical Therapy Treatment  Patient Details  Name: Melissa [name] MRN: [id number] Date of Birth: July 28, 1950 Referring Provider (PT): Shona Simpson   Encounter Date: 12/13/2017  PT End of Session - 12/13/17 1106    Visit Number  6    Number of Visits  9    Date for PT Re-Evaluation  12/24/17    PT Start Time  5027    PT Stop Time  1100    PT Time Calculation (min)  45 min    Activity Tolerance  Patient tolerated treatment well    Behavior During Therapy  [facility] for tasks assessed/performed       Past Medical History:  Diagnosis Date  . Anxiety   . Arthritis    shoulders, hands  . Cancer ([place]) 07/2017   right breast cancer  . Chest pain   . Diverticulosis   . GERD (gastroesophageal reflux disease)   . High cholesterol   . IBS (irritable bowel syndrome)   . Kidney disorder    Pt states only has left kidney  . Panic attacks   . Pulmonary embolism ([place]) 2018   pt states resolved and no longer takes blood thinners    Past Surgical History:  Procedure Laterality Date  . BREAST LUMPECTOMY WITH RADIOACTIVE SEED AND SENTINEL LYMPH NODE BIOPSY Right 08/23/2017   Procedure: BREAST LUMPECTOMY WITH RADIOACTIVE SEED AND SENTINEL LYMPH NODE BIOPSY;  Surgeon: Erroll Luna, MD;  Location: [facility];  Service: General;  Laterality: Right;  . TUBAL LIGATION      There were no vitals filed for this visit.  Subjective Assessment - 12/13/17 1022    Subjective  Pt states she had a fall this morning as she tripped on a limb while up with her dog.  She feels like the only thing she hurt her bunion on her toe    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 Patient underwent a right lumpectomy and sentinel node  biopsy (0/5 nodes positive) on 08/23/17.  Radiation was completed 10/18/2017. She was hospitalized in 01/2017 with diverticulitis and also had a pulmonary embolus. She recently lost her husband in 10/2016 due to pancreatic cancer. She reports a history of right shoulder and spine arthritis and hip pain that she did not get taken care of due to taking care of her husband and then being diagnosed herself.     Patient Stated Goals  to get rid of the pain and fullness in her right breast     Currently in Pain?  Yes    Pain Score  2     Pain Location  Foot    Pain Orientation  Right    Pain Descriptors / Indicators  Sharp    Aggravating Factors   --   just when she walks                       [facility] Adult PT Treatment/Exercise - 12/13/17 0001      Exercises   Exercises  Shoulder;Lumbar      Shoulder Exercises: Standing   Other Standing Exercises  Rockwood series with red theraband x 5 repetitions    verbal cues folded towel at waist for IR and ER     Shoulder Exercises:  Pulleys   Flexion  2 minutes    ABduction  2 minutes      Shoulder Exercises: ROM/Strengthening   Ball on Wall  yellow ball up the wall forwards and to left side     Other ROM/Strengthening Exercises  modified downward dog stretch       Manual Therapy   Edema Management  showed pt the [facility] [place] as an alternative to her compression bra     Manual Lymphatic Drainage (MLD)  briefly in left sidelying to posterior interaxillary anastamosis and lateral chest             PT Education - 12/13/17 1103    Education Details  Rockwood exercise program     Person(s) Educated  Patient    Methods  Explanation;Demonstration;Handout    Comprehension  Verbalized understanding;Returned demonstration          PT Long Term Goals - 11/24/17 2128      PT LONG TERM GOAL #1   Title  Pt will report that the pain in her breast is decreased to 1/10 and has decreased in frequency also.    Baseline  2/10     Time  4    Period  Weeks    Status  New      PT LONG TERM GOAL #2   Title  Pt will be independent in self manual lymph draianage and use of compression to manage right breast fullness    Time  4    Period  Weeks    Status  New      PT LONG TERM GOAL #3   Title  Pt will be independent in home exercise program to increase strength and posture    Time  4    Period  Weeks    Status  New            Plan - 12/13/17 1106    Clinical Impression Statement  Pt continues to have improvement with her right shoulder and upgraded strength program.  She continues to have mild swelling in right posterior shoulder and lateral chest and will look at other compression options     Clinical Impairments Affecting Rehab Potential  radiation completed 10/18/2017    PT Frequency  2x / week    PT Duration  4 weeks    PT Next Visit Plan  reassess for goals and assess effect of stretching and rockwoods.  Continue with review of stretches and instruction of strength  Strength ABC program and progress slowly monitoring response Assess compression bra and work toward shoudler exercise continue  MLD and reinforce self MLD      Consulted and Agree with Plan of Care  Patient       Patient will benefit from skilled therapeutic intervention in order to improve the following deficits and impairments:  Decreased skin integrity, Increased fascial restricitons, Pain, Postural dysfunction, Decreased scar mobility, Increased edema  Visit Diagnosis: Lymphedema, not elsewhere classified  Abnormal posture  Muscle weakness (generalized)  Aftercare following surgery for neoplasm     Problem List Patient Active Problem List   Diagnosis Date Noted  . Pulmonary embolism ([place]) 07/27/2017  . Osteopenia 07/27/2017  . Malignant neoplasm of upper-outer quadrant of right breast in female, estrogen receptor positive ([place]) 07/22/2017  . Pure hypercholesterolemia 07/14/2017  . Palpitations 04/18/2017  . Shortness of  breath 04/18/2017  . Atypical chest pain 03/26/2017  . Leukocytosis 03/26/2017  . Infected tooth 03/26/2017  . Panic attacks   .  GERD (gastroesophageal reflux disease)   . Diverticulosis   . Anxiety   . Kidney disorder    Donato Heinz. Owens Shark PT  [place] [facility]o 12/13/2017, 12:15 PM  [place], [place], [address] Phone: [phone]   Fax:  [phone]  Name: Melissa [name] MRN: [id number] Date of Birth: 1950/07/17

## 2017-12-14 ENCOUNTER — Telehealth: Payer: Self-pay | Admitting: Internal Medicine

## 2017-12-14 ENCOUNTER — Other Ambulatory Visit: Payer: Medicare Other | Admitting: *Deleted

## 2017-12-14 DIAGNOSIS — E7800 Pure hypercholesterolemia, unspecified: Secondary | ICD-10-CM

## 2017-12-14 LAB — LIPID PANEL
Chol/HDL Ratio: 3.6 ratio (ref 0.0–4.4)
Cholesterol, Total: 207 mg/dL — ABNORMAL HIGH (ref 100–199)
HDL: 58 mg/dL (ref >39)
LDL Calculated: 130 mg/dL — ABNORMAL HIGH (ref 0–99)
Triglycerides: 93 mg/dL (ref 0–149)
VLDL Cholesterol Cal: 19 mg/dL (ref 5–40)

## 2017-12-14 LAB — ALT: ALT: 21 IU/L (ref 0–32)

## 2017-12-14 NOTE — Telephone Encounter (Signed)
I recommend rechecking a fasting lipid panel at the patient's convenience to reassess her lipids off rosuvastatin and on anastrozole.  Based on the results, we may be able to work on lifestyle modifications or need to refer her to the lipid clinic.  Nelva Bush, MD [facility] Pager: [phone]

## 2017-12-14 NOTE — Telephone Encounter (Signed)
Spoke to the patient and informed her of Dr Darnelle Bos recommendation.  She came in for fasting labs this morning (11/20) and I told her we will await the results and call with further recommendations.

## 2017-12-14 NOTE — Telephone Encounter (Signed)
New message   Pt c/o medication issue:  1. Name of Medication: rouvastatin  2. How are you currently taking this medication (dosage and times per day)?  Not taking anymore   3. Are you having a reaction (difficulty breathing--STAT)? no  4. What is your medication issue? Was giving her body aches, she tried to take it for 2 weeks , but it made her hurt all over, she was diagnosed with breast cancer and she is taking  anastrozole (ARIMIDEX) 1 MG tablet Take 1 tablet (1 mg total) by mouth daily. Patient not taking: Reported on 11/23/2017    and was told this will increase her cholesterol level.  How would you like her to proceed?  Came in 12/14/17 742a to have her lipid blood work done

## 2017-12-15 ENCOUNTER — Ambulatory Visit: Payer: Medicare Other | Admitting: Physical Therapy

## 2017-12-15 DIAGNOSIS — R293 Abnormal posture: Secondary | ICD-10-CM

## 2017-12-15 DIAGNOSIS — M6281 Muscle weakness (generalized): Secondary | ICD-10-CM

## 2017-12-15 DIAGNOSIS — Z483 Aftercare following surgery for neoplasm: Secondary | ICD-10-CM

## 2017-12-15 DIAGNOSIS — I890 Lymphedema, not elsewhere classified: Secondary | ICD-10-CM | POA: Diagnosis not present

## 2017-12-15 NOTE — Therapy (Signed)
[place], [place], [address] Phone: [phone]   Fax:  [phone]  Physical Therapy Treatment  Patient Details  Name: Melissa [name] MRN: [id number] Date of Birth: 09/27/1950 Referring Provider (PT): Shona Simpson   Encounter Date: 12/15/2017  PT End of Session - 12/15/17 1149    Visit Number  7    Number of Visits  9    Date for PT Re-Evaluation  12/24/17    PT Start Time  1100    PT Stop Time  1150    PT Time Calculation (min)  50 min    Activity Tolerance  Patient tolerated treatment well    Behavior During Therapy  [facility] for tasks assessed/performed       Past Medical History:  Diagnosis Date  . Anxiety   . Arthritis    shoulders, hands  . Cancer ([place]) 07/2017   right breast cancer  . Chest pain   . Diverticulosis   . GERD (gastroesophageal reflux disease)   . High cholesterol   . IBS (irritable bowel syndrome)   . Kidney disorder    Pt states only has left kidney  . Panic attacks   . Pulmonary embolism ([place]) 2018   pt states resolved and no longer takes blood thinners    Past Surgical History:  Procedure Laterality Date  . BREAST LUMPECTOMY WITH RADIOACTIVE SEED AND SENTINEL LYMPH NODE BIOPSY Right 08/23/2017   Procedure: BREAST LUMPECTOMY WITH RADIOACTIVE SEED AND SENTINEL LYMPH NODE BIOPSY;  Surgeon: Erroll Luna, MD;  Location: [place];  Service: General;  Laterality: Right;  . TUBAL LIGATION      There were no vitals filed for this visit.  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 Patient underwent a right lumpectomy and  sentinel node biopsy (0/5 nodes positive) on 08/23/17.  Radiation was completed 10/18/2017. She was hospitalized in 01/2017 with diverticulitis and also had a pulmonary embolus. She recently lost her husband in 10/2016 due to pancreatic cancer. She reports a history of right shoulder and spine arthritis and hip pain that she did not get taken care of due to taking care of her husband and then being diagnosed herself.     Patient Stated Goals  to get rid of the pain and fullness in her right breast     Currently in Pain?  Yes    Pain Score  1     Pain Location  Foot   bunion                       [facility] Adult PT Treatment/Exercise - 12/15/17 0001      Self-Care   Other Self-Care Comments   gave pt information about yoga and tai chi at cancer center and the aqua aerobics class through [employer],  Pt says she has a full gym with a pool at her church and she plans to go exercise there She is interested in doing her exercise there       Exercises   Exercises  Other Exercises    Other Exercises  reviewed Strength ABC program basics and how to progress weights, practiced stretches and core exercises with clams instead of situps.  She had some cramping in right hamstrings with bridges and decreased range of motion with rotation in clams.  Modified superwoman to do on bed instead of down on the floor. Also instructed in sit to stand as she has hip weakness.                   PT Long Term Goals - 12/15/17 1216      PT LONG TERM GOAL #1   Title  Pt will report that the pain in her breast is decreased to 1/10 and has decreased in frequency also.    Status  Achieved      PT LONG TERM GOAL #2   Title  Pt will be independent in self manual lymph draianage and use of compression to manage right breast fullness    Status  Achieved      PT LONG TERM GOAL #3   Title  Pt will be independent in home exercise program to increase strength and posture    Time  4    Period  Weeks     Status  On-going            Plan - 12/15/17 1209    Clinical Impression Statement  Progressed exercise instruction to Strength ABC and education about community exercise. Calculated her target heart rate ranges ( light: 95-103, and moderate: 103-120) will need to tell her that next session and correlate with RPE . Told her about the tip to " talk, but not sing"  Pt has pain in right hip with weakness and tightness in both hips so will benefit from the Strength ABC exercises for this also     Clinical Impairments Affecting Rehab Potential  radiation completed 10/18/2017    PT Frequency  2x / week    PT Duration  4 weeks    PT Treatment/Interventions  ADLs/Self Care Home Management;Therapeutic exercise;Patient/family education;Therapeutic activities;Manual techniques;Passive range of motion;Scar mobilization;Manual lymph drainage;Taping;Compression bandaging    PT Next Visit Plan  Teach about target heart rate as in clinical impression sectin  continue with instruction of strength  Strength ABC program starting with chest press being aware of hip pain and weakness and progress slowly monitoring response    PT Home Exercise Plan  has already attended ABC class, calculated target heart rates with Heart rate reserve method:  for light exercise 95-103, Moderate exercise: 103-120    Consulted and Agree with Plan of Care  Patient       Patient will benefit from skilled therapeutic intervention in order to improve the following deficits and impairments:  Decreased skin integrity, Increased fascial restricitons, Pain, Postural dysfunction, Decreased scar mobility, Increased edema  Visit Diagnosis: Lymphedema, not elsewhere classified  Abnormal posture  Muscle weakness (generalized)  Aftercare following surgery for neoplasm     Problem List Patient Active Problem List   Diagnosis Date Noted  . Pulmonary embolism ([place]) 07/27/2017  . Osteopenia 07/27/2017  . Malignant neoplasm of  upper-outer quadrant of right breast in female, estrogen receptor positive ([place]) 07/22/2017  . Pure hypercholesterolemia 07/14/2017  . Palpitations 04/18/2017  . Shortness of breath 04/18/2017  . Atypical chest pain 03/26/2017  . Leukocytosis 03/26/2017  . Infected tooth 03/26/2017  . Panic attacks   . GERD (gastroesophageal reflux disease)   . Diverticulosis   . Anxiety   . Kidney disorder    Helene Kelp  Bethena Midget, PT  [place] [facility]o 12/15/2017, 12:18 PM  [place], [place], [address] Phone: [phone]   Fax:  [phone]  Name: Melissa [name] MRN: [id number] Date of Birth: [birth year]

## 2017-12-16 ENCOUNTER — Telehealth: Payer: Self-pay

## 2017-12-16 DIAGNOSIS — E7800 Pure hypercholesterolemia, unspecified: Secondary | ICD-10-CM

## 2017-12-16 NOTE — Telephone Encounter (Signed)
-----   Message from Nelva Bush, MD sent at 12/16/2017  7:37 AM EST ----- Please let Ms. Sheahan know that her cholesterol is still mildly elevated but improved compared with 8 months ago.  Since she did not tolerate rosuvastatin, I think it is reasonable to defer trying any other cholesterol therapy for now and to continue to work on lifestyle modifications.  Lipid panel should be rechecked in about 6 months, given concern for her cancer therapy affecting her cholesterol.

## 2017-12-16 NOTE — Telephone Encounter (Signed)
Patient made aware of results and recommendations from Dr. Saunders Revel. Repeat fasting LIPIDS ordered and scheduled for 5/22.   Patient is also due for f/u with Dr. Saunders Revel in December. It does not look like she has been reassigned to another cardiologist yet. Will forward to Dr. Darnelle Bos RN for review.

## 2017-12-19 ENCOUNTER — Ambulatory Visit: Payer: Medicare Other | Admitting: Rehabilitation

## 2017-12-19 NOTE — Telephone Encounter (Signed)
Spoke to patient who is due for a 6 month f/u OV with you.  I told her that your only available day here at the [facility] office is 12/5, but she is unavailable.  She wants to follow you in [place].

## 2017-12-21 ENCOUNTER — Telehealth: Payer: Self-pay

## 2017-12-21 ENCOUNTER — Ambulatory Visit: Payer: Medicare Other | Admitting: Rehabilitation

## 2017-12-21 NOTE — Telephone Encounter (Signed)
Spoke with patient reminding of SCP visit with NP on 12/29/17 at 2 pm.  Patient says she will come to appt.

## 2017-12-29 ENCOUNTER — Encounter: Payer: Self-pay | Admitting: Adult Health

## 2017-12-29 ENCOUNTER — Telehealth: Payer: Self-pay | Admitting: Oncology

## 2017-12-29 ENCOUNTER — Inpatient Hospital Stay: Payer: Medicare Other | Attending: Oncology | Admitting: Adult Health

## 2017-12-29 VITALS — BP 154/75 | HR 91 | Temp 98.0°F | Resp 18 | Ht 64.0 in | Wt 171.6 lb

## 2017-12-29 DIAGNOSIS — R232 Flushing: Secondary | ICD-10-CM | POA: Diagnosis not present

## 2017-12-29 DIAGNOSIS — K219 Gastro-esophageal reflux disease without esophagitis: Secondary | ICD-10-CM | POA: Insufficient documentation

## 2017-12-29 DIAGNOSIS — Z87891 Personal history of nicotine dependence: Secondary | ICD-10-CM | POA: Insufficient documentation

## 2017-12-29 DIAGNOSIS — Z8249 Family history of ischemic heart disease and other diseases of the circulatory system: Secondary | ICD-10-CM | POA: Diagnosis not present

## 2017-12-29 DIAGNOSIS — C50411 Malignant neoplasm of upper-outer quadrant of right female breast: Secondary | ICD-10-CM | POA: Insufficient documentation

## 2017-12-29 DIAGNOSIS — Z923 Personal history of irradiation: Secondary | ICD-10-CM | POA: Diagnosis not present

## 2017-12-29 DIAGNOSIS — Z170 Estrogen receptor positive status [ER+]: Secondary | ICD-10-CM | POA: Diagnosis not present

## 2017-12-29 DIAGNOSIS — Z79899 Other long term (current) drug therapy: Secondary | ICD-10-CM

## 2017-12-29 DIAGNOSIS — Z79811 Long term (current) use of aromatase inhibitors: Secondary | ICD-10-CM | POA: Diagnosis not present

## 2017-12-29 DIAGNOSIS — Z86711 Personal history of pulmonary embolism: Secondary | ICD-10-CM | POA: Diagnosis not present

## 2017-12-29 NOTE — Telephone Encounter (Signed)
Gave avs and calendar ° °

## 2017-12-29 NOTE — Progress Notes (Signed)
CLINIC:  Survivorship   REASON FOR VISIT:  Routine follow-up post-treatment for a recent history of breast cancer.  BRIEF ONCOLOGIC HISTORY:    Malignant neoplasm of upper-outer quadrant of right breast in female, estrogen receptor positive ([place])   07/19/2017 Initial Diagnosis    status post right breast upper outer quadrant biopsy for a clinical T1c NX, stage IA invasive ductal carcinoma, grade 2, estrogen and progesterone receptor positive, HER-2 not amplified, with an MIB-1 of 10%             (a) 1 of 4 suspicious axillary lymph nodes biopsied 07/19/2017 was negative    08/23/2017 Surgery    status post right lumpectomy and sentinel lymph node sampling for a pT1c pN0, stage IA invasive ductal carcinoma, grade 2, with negative margins             (a) total of 5 sentinel lymph nodes removed    08/23/2017 Oncotype testing    12/3%     09/07/2017 Cancer Staging    Staging form: Breast, AJCC 8th Edition - Pathologic: Stage IA (pT1c, pN0, cM0, G2, ER+, PR+, HER2-, Oncotype DX score: 12) - Signed by Gardenia Phlegm, NP on 09/07/2017    09/27/2017 - 10/24/2017 Radiation Therapy    The patient initially received a dose of 42.56 Gy in 16 fractions to the breast using whole-breast tangent fields. This was delivered using a 3-D conformal technique. The patient then received a boost to the seroma. This delivered an additional 8 Gy in 4 fractions using a 3 field photon technique due to the depth of the seroma. The total dose was 50.56 Gy.    10/2017 -  Anti-estrogen oral therapy    Anastrozole daily     INTERVAL HISTORY:  Melissa [name] presents to the [facility] today for our initial meeting to review her survivorship care plan detailing her treatment course for breast cancer, as well as monitoring long-term side effects of that treatment, education regarding health maintenance, screening, and overall wellness and health promotion.     Overall, Melissa [name] reports feeling quite  well.  She is taking anastrozole and notes occasional hot flashes.  She did undergo DEXA at [facility].  She is feeling well today otherwise.      REVIEW OF SYSTEMS:  Review of Systems  Constitutional: Negative for appetite change, chills, fatigue, fever and unexpected weight change.  HENT:   Negative for hearing loss and lump/mass.   Eyes: Negative for eye problems and icterus.  Respiratory: Negative for chest tightness, cough and shortness of breath.   Cardiovascular: Negative for chest pain, leg swelling and palpitations.  Gastrointestinal: Negative for abdominal distention, abdominal pain, constipation, diarrhea, nausea and vomiting.  Endocrine: Positive for hot flashes.  Musculoskeletal: Negative for arthralgias.  Skin: Negative for itching and rash.  Neurological: Negative for dizziness, extremity weakness, headaches and numbness.  Hematological: Negative for adenopathy. Does not bruise/bleed easily.  Psychiatric/Behavioral: Negative for depression. The patient is not nervous/anxious.         ONCOLOGY TREATMENT TEAM:  1. Surgeon:  Dr. Brantley Stage at [facility] 2. Medical Oncologist: Dr. Jana Hakim  3. Radiation Oncologist: Dr. Lisbeth Renshaw    PAST MEDICAL/SURGICAL HISTORY:  Past Medical History:  Diagnosis Date  . Anxiety   . Arthritis    shoulders, hands  . Cancer ([place]) 07/2017   right breast cancer  . Chest pain   . Diverticulosis   . GERD (gastroesophageal reflux disease)   . High cholesterol   .  IBS (irritable bowel syndrome)   . Kidney disorder    Pt states only has left kidney  . Panic attacks   . Pulmonary embolism ([place]) 2018   pt states resolved and no longer takes blood thinners   Past Surgical History:  Procedure Laterality Date  . BREAST LUMPECTOMY WITH RADIOACTIVE SEED AND SENTINEL LYMPH NODE BIOPSY Right 08/23/2017   Procedure: BREAST LUMPECTOMY WITH RADIOACTIVE SEED AND SENTINEL LYMPH NODE BIOPSY;  Surgeon: Erroll Luna, MD;  Location: [facility];  Service: General;  Laterality: Right;  . TUBAL LIGATION       ALLERGIES:  Allergies  Allergen Reactions  . Clindamycin Diarrhea and Nausea Only     CURRENT MEDICATIONS:  Outpatient Encounter Medications as of 12/29/2017  Medication Sig Note  . acetaminophen (TYLENOL) 500 MG tablet Take 1,000 mg by mouth every 6 (six) hours as needed for mild pain.   Marland Kitchen ALPRAZolam (XANAX) 0.25 MG tablet 3 (three) times daily. TAKE 1/2 TABLET IN THE MORNING AND 1/2 IN THE EVENING AT BEDTIME   . anastrozole (ARIMIDEX) 1 MG tablet Take 1 tablet (1 mg total) by mouth daily. (Patient not taking: Reported on 11/23/2017)   . cholecalciferol 2000 units tablet Take 0.5 tablets (1,000 Units total) by mouth daily.   . hydrocortisone cream 1 % Apply 1 application topically as needed for itching.   . hyoscyamine (ANASPAZ) 0.125 MG TBDP disintergrating tablet Take 0.125 mg by mouth every 6 (six) hours as needed.   Marland Kitchen omeprazole (PRILOSEC) 20 MG capsule Take 20 mg by mouth 2 (two) times daily before a meal.   . rosuvastatin (CRESTOR) 5 MG tablet Take 5 mg 1 tablet on Monday-Wednesday-Friday (Patient not taking: Reported on 11/23/2017) 07/27/2017: Has prescription but has not started taking yet   No facility-administered encounter medications on file as of 12/29/2017.      ONCOLOGIC FAMILY HISTORY:  Family History  Problem Relation Age of Onset  . Alzheimer's disease Mother   . Stroke Father 66  . Heart attack Maternal Grandmother   . Heart attack Paternal Grandmother   . Cervical cancer Cousin   . Colon cancer Paternal Aunt   . Colon cancer Cousin      GENETIC COUNSELING/TESTING: Not at this time  SOCIAL HISTORY:  Social History   Socioeconomic History  . Marital status: Widowed    Spouse name: Not on file  . Number of children: Not on file  . Years of education: Not on file  . Highest education level: Not on file  Occupational History  . Not on file  Social Needs  . Financial  resource strain: Not on file  . Food insecurity:    Worry: Not on file    Inability: Not on file  . Transportation needs:    Medical: Not on file    Non-medical: Not on file  Tobacco Use  . Smoking status: Former Smoker    Packs/day: 20.00    Years: 1.00    Pack years: 20.00    Types: Cigarettes    Last attempt to quit: 1985    Years since quitting: 34.9  . Smokeless tobacco: Never Used  Substance and Sexual Activity  . Alcohol use: Yes    Frequency: Never    Comment: social  . Drug use: Never  . Sexual activity: Not Currently  Lifestyle  . Physical activity:    Days per week: Not on file    Minutes per session: Not on file  . Stress: Not  on file  Relationships  . Social connections:    Talks on phone: Not on file    Gets together: Not on file    Attends religious service: Not on file    Active member of club or organization: Not on file    Attends meetings of clubs or organizations: Not on file    Relationship status: Not on file  . Intimate partner violence:    Fear of current or ex partner: No    Emotionally abused: No    Physically abused: No    Forced sexual activity: No  Other Topics Concern  . Not on file  Social History Narrative   09-13-17 Unable to ask abuse questions daughter with her today.      PHYSICAL EXAMINATION:  Vital Signs:   Vitals:   12/29/17 1350  BP: (!) 154/75  Pulse: 91  Resp: 18  Temp: 98 F (36.7 C)  SpO2: 97%   Filed Weights   12/29/17 1350  Weight: 171 lb 9.6 oz (77.8 kg)   General: Well-nourished, well-appearing female in no acute distress.  She is unaccompaniedtoday.   HEENT: Head is normocephalic.  Pupils equal and reactive to light. Conjunctivae clear without exudate.  Sclerae anicteric. Oral mucosa is pink, moist.  Oropharynx is pink without lesions or erythema.  Lymph: No cervical, supraclavicular, or infraclavicular lymphadenopathy noted on palpation.  Cardiovascular: Regular rate and rhythm.Marland Kitchen Respiratory: Clear  to auscultation bilaterally. Chest expansion symmetric; breathing non-labored.  Breasts: right breast s/p lumpectomy, no sign of local recurrence, left breast benign GI: Abdomen soft and round; non-tender, non-distended. Bowel sounds normoactive.  GU: Deferred.  Neuro: No focal deficits. Steady gait.  Psych: Mood and affect normal and appropriate for situation.  Extremities: No edema. MSK: No focal spinal tenderness to palpation.  Full range of motion in bilateral upper extremities Skin: Warm and dry.  LABORATORY DATA:  None for this visit.  DIAGNOSTIC IMAGING:  None for this visit.      ASSESSMENT AND PLAN:  Ms.. [name] is a pleasant 67 y.o. female with Stage IA right breast invasive ductal carcinoma, ER+/PR+/HER2-, diagnosed in 06/2017, treated with lumpectomy, adjuvant radiation therapy, and anti-estrogen therapy with Anastrozole beginning in 10/2017.  She presents to the Survivorship Clinic for our initial meeting and routine follow-up post-completion of treatment for breast cancer.    1. Stage IA right breast cancer:  Melissa [name] is continuing to recover from definitive treatment for breast cancer. She will follow-up with her medical oncologist, Dr. Jana Hakim in 02/2018 with history and physical exam per surveillance protocol.  She will continue her anti-estrogen therapy with Anastrozole. Thus far, she is tolerating the Anastrozole well, with minimal side effects. Today, a comprehensive survivorship care plan and treatment summary was reviewed with the patient today detailing her breast cancer diagnosis, treatment course, potential late/long-term effects of treatment, appropriate follow-up care with recommendations for the future, and patient education resources.  A copy of this summary, along with a letter will be sent to the patient's primary care provider via mail/fax/In Basket message after today's visit.    2. Bone health:  Given Melissa [name]'s age/history of breast cancer and her  current treatment regimen including anti-estrogen therapy with Anastrozole, she is at risk for bone demineralization.She underwent DEXA at [facility] last year and her T score was -1.9.  I sent these results to scan so that Dr. Jana Hakim wil be able to review with her at her next appt.  She will need bone density in 2020.  She was given education on specific activities to promote bone health.  3. Cancer screening:  Due to Melissa [name]'s history and her age, she should receive screening for skin cancers, colon cancer, and gynecologic cancers.  The information and recommendations are listed on the patient's comprehensive care plan/treatment summary and were reviewed in detail with the patient.    4. Health maintenance and wellness promotion: Melissa [name] was encouraged to consume 5-7 servings of fruits and vegetables per day. We reviewed the "Nutrition Rainbow" handout, as well as the handout "Take Control of Your Health and Reduce Your Cancer Risk" from the [facility].  She was also encouraged to engage in moderate to vigorous exercise for 30 minutes per day most days of the week. We discussed the [employer] fitness program, which is designed for cancer survivors to help them become more physically fit after cancer treatments.  She was instructed to limit her alcohol consumption and continue to abstain from tobacco use.  5.  Support services/counseling: It is not uncommon for this period of the patient's cancer care trajectory to be one of many emotions and stressors.  We discussed an opportunity for her to participate in the next session of [facility] ("Finding Your New Normal") support group series designed for patients after they have completed treatment.   Melissa [name] was encouraged to take advantage of our many other support services programs, support groups, and/or counseling in coping with her new life as a cancer survivor after completing anti-cancer treatment.  She was offered support today through  active listening and expressive supportive counseling.  She was given information regarding our available services and encouraged to contact me with any questions or for help enrolling in any of our support group/programs.    Dispo:   -Return to cancer center in 02/2018 for f/u with Dr. Jana Hakim  -Mammogram due in 06/2018 -She is welcome to return back to the Survivorship Clinic at any time; no additional follow-up needed at this time.  -Consider referral back to survivorship as a long-term survivor for continued surveillance  A total of (30) minutes of face-to-face time was spent with this patient with greater than 50% of that time in counseling and care-coordination.   Gardenia Phlegm, NP Survivorship Program [place] [phone]   Note: PRIMARY CARE PROVIDER [place], [place] At [phone]

## 2017-12-30 ENCOUNTER — Ambulatory Visit: Payer: Medicare Other | Attending: Surgery | Admitting: Physical Therapy

## 2017-12-30 ENCOUNTER — Encounter

## 2017-12-30 DIAGNOSIS — I890 Lymphedema, not elsewhere classified: Secondary | ICD-10-CM

## 2017-12-30 DIAGNOSIS — R293 Abnormal posture: Secondary | ICD-10-CM | POA: Diagnosis present

## 2017-12-30 DIAGNOSIS — M6281 Muscle weakness (generalized): Secondary | ICD-10-CM | POA: Diagnosis present

## 2017-12-30 DIAGNOSIS — Z483 Aftercare following surgery for neoplasm: Secondary | ICD-10-CM | POA: Insufficient documentation

## 2017-12-30 NOTE — Therapy (Signed)
[place], [place], [address] Phone: [phone]   Fax:  [phone]  Physical Therapy Treatment  Patient Details  Name: Melissa [name] MRN: [id number] Date of Birth: [birth year] Referring Provider (PT): Shona Simpson   Encounter Date: 12/30/2017  PT End of Session - 12/30/17 1259    Visit Number  8    Number of Visits  9    Date for PT Re-Evaluation  12/24/17    PT Start Time  0930    PT Stop Time  1015    PT Time Calculation (min)  45 min    Activity Tolerance  Patient tolerated treatment well    Behavior During Therapy  [facility] for tasks assessed/performed       Past Medical History:  Diagnosis Date  . Anxiety   . Arthritis    shoulders, hands  . Cancer ([place]) 07/2017   right breast cancer  . Chest pain   . Diverticulosis   . GERD (gastroesophageal reflux disease)   . High cholesterol   . IBS (irritable bowel syndrome)   . Kidney disorder    Pt states only has left kidney  . Panic attacks   . Pulmonary embolism ([place]) 2018   pt states resolved and no longer takes blood thinners    Past Surgical History:  Procedure Laterality Date  . BREAST LUMPECTOMY WITH RADIOACTIVE SEED AND SENTINEL LYMPH NODE BIOPSY Right 08/23/2017   Procedure: BREAST LUMPECTOMY WITH RADIOACTIVE SEED AND SENTINEL LYMPH NODE BIOPSY;  Surgeon: Erroll Luna, MD;  Location: [place];  Service: General;  Laterality: Right;  . TUBAL LIGATION      There were no vitals filed for this visit.  Subjective Assessment - 12/30/17 0937    Subjective  Pt reports she is doing well overall, The swelling in her breast and arm comes and goes. 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 Patient underwent a right lumpectomy and sentinel node biopsy (0/5 nodes positive) on 08/23/17.  Radiation was completed 10/18/2017. She was hospitalized in 01/2017 with diverticulitis and also had a pulmonary embolus. She recently lost her husband in 10/2016 due to pancreatic cancer. She reports a history of right shoulder and spine arthritis and hip pain that she did not get taken care of due to taking care of her husband and then being diagnosed herself.     Patient Stated Goals  to get rid of the pain and fullness in her right breast     Currently in Pain?  Yes    Pain Score  1                        [facility] Adult PT Treatment/Exercise - 12/30/17 0001      Exercises   Exercises  Knee/Hip;Other Exercises    Other Exercises   reviewed strength ABC stretches and core work and completed instruction of all other exercises with handout.  Pt was able to demonstrate good form and express understanding.       Knee/Hip Exercises: Sidelying   Hip ABduction  Strengthening;Right;Left;10 reps    Clams  10 reps on each side with occasional cues to keep core engaged  PT Long Term Goals - 12/30/17 1007      PT LONG TERM GOAL #1   Title  Pt will report that the pain in her breast is decreased to 1/10 and has decreased in frequency also.    Baseline  2/10, still has pain in her breast 2/10     Status  Partially Met      PT LONG TERM GOAL #2   Title  Pt will be independent in self manual lymph draianage and use of compression to manage right breast fullness    Status  Achieved      PT LONG TERM GOAL #3   Title  Pt will be independent in home exercise program to increase strength and posture    Status  Achieved            Plan - 12/30/17 0955    Clinical Impression Statement  Pt continues to have some pain in left breast, but feels that may just take a long time to go away.  She feels that she knows how to manage the pain and occasional  swelling and knows how to progress her home exercise.  She feels she is ready to discharge from PT     Rehab Potential  Good    Clinical Impairments Affecting Rehab Potential  radiation completed 10/18/2017    PT Next Visit Plan  Discharge     PT Home Exercise Plan  has already attended ABC class, calculated target heart rates with Heart rate reserve method:  for light exercise 95-103, Moderate exercise: 103-120  Medbridge access [email or web address]       Patient will benefit from skilled therapeutic intervention in order to improve the following deficits and impairments:  Decreased skin integrity, Increased fascial restricitons, Pain, Postural dysfunction, Decreased scar mobility, Increased edema  Visit Diagnosis: Lymphedema, not elsewhere classified  Abnormal posture  Muscle weakness (generalized)  Aftercare following surgery for neoplasm     Problem List Patient Active Problem List   Diagnosis Date Noted  . Pulmonary embolism ([place]) 07/27/2017  . Osteopenia 07/27/2017  . Malignant neoplasm of upper-outer quadrant of right breast in female, estrogen receptor positive ([place]) 07/22/2017  . Pure hypercholesterolemia 07/14/2017  . Palpitations 04/18/2017  . Shortness of breath 04/18/2017  . Atypical chest pain 03/26/2017  . Leukocytosis 03/26/2017  . Infected tooth 03/26/2017  . Panic attacks   . GERD (gastroesophageal reflux disease)   . Diverticulosis   . Anxiety   . Kidney disorder    PHYSICAL THERAPY DISCHARGE SUMMARY  Visits from Start of Care: 8  Current functional level related to goals / functional outcomes: Pt feels she is independent in managing her symptoms    Remaining deficits: Pain and occasional swelling in right breast    Education / Equipment: Manual lymph drainage and use of compression, home exercise Plan: Patient agrees to discharge.  Patient goals were partially met. Patient is being discharged due to being pleased with the current functional level.  ?????    Donato Heinz. Owens Shark PT  [place] 12/30/2017, 1:02 PM  [place], [place], [address] Phone: [phone]   Fax:  [phone]  Name: Melissa [name] MRN: [id number] Date of Birth: 01-Jun-1950

## 2018-01-02 ENCOUNTER — Ambulatory Visit: Payer: Medicare Other | Admitting: Rehabilitation

## 2018-02-28 ENCOUNTER — Other Ambulatory Visit: Payer: Self-pay | Admitting: *Deleted

## 2018-02-28 DIAGNOSIS — Z170 Estrogen receptor positive status [ER+]: Principal | ICD-10-CM

## 2018-02-28 DIAGNOSIS — C50411 Malignant neoplasm of upper-outer quadrant of right female breast: Secondary | ICD-10-CM

## 2018-03-01 NOTE — Progress Notes (Signed)
[place] [place]  [phone] [phone]     ID: Melissa [name] DOB: 1950-05-16  MR#: [id number]  VZD#:[id number]  Patient Care Team: [facility] as PCP - General (Family Medicine) End, Harrell Gave, MD as PCP - Cardiology (Cardiology) Erroll Luna, MD as Consulting Physician (General Surgery) Magrinat, Virgie Dad, MD as Consulting Physician (Oncology) Kyung Rudd, MD as Consulting Physician (Radiation Oncology) Christain Sacramento, MD as Consulting Physician (Family Medicine) Aletha Halim., PA-C as Physician Assistant (Family Medicine) Richmond Campbell, MD as Consulting Physician (Gastroenterology) Delice Bison, Charlestine Massed, NP as Nurse Practitioner (Hematology and Oncology) OTHER MD:    CHIEF COMPLAINT: Estrogen receptor positive breast cancer  CURRENT TREATMENT: Anastrozole   HISTORY OF CURRENT ILLNESS: From the original intake note:  Melissa [name] had routine screening bilateral mammography with tomography at [facility] on 07/12/2017 showing; breast density category C. There was a possible mass in the right breast. On 07/14/2017 she completed unilateral right ultrasonography at [facility] that showed an irregular hypoechoic 1.6 cm mass in the right upper outer quadrant and posterior depth. Evaluation of the right axilla showed 4 lymph nodes with cortical thickening suspicious of malignancy.   Accordingly on 07/19/2017 she proceeded to biopsy of the right breast area and 1 lymph node in question. 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  The lymph node was biopsied showing no evidence of carcinoma, which was found to be concordant.  Prognostic indicators significant for: estrogen receptor, 95% positive and progesterone receptor, 95% positive, both with strong staining intensity. Proliferation marker Ki67 at 10% . HER2 not amplified with  ratios HER2/CEP17 signals 1.22 and average HER2 copies per cell 1.95  The patient's subsequent history is as detailed below.   INTERVAL HISTORY: Melissa [name] returns today for follow-up and treatment of her estrogen receptor positive breast cancer.   She continues on anastrozole. She has noticed that her joints hurt more than usual in her knees, shoulder, fingers, and back; She has arthritis, but the pain has worsened since she began anastrozole. She takes Tylenol to treat, no more than 1000 mg per day, with some relief. Pain improvement with activity. She has some vaginal dryness, with discomfort.   Melissa [name]'s last bone density screening on 05/10/2016 at [facility], showed a T-score of -1.9, which is considered osteopenic.    Since her last visit here, she has not undergone any additional studies.   REVIEW OF SYSTEMS: Melissa [name] tries to walk for exercise at her church; she also stretches. The patient denies unusual headaches, visual changes, nausea, vomiting, or dizziness. There has been no unusual cough, phlegm production, or pleurisy. This been no change in bowel or bladder habits. The patient denies unexplained fatigue or unexplained weight loss, bleeding, rash, or fever. A detailed review of systems was otherwise noncontributory.    PAST MEDICAL HISTORY: Past Medical History:  Diagnosis Date  . Anxiety   . Arthritis    shoulders, hands  . Cancer ([place]) 07/2017   right breast cancer  . Chest pain   . Diverticulosis   . GERD (gastroesophageal reflux disease)   . High cholesterol   . IBS (irritable bowel syndrome)   . Kidney disorder    Pt states only has left kidney  . Panic attacks   . Pulmonary embolism ([place]) 2018   pt states resolved and no longer takes blood thinners  Small pulmonary emboli - eliquis for 6 months- resolved.  Depressive and anxiety  moods - xanax One functioning Kidney   PAST SURGICAL HISTORY: Past Surgical History:  Procedure Laterality Date  . BREAST LUMPECTOMY  WITH RADIOACTIVE SEED AND SENTINEL LYMPH NODE BIOPSY Right 08/23/2017   Procedure: BREAST LUMPECTOMY WITH RADIOACTIVE SEED AND SENTINEL LYMPH NODE BIOPSY;  Surgeon: Erroll Luna, MD;  Location: [place];  Service: General;  Laterality: Right;  . TUBAL LIGATION      FAMILY HISTORY Family History  Problem Relation Age of Onset  . Alzheimer's disease Mother   . Stroke Father 17  . Heart attack Maternal Grandmother   . Heart attack Paternal Grandmother   . Cervical cancer Cousin   . Colon cancer Paternal Aunt   . Colon cancer Cousin    The patient's father died at age 57 due to CHF. The patient's mother died at age 68 due to Alzheimer's. The patient has 1 brother and 4 sisters. There was a paternal 1st cousin diagnosed with cervical cancer at age 58. There was a paternal aunt diagnosed with colon cancer at age 14. There was a paternal 1st cousin diagnosed with colon cancer at age 64. The patient denies a family history of breast or ovarian cancer.     GYNECOLOGIC HISTORY:  No LMP recorded. Patient is postmenopausal. Menarche: 68 years old Age at first live birth: 68 years old She is GX P1. Her LMP was in 2008. She used oral contraception with no complications. She did not used HRT.     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 Her husband, Melissa [name], died in December 12, 2016 due to pancreatic cancer. He was a Furniture conservator/restorer. She lives alone with her 54 year old dog, a Web designer. The patient's daughter, Melissa [name] lives in [place] and works at [employer]. Melissa [name] has two blood grandchildren and two step-grandchildren.   ADVANCED DIRECTIVES: The patient plans to name her daughter, Melissa [name] as her HCPOA. The appropriate forms were given at the 07/27/2017 visit.    HEALTH MAINTENANCE: Social History   Tobacco Use  . Smoking status: Former Smoker    Packs/day: 20.00    Years: 1.00    Pack years: 20.00    Types: Cigarettes     Last attempt to quit: 1985    Years since quitting: 35.1  . Smokeless tobacco: Never Used  Substance Use Topics  . Alcohol use: Yes    Frequency: Never    Comment: social  . Drug use: Never    Colonoscopy: Feb. 2019/ [facility]/ diverticulosis  PAP: Nov. 2018  Bone density: Nov. 2018 / osteopenia   Allergies  Allergen Reactions  . Clindamycin Diarrhea and Nausea Only    Current Outpatient Medications  Medication Sig Dispense Refill  . acetaminophen (TYLENOL) 500 MG tablet Take 1,000 mg by mouth every 6 (six) hours as needed for mild pain.    Marland Kitchen ALPRAZolam (XANAX) 0.25 MG tablet 3 (three) times daily. TAKE 1/2 TABLET IN THE MORNING AND 1/2 IN THE EVENING AT BEDTIME    . anastrozole (ARIMIDEX) 1 MG tablet Take 1 tablet (1 mg total) by mouth daily. 90 tablet 12  . cholecalciferol 2000 units tablet Take 0.5 tablets (1,000 Units total) by mouth daily.    . hydrocortisone cream 1 % Apply 1 application topically as needed for itching.    . hyoscyamine (ANASPAZ) 0.125 MG TBDP disintergrating tablet Take 0.125 mg by mouth every 6 (six) hours as needed.  3  . omeprazole (PRILOSEC) 20 MG capsule Take 20 mg by  mouth 2 (two) times daily before a meal.     No current facility-administered medications for this visit.     OBJECTIVE: Middle-aged white woman in no acute distress  Vitals:   03/02/18 1015  BP: 133/69  Pulse: 82  Resp: 18  Temp: 98 F (36.7 C)  SpO2: 98%     Body mass index is 29.3 kg/m.   Wt Readings from Last 3 Encounters:  03/02/18 170 lb 11.2 oz (77.4 kg)  12/29/17 171 lb 9.6 oz (77.8 kg)  11/23/17 173 lb (78.5 kg)      ECOG FS:1 - Symptomatic but completely ambulatory  Sclerae unicteric, EOMs intact No cervical or supraclavicular adenopathy Lungs no rales or rhonchi Heart regular rate and rhythm Abd soft, nontender, positive bowel sounds MSK no focal spinal tenderness, no upper extremity lymphedema Neuro: nonfocal, well oriented, appropriate affect Breasts:  Right breast is status post lumpectomy and radiation.  There is the expected skin coarsening, but the overall cosmetic effect is good and there is nothing to suggest disease recurrence.  The left breast is benign.  Both axillae are benign.  LAB RESULTS:  CMP     Component Value Date/Time   NA 141 03/02/2018 0947   K 3.7 03/02/2018 0947   CL 105 03/02/2018 0947   CO2 27 03/02/2018 0947   GLUCOSE 97 03/02/2018 0947   BUN 13 03/02/2018 0947   CREATININE 0.79 03/02/2018 0947   CALCIUM 9.6 03/02/2018 0947   PROT 7.4 03/02/2018 0947   ALBUMIN 4.1 03/02/2018 0947   AST 24 03/02/2018 0947   ALT 26 03/02/2018 0947   ALKPHOS 76 03/02/2018 0947   BILITOT 0.7 03/02/2018 0947   GFRNONAA >60 03/02/2018 0947   GFRAA >60 03/02/2018 0947    No results found for: TOTALPROTELP, ALBUMINELP, A1GS, A2GS, BETS, BETA2SER, GAMS, MSPIKE, SPEI  No results found for: Nils Pyle, [facility]  Lab Results  Component Value Date   WBC 6.0 03/02/2018   NEUTROABS 3.3 03/02/2018   HGB 13.5 03/02/2018   HCT 41.4 03/02/2018   MCV 96.7 03/02/2018   PLT 267 03/02/2018    _0 @  No results found for: LABCA2  No components found for: STMHDQ222  No results for input(s): INR in the last 168 hours.  No results found for: LABCA2  No results found for: LNL892  No results found for: JJH417  No results found for: EYC144  No results found for: CA2729  No components found for: HGQUANT  No results found for: CEA1 / No results found for: CEA1   No results found for: AFPTUMOR  No results found for: CHROMOGRNA  No results found for: PSA1  Appointment on 03/02/2018  Component Date Value Ref Range Status  . Sodium 03/02/2018 141  135 - 145 mmol/L Final  . Potassium 03/02/2018 3.7  3.5 - 5.1 mmol/L Final  . Chloride 03/02/2018 105  98 - 111 mmol/L Final  . CO2 03/02/2018 27  22 - 32 mmol/L Final  . Glucose, Bld 03/02/2018 97  70 - 99 mg/dL Final  . BUN 03/02/2018 13  8 - 23  mg/dL Final  . Creatinine 03/02/2018 0.79  0.44 - 1.00 mg/dL Final  . Calcium 03/02/2018 9.6  8.9 - 10.3 mg/dL Final  . Total Protein 03/02/2018 7.4  6.5 - 8.1 g/dL Final  . Albumin 03/02/2018 4.1  3.5 - 5.0 g/dL Final  . AST 03/02/2018 24  15 - 41 U/L Final  . ALT 03/02/2018 26  0 - 44 U/L Final  .  Alkaline Phosphatase 03/02/2018 76  38 - 126 U/L Final  . Total Bilirubin 03/02/2018 0.7  0.3 - 1.2 mg/dL Final  . GFR, Est Non Af Am 03/02/2018 >60  >60 mL/min Final  . GFR, Est AFR Am 03/02/2018 >60  >60 mL/min Final  . Anion gap 03/02/2018 9  5 - 15 Final   Performed at [facility], [place] [address] [place], [place] [address]  . WBC Count 03/02/2018 6.0  4.0 - 10.5 K/uL Final  . RBC 03/02/2018 4.28  3.87 - 5.11 MIL/uL Final  . Hemoglobin 03/02/2018 13.5  12.0 - 15.0 g/dL Final  . HCT 03/02/2018 41.4  36.0 - 46.0 % Final  . MCV 03/02/2018 96.7  80.0 - 100.0 fL Final  . MCH 03/02/2018 31.5  26.0 - 34.0 pg Final  . MCHC 03/02/2018 32.6  30.0 - 36.0 g/dL Final  . RDW 03/02/2018 12.2  11.5 - 15.5 % Final  . Platelet Count 03/02/2018 267  150 - 400 K/uL Final  . nRBC 03/02/2018 0.0  0.0 - 0.2 % Final  . Neutrophils Relative % 03/02/2018 56  % Final  . Neutro Abs 03/02/2018 3.3  1.7 - 7.7 K/uL Final  . Lymphocytes Relative 03/02/2018 29  % Final  . Lymphs Abs 03/02/2018 1.8  0.7 - 4.0 K/uL Final  . Monocytes Relative 03/02/2018 12  % Final  . Monocytes Absolute 03/02/2018 0.7  0.1 - 1.0 K/uL Final  . Eosinophils Relative 03/02/2018 2  % Final  . Eosinophils Absolute 03/02/2018 0.1  0.0 - 0.5 K/uL Final  . Basophils Relative 03/02/2018 1  % Final  . Basophils Absolute 03/02/2018 0.0  0.0 - 0.1 K/uL Final  . Immature Granulocytes 03/02/2018 0  % Final  . Abs Immature Granulocytes 03/02/2018 0.01  0.00 - 0.07 K/uL Final   Performed at [name], [address] [place], [place] [address]    (this displays the last labs from the last 3  days)  No results found for: TOTALPROTELP, ALBUMINELP, A1GS, A2GS, BETS, BETA2SER, GAMS, MSPIKE, SPEI (this displays SPEP labs)  No results found for: KPAFRELGTCHN, LAMBDASER, KAPLAMBRATIO (kappa/lambda light chains)  No results found for: HGBA, HGBA2QUANT, HGBFQUANT, HGBSQUAN (Hemoglobinopathy evaluation)   No results found for: LDH  No results found for: IRON, TIBC, IRONPCTSAT (Iron and TIBC)  No results found for: FERRITIN  Urinalysis    Component Value Date/Time   COLORURINE STRAW (A) 03/26/2017 [place] 03/26/2017 0724   LABSPEC 1.012 03/26/2017 0724   PHURINE 7.0 03/26/2017 0724   GLUCOSEU NEGATIVE 03/26/2017 0724   HGBUR SMALL (A) 03/26/2017 0724   BILIRUBINUR NEGATIVE 03/26/2017 0724   KETONESUR NEGATIVE 03/26/2017 0724   PROTEINUR NEGATIVE 03/26/2017 0724   NITRITE NEGATIVE 03/26/2017 0724   LEUKOCYTESUR NEGATIVE 03/26/2017 0724    STUDIES: Repeat mammography will be due June 2020; DEXA scan will be due at the same time  ELIGIBLE FOR AVAILABLE RESEARCH PROTOCOL: no   ASSESSMENT: 68 y.o. [place], [place] woman status post right breast upper outer quadrant biopsy 07/19/2017 for a clinical T1c NX, stage IA invasive ductal carcinoma, grade 2, estrogen and progesterone receptor positive, HER-2 not amplified, with an MIB-1 of 10%  (a) 1 of 4 suspicious axillary lymph nodes biopsied 07/19/2017 was negative  (1) status post right lumpectomy and sentinel lymph node sampling 08/23/2017 for a pT1c pN0, stage IA invasive ductal carcinoma, grade 2, with negative margins  (a) total of 5 sentinel lymph  nodes removed  (2) The Oncotype DX score was 12, predicting a risk of outside the breast recurrence over the next 9 years of 3% if the patient's only systemic therapy is tamoxifen for 5 years.  It also predicts no significant benefit from chemotherapy.  (3) adjuvant radiation completed 09/27/2017 - 10/24/2017  Site/dose: The patient initially received a dose of  42.56 Gy in 16 fractions to the breast using whole-breast tangent fields. This was delivered using a 3-D conformal technique. The patient then received a boost to the seroma. This delivered an additional 8 Gy in 4 fractions using a 3 field photon technique due to the depth of the seroma. The total dose was 50.56 Gy.  (4) anastrozole started 11/25/2017  (a) density 05/10/2016 showed a T score of -1.9  (b) repeat DEXA scan June 2020  (5) history of acute right lower lobe pulmonary embolus 03/19/2016; bilateral Dopplers lower extremity same day were clear  (a) status post apixaban x6 months   PLAN: Tesia is now 8 months out from definitive surgery for her breast cancer.  There is no evidence of residual or recurrent disease.  She is tolerating anastrozole well.  She is having perhaps some slight increase in arthralgias and myalgias but this is not very clear since she did have those at baseline as well.  It is important that the symptoms improve with activity.  The problem in the right shoulder is partly due to likely bursitis and partly due to her axillary node sampling.  She did have physical rehab for that.  She might benefit from an orthopedic evaluation at some point if the symptoms do not improve  The plan at this point is for anastrozole for 5 years.  She will have a DEXA scan in June together with her mammography.  She will see me in July.  She tells me she is already scheduled to see Dr. Brantley Stage later this month.  If she sees Dr. Brantley Stage a year later, February or January 2021, she can continue to see me in July or August and that would be optimal  I placed a referral to our pelvic rehabilitation program.  She also has an appointment for evaluation with Dr. Salley Scarlet I believe in May.  She knows to call for any other issues that may develop before the next visit.    , Virgie Dad, MD  03/02/18 10:56 AM Medical Oncology and Hematology [facility] [address] [place], [place] [address] Tel. [phone]    Fax. [phone]  I, Jacqualyn Posey am acting as a Education administrator for Chauncey Cruel, MD.   I, Lurline Del MD, have reviewed the above documentation for accuracy and completeness, and I agree with the above.

## 2018-03-02 ENCOUNTER — Telehealth: Payer: Self-pay | Admitting: Oncology

## 2018-03-02 ENCOUNTER — Inpatient Hospital Stay: Payer: Medicare Other | Admitting: Oncology

## 2018-03-02 ENCOUNTER — Inpatient Hospital Stay: Payer: Medicare Other | Attending: Oncology

## 2018-03-02 VITALS — BP 133/69 | HR 82 | Temp 98.0°F | Resp 18 | Ht 64.0 in | Wt 170.7 lb

## 2018-03-02 DIAGNOSIS — Z800 Family history of malignant neoplasm of digestive organs: Secondary | ICD-10-CM | POA: Diagnosis not present

## 2018-03-02 DIAGNOSIS — C50411 Malignant neoplasm of upper-outer quadrant of right female breast: Secondary | ICD-10-CM

## 2018-03-02 DIAGNOSIS — Z79811 Long term (current) use of aromatase inhibitors: Secondary | ICD-10-CM | POA: Insufficient documentation

## 2018-03-02 DIAGNOSIS — Z923 Personal history of irradiation: Secondary | ICD-10-CM | POA: Diagnosis not present

## 2018-03-02 DIAGNOSIS — Z87891 Personal history of nicotine dependence: Secondary | ICD-10-CM

## 2018-03-02 DIAGNOSIS — Z86711 Personal history of pulmonary embolism: Secondary | ICD-10-CM | POA: Insufficient documentation

## 2018-03-02 DIAGNOSIS — M8580 Other specified disorders of bone density and structure, unspecified site: Secondary | ICD-10-CM | POA: Diagnosis not present

## 2018-03-02 DIAGNOSIS — Z8049 Family history of malignant neoplasm of other genital organs: Secondary | ICD-10-CM | POA: Diagnosis not present

## 2018-03-02 DIAGNOSIS — Z79899 Other long term (current) drug therapy: Secondary | ICD-10-CM

## 2018-03-02 DIAGNOSIS — Z170 Estrogen receptor positive status [ER+]: Secondary | ICD-10-CM | POA: Insufficient documentation

## 2018-03-02 LAB — CMP (CANCER CENTER ONLY)
ALT: 26 U/L (ref 0–44)
ANION GAP: 9 (ref 5–15)
AST: 24 U/L (ref 15–41)
Albumin: 4.1 g/dL (ref 3.5–5.0)
Alkaline Phosphatase: 76 U/L (ref 38–126)
BILIRUBIN TOTAL: 0.7 mg/dL (ref 0.3–1.2)
BUN: 13 mg/dL (ref 8–23)
CALCIUM: 9.6 mg/dL (ref 8.9–10.3)
CO2: 27 mmol/L (ref 22–32)
Chloride: 105 mmol/L (ref 98–111)
Creatinine: 0.79 mg/dL (ref 0.44–1.00)
Glucose, Bld: 97 mg/dL (ref 70–99)
Potassium: 3.7 mmol/L (ref 3.5–5.1)
Sodium: 141 mmol/L (ref 135–145)
TOTAL PROTEIN: 7.4 g/dL (ref 6.5–8.1)

## 2018-03-02 LAB — CBC WITH DIFFERENTIAL (CANCER CENTER ONLY)
Abs Immature Granulocytes: 0.01 10*3/uL (ref 0.00–0.07)
BASOS PCT: 1 %
Basophils Absolute: 0 10*3/uL (ref 0.0–0.1)
EOS PCT: 2 %
Eosinophils Absolute: 0.1 10*3/uL (ref 0.0–0.5)
HCT: 41.4 % (ref 36.0–46.0)
Hemoglobin: 13.5 g/dL (ref 12.0–15.0)
Immature Granulocytes: 0 %
Lymphocytes Relative: 29 %
Lymphs Abs: 1.8 10*3/uL (ref 0.7–4.0)
MCH: 31.5 pg (ref 26.0–34.0)
MCHC: 32.6 g/dL (ref 30.0–36.0)
MCV: 96.7 fL (ref 80.0–100.0)
MONO ABS: 0.7 10*3/uL (ref 0.1–1.0)
Monocytes Relative: 12 %
Neutro Abs: 3.3 10*3/uL (ref 1.7–7.7)
Neutrophils Relative %: 56 %
PLATELETS: 267 10*3/uL (ref 150–400)
RBC: 4.28 MIL/uL (ref 3.87–5.11)
RDW: 12.2 % (ref 11.5–15.5)
WBC: 6 10*3/uL (ref 4.0–10.5)
nRBC: 0 % (ref 0.0–0.2)

## 2018-03-02 NOTE — Telephone Encounter (Signed)
Gave avs and calendar ° °

## 2018-03-09 ENCOUNTER — Ambulatory Visit: Payer: Medicare Other | Attending: Surgery | Admitting: Physical Therapy

## 2018-03-09 ENCOUNTER — Other Ambulatory Visit: Payer: Self-pay

## 2018-03-09 DIAGNOSIS — R293 Abnormal posture: Secondary | ICD-10-CM

## 2018-03-09 DIAGNOSIS — R252 Cramp and spasm: Secondary | ICD-10-CM

## 2018-03-09 DIAGNOSIS — M6281 Muscle weakness (generalized): Secondary | ICD-10-CM | POA: Diagnosis not present

## 2018-03-09 NOTE — Patient Instructions (Addendum)
Moisturizers . They are used in the vagina to hydrate the mucous membrane that make up the vaginal canal. . Designed to keep a more normal acid balance (ph) . Once placed in the vagina, it will last between two to three days.  . Use 2-3 times per week at bedtime and last longer than 60 min. . Ingredients to avoid is glycerin and fragrance, can increase chance of infection . Should not be used just before sex due to causing irritation . Most are gels administered either in a tampon-shaped applicator or as a vaginal suppository. They are non-hormonal.   Types of Moisturizers . Samul Dada- drug store . Vitamin E vaginal suppositories- Whole foods, Amazon . Moist Again . Coconut oil- can break down condoms . Julva- (Do no use if on Tamoxifen) amazon . Yes moisturizer- amazon . NeuEve Silk , NeuEve Silver for menopausal or over 65 (if have severe vaginal atrophy or cancer treatments use NeuEve Silk for  1 month than move to The Pepsi)- [employer], MapleFlower.dk . Olive and Bee intimate cream- www.oliveandbee.com.au . Mae vaginal moisturizer- Amazon  Creams to use externally on the Vulva area  Albertson's (good for for cancer patients that had radiation to the area)- [place] or [employer].FlyingBasics.com.br  V-magic cream - amazon  Julva-amazon  Vital "V Wild Yam salve ( help moisturize and help with thinning vulvar area, does have [place] by Damiva labial moisturizer ([place],    Things to avoid in the vaginal area . Do not use things to irritate the vulvar area . No lotions just specialized creams for the vulva area- Neogyn, V-magic, No soaps; can use Aveeno or Calendula cleanser if needed. Must be gentle . No deodorants . No douches . Good to sleep without underwear to let the vaginal area to air out . No scrubbing: spread the lips to let warm water rinse over labias and pat dry   STRETCHING THE  PELVIC FLOOR MUSCLES NO DILATOR  Supplies . Vaginal lubricant . Mirror (optional) . Gloves (optional) Positioning . Start in a semi-reclined position with your head propped up. Bend your knees and place your thumb or finger at the vaginal opening. Procedure . Apply a moderate amount of lubricant on the outer skin of your vagina, the labia minora.  Apply additional lubricant to your finger. Marland Kitchen Spread the skin away from the vaginal opening. Place the end of your finger at the opening. . Do a maximum contraction of the pelvic floor muscles. Tighten the vagina and the anus maximally and relax. . When you know they are relaxed, gently and slowly insert your finger into your vagina, directing your finger slightly downward, for 2-3 inches of insertion. . Relax and stretch the 6 o'clock position . Hold each stretch for _2 min__ and repeat __1_ time with rest breaks of _1__ seconds between each stretch. . Repeat the stretching in the 4 o'clock and 8 o'clock positions. . Total time should be _6__ minutes, _1__ x per day.  Note the amount of theme your were able to achieve and your tolerance to your finger in your vagina. . Once you have accomplished the techniques you may try them in standing with one foot resting on the tub, or in other positions.  This is a good stretch to do in the shower if you don't need to use lubricant.  Menopause Fact Sheet: Possible things to be aware of during our menopausal years ?  Osteoporosis Risk is higher after menopause 1 in 2 women and 1 in 5 men have osteoporosis in [place] and [place] is closing the gap (possibly due to more adopting more Western lifestyle?) Strategies to prevent: eat green leafy vegetables, fish, seaweeds, fermented soy, fermented foods, and stay hydrated; get exercise; get adequate sleep Avoid: sodas (caffeine, non-caffeine, diet, or regular) - studies show increased risk for hip fracture  ? SUI (stress urinary incontinence)  Risk factors: obesity,  lack of exercise, constipation, hypertension Half of women ages 30-90 experience leakage monthly  ? LUTS (lower urinary tract symptoms)  Regular sex decreases LUTS These symptoms are 3-6x more likely in women not sexually active verses women who are  ? Sexual Pain Possible causes: post surgery (such as hysterectomy, pelvic floor muscle dysfunction, muscle and skeletal dysfunction of low back, trunk, hips, vaginal dryness, previous trauma, anticipation of pain can cause pain  ? Estrogen Almost every cell in the human body has estrogen receptors Tissues hold more water when estrogen present Low estrogen can deplete good bacteria causing pH issues in the vagina Low estrogen can cause tissues and muscle to atrophy Lower estrogen can occur in Menopause also post-partum Estrogen dominance causes constipation Xenoestrogens: come from environment and are similar to estrogens but have negative effect on the body include artificial fragrances, DDT, PCB, plastics (things like Teflon pans have some of these) Phytoestrogens: plant based estrogens such as soy, fermented soy, lentils, and chickpeas  ? Testosterone Estrogen is not the only hormone that decreases - women SOMETIMES lose testosterone too (sometimes there is an excess)  Testosterone may be low if there is low sex drive, decrease in muscle mass, decrease in bone density, depression, achy joints, chronic fatigue, dry skin, incontinence  Things that help increase testosterone: Exercise and eat foods with Zinc (oysters, crab, lean beef and poultry, dairy (yogurt, cheese), nuts, beans, chickpeas, cashews, almonds   ? Bowel function Fecal incontinence more prevalent in menopausal women Increased risk in women who have experienced complicated deliveries (both vaginal and c-section) and other risk factors: urinary incontinence, obesity, smoking, chronic diarrhea, IBS, cholecystectomy, anal penetrative intercourse Stress: can cause nausea,  vomiting, abdominal pain, bowel issues ? Pelvic organ prolapse (POP) With or without mesh, conservative treatment is recommended first! Systematic review of the recent literature VanGeelen and Dwyer 'Conservative treatment is the first option in women with POP.  ? Posture Improved posture is better for joints and overall function.  Study shows that upright posture improves testosterone production in men.  Posture also shown to improve mood and self-confidence across all genders.   Menopause is a NORMAL process; NOT a disease process Studies show in cultures that view aging as something to be revered, there are much fewer if any negative symptoms experienced during menopause! Big picture is that symptoms are reduced with greater overall health: Hydration, sleep, take time for deep breathing and having fun, practice things that bring joy and gratitude, exercise and move in ways that feel good for your body

## 2018-03-12 ENCOUNTER — Encounter: Payer: Self-pay | Admitting: Physical Therapy

## 2018-03-12 NOTE — Therapy (Signed)
[facility] [address], [place], [place], [address] Phone: [phone]   Fax:  [phone]  Physical Therapy Evaluation  Patient Details  Name: Melissa [name] MRN: [id number] Date of Birth: 12-28-1950 Referring Provider (PT): Magrinat, Virgie Dad, MD   Encounter Date: 03/09/2018  PT End of Session - 03/12/18 1822    Visit Number  1    Number of Visits  10    Date for PT Re-Evaluation  04/20/18    PT Start Time  1146    PT Stop Time  1226    PT Time Calculation (min)  40 min    Activity Tolerance  Patient tolerated treatment well    Behavior During Therapy  [facility] for tasks assessed/performed       Past Medical History:  Diagnosis Date  . Anxiety   . Arthritis    shoulders, hands  . Cancer ([place]) 07/2017   right breast cancer  . Chest pain   . Diverticulosis   . GERD (gastroesophageal reflux disease)   . High cholesterol   . IBS (irritable bowel syndrome)   . Kidney disorder    Pt states only has left kidney  . Panic attacks   . Pulmonary embolism ([place]) 2018   pt states resolved and no longer takes blood thinners    Past Surgical History:  Procedure Laterality Date  . BREAST LUMPECTOMY WITH RADIOACTIVE SEED AND SENTINEL LYMPH NODE BIOPSY Right 08/23/2017   Procedure: BREAST LUMPECTOMY WITH RADIOACTIVE SEED AND SENTINEL LYMPH NODE BIOPSY;  Surgeon: Erroll Luna, MD;  Location: [facility];  Service: General;  Laterality: Right;  . TUBAL LIGATION      There were no vitals filed for this visit.   Subjective Assessment - 03/12/18 1842    Subjective  Pt states she is walking and just has burning that comes and goes depending on the day. Some leakage with a strong urge.  Sometimes going frequently    Pertinent History  hip arthritis (Rt), one kidney, Breast cancer treatments radiation ended 10/24/17    Patient Stated Goals  Get rid of the burning pain    Currently in Pain?  Yes    Pain Score   2     Pain Location  Vagina    Pain Orientation  Mid    Pain Descriptors / Indicators  Burning    Pain Type  Acute pain    Pain Onset  1 to 4 weeks ago    Aggravating Factors   any time    Pain Relieving Factors  not sure    Multiple Pain Sites  No         [facility] PT Assessment - 03/12/18 0001      Assessment   Medical Diagnosis  C50.411,Z17.0 (ICD-10-CM) - Malignant neoplasm of upper-outer quadrant of right breast in female, estrogen receptor positive [facility])    Referring Provider (PT)  Magrinat, Virgie Dad, MD    Onset Date/Surgical Date  10/24/17    Prior Therapy  No      Precautions   Precautions  None      Restrictions   Weight Bearing Restrictions  No      Balance Screen   Has the patient fallen in the past 6 months  No      [place] residence    Living Arrangements  Alone   widowed Oct 2018   Available Help at Discharge  --  Prior Function   Level of Independence  Independent    Leisure  She has been walking 30 minutes 2x/week      Cognition   Overall Cognitive Status  Within Functional Limits for tasks assessed      Posture/Postural Control   Posture/Postural Control  Postural limitations    Postural Limitations  Rounded Shoulders      ROM / Strength   AROM / PROM / Strength  Strength;PROM      PROM   Overall PROM Comments  hip flexion 10% limited; all other WFL - pt has Rt hip osteoarthritis       Strength   Overall Strength Comments  hip adduction and abduction 4/5 bil      Flexibility   Soft Tissue Assessment /Muscle Length  yes    Hamstrings  80% bil      Ambulation/Gait   Gait Pattern  Within Functional Limits                Objective measurements completed on examination: See above findings.    Pelvic Floor Special Questions - 03/12/18 0001    Prior Pelvic/Prostate Exam  Yes    Are you Pregnant or attempting pregnancy?  Yes    Number of Pregnancies  1    Number of Vaginal Deliveries  1     Urinary Leakage  Yes    How often  2x/ week    Activities that cause leaking  With strong urge;Coughing;Sneezing    Urinary urgency  --   putting off going   Urinary frequency  2x/ month    Fecal incontinence  No   IBS   Falling out feeling (prolapse)  No    Skin Integrity  Intact   dryness and increased redness of labia   Pelvic Floor Internal Exam  pt identity confirmed and informed consent given to perform internal assessment and treatment as needed    Exam Type  Vaginal    Strength  weak squeeze, no lift   good lift, but has weak squeeze   Strength # of seconds  2    Tone  higher in posterior pelvic floor and lower anteriorly       [facility] Adult PT Treatment/Exercise - 03/12/18 0001      Self-Care   Other Self-Care Comments   reviewed and educated moisturizing techniques as well as self stretch pelvic floor with no dilator                  PT Long Term Goals - 03/12/18 1833      PT LONG TERM GOAL #1   Title  Pt will report that the pain in perineum decreases by 75% due to improved self care techniques    Time  6    Period  Weeks    Status  New    Target Date  04/20/18      PT LONG TERM GOAL #2   Title  Pt will be independent in self massage to improved soft tissue length of pelvic floor    Time  6    Period  Weeks    Status  New    Target Date  04/20/18      PT LONG TERM GOAL #3   Title  Pt will be independent in home exercise program to increase strength and posture    Time  6    Period  Weeks    Status  New    Target Date  04/20/18  PT LONG TERM GOAL #4   Title  Pt will report 50% less leakage and urgency due to improved strength and tone in pelvic floor muscles    Time  6    Period  Weeks    Status  New    Target Date  04/20/18             Plan - 03/12/18 1835    Clinical Impression Statement  Pt presents to clinic due to vaginal pain and dryness as well as urinary urgency and leakage.  Pt has had estrogen receptor positive  breast CA and is now on medicine reducing estrogen levels. Pt has some general weakness and weakness in pelvic floor strength.  Pt has posture abnormalities as mentioned above.  She also has muslce spasms in levator ani muscles.  she will benefit from skilled PT in order to address impairments as mentioned above as well as educate and address impairments related to lower estrogen levels so to reduce risk of other side effects.    History and Personal Factors relevant to plan of care:  widowed appx 1 years ago; history of estrogen receptor pos breast CA    Rehab Potential  Good    PT Treatment/Interventions  ADLs/Self Care Home Management;Therapeutic exercise;Patient/family education;Therapeutic activities;Manual techniques;Passive range of motion;Taping;Biofeedback;Neuromuscular re-education;Dry needling    PT Next Visit Plan  biofeedback, fu on initial  HEP    Consulted and Agree with Plan of Care  Patient       Patient will benefit from skilled therapeutic intervention in order to improve the following deficits and impairments:  Decreased skin integrity, Increased fascial restricitons, Pain, Postural dysfunction, Decreased scar mobility, Increased edema, Decreased strength, Decreased coordination  Visit Diagnosis: Muscle weakness (generalized)  Abnormal posture  Cramp and spasm     Problem List Patient Active Problem List   Diagnosis Date Noted  . Pulmonary embolism ([place]) 07/27/2017  . Osteopenia 07/27/2017  . Malignant neoplasm of upper-outer quadrant of right breast in female, estrogen receptor positive ([place]) 07/22/2017  . Pure hypercholesterolemia 07/14/2017  . Palpitations 04/18/2017  . Shortness of breath 04/18/2017  . Atypical chest pain 03/26/2017  . Leukocytosis 03/26/2017  . Infected tooth 03/26/2017  . Panic attacks   . GERD (gastroesophageal reflux disease)   . Diverticulosis   . Anxiety   . Kidney disorder     Zannie Cove, PT 03/12/2018, 6:42 PM  [facility] [address] [place], [place], [address] Phone: [phone]   Fax:  [phone]  Name: Desi Carby MRN: [id number] Date of Birth: 1950-02-21

## 2018-03-13 ENCOUNTER — Ambulatory Visit: Payer: Medicare Other | Admitting: Physical Therapy

## 2018-03-20 ENCOUNTER — Encounter: Payer: Self-pay | Admitting: Physical Therapy

## 2018-03-20 ENCOUNTER — Ambulatory Visit: Payer: Medicare Other | Admitting: Physical Therapy

## 2018-03-20 DIAGNOSIS — R293 Abnormal posture: Secondary | ICD-10-CM

## 2018-03-20 DIAGNOSIS — M6281 Muscle weakness (generalized): Secondary | ICD-10-CM

## 2018-03-20 DIAGNOSIS — R252 Cramp and spasm: Secondary | ICD-10-CM

## 2018-03-20 NOTE — Therapy (Addendum)
[facility] [address], [place], [place], [address] Phone: [phone]   Fax:  [phone]  Physical Therapy Treatment  Patient Details  Name: Melissa [name] MRN: [id number] Date of Birth: 1951-01-24 Referring Provider (PT): Magrinat, Virgie Dad, MD   Encounter Date: 03/20/2018  PT End of Session - 03/20/18 0940    Visit Number  2    Number of Visits  10    Date for PT Re-Evaluation  04/20/18    PT Start Time  0932    PT Stop Time  1011    PT Time Calculation (min)  39 min    Activity Tolerance  Patient tolerated treatment well    Behavior During Therapy  [facility] for tasks assessed/performed       Past Medical History:  Diagnosis Date  . Anxiety   . Arthritis    shoulders, hands  . Cancer ([place]) 07/2017   right breast cancer  . Chest pain   . Diverticulosis   . GERD (gastroesophageal reflux disease)   . High cholesterol   . IBS (irritable bowel syndrome)   . Kidney disorder    Pt states only has left kidney  . Panic attacks   . Pulmonary embolism ([place]) 2018   pt states resolved and no longer takes blood thinners    Past Surgical History:  Procedure Laterality Date  . BREAST LUMPECTOMY WITH RADIOACTIVE SEED AND SENTINEL LYMPH NODE BIOPSY Right 08/23/2017   Procedure: BREAST LUMPECTOMY WITH RADIOACTIVE SEED AND SENTINEL LYMPH NODE BIOPSY;  Surgeon: Erroll Luna, MD;  Location: [facility];  Service: General;  Laterality: Right;  . TUBAL LIGATION      There were no vitals filed for this visit.  Subjective Assessment - 03/20/18 0936    Subjective  Pt has not had time to do the moisturizing yet.    Patient Stated Goals  Get rid of the burning pain    Currently in Pain?  Yes    Pain Score  5     Pain Location  Vagina    Pain Orientation  Mid    Pain Descriptors / Indicators  Burning    Pain Type  Other (Comment)    Pain Onset  More than a month ago    Pain Frequency  Intermittent     Multiple Pain Sites  No                       [facility] Adult PT Treatment/Exercise - 03/20/18 0001      Self-Care   Other Self-Care Comments   reviewed moisturizing and gave      Neuro Re-ed    Neuro Re-ed Details   pelvic biofeedback - able to hold 4 sec max; 3 contract and relax then 10 second break             PT Education - 03/20/18 1107    Education Details   Access Code: [email or web address] and urge to void    Person(s) Educated  Patient    Methods  Explanation;Demonstration;Handout;Verbal cues    Comprehension  Verbalized understanding;Returned demonstration          PT Long Term Goals - 03/12/18 1833      PT LONG TERM GOAL #1   Title  Pt will report that the pain in perineum decreases by 75% due to improved self care techniques    Time  6    Period  Weeks    Status  New    Target Date  04/20/18      PT LONG TERM GOAL #2   Title  Pt will be independent in self massage to improved soft tissue length of pelvic floor    Time  6    Period  Weeks    Status  New    Target Date  04/20/18      PT LONG TERM GOAL #3   Title  Pt will be independent in home exercise program to increase strength and posture    Time  6    Period  Weeks    Status  New    Target Date  04/20/18      PT LONG TERM GOAL #4   Title  Pt will report 50% less leakage and urgency due to improved strength and tone in pelvic floor muscles    Time  6    Period  Weeks    Status  New    Target Date  04/20/18            Plan - 03/20/18 1345    Clinical Impression Statement  Pt was given moisturizer to use after treatment today so she can try it out.  She needed some review of moisturizers due to feeling overwhelmed with the options.  Pt did well with biofeedback and was able to demonstrate contract and relax.  She had a hard time with holding contraction >4 seconds . She was educated on adding kegels to HEP and using urge to void techniques.  Pt will benefit from skilled PT to continue  progressing strength and endurance of pelvic floor.    PT Treatment/Interventions  ADLs/Self Care Home Management;Therapeutic exercise;Patient/family education;Therapeutic activities;Manual techniques;Passive range of motion;Taping;Biofeedback;Neuromuscular re-education;Dry needling    PT Next Visit Plan  biofeedback, fu on initial added HEP and moisturizers    Consulted and Agree with Plan of Care  Patient       Patient will benefit from skilled therapeutic intervention in order to improve the following deficits and impairments:  Decreased skin integrity, Increased fascial restricitons, Pain, Postural dysfunction, Decreased scar mobility, Increased edema, Decreased strength, Decreased coordination  Visit Diagnosis: Muscle weakness (generalized)  Abnormal posture  Cramp and spasm     Problem List Patient Active Problem List   Diagnosis Date Noted  . Pulmonary embolism ([place]) 07/27/2017  . Osteopenia 07/27/2017  . Malignant neoplasm of upper-outer quadrant of right breast in female, estrogen receptor positive ([place]) 07/22/2017  . Pure hypercholesterolemia 07/14/2017  . Palpitations 04/18/2017  . Shortness of breath 04/18/2017  . Atypical chest pain 03/26/2017  . Leukocytosis 03/26/2017  . Infected tooth 03/26/2017  . Panic attacks   . GERD (gastroesophageal reflux disease)   . Diverticulosis   . Anxiety   . Kidney disorder     Zannie Cove, PT 03/20/2018, 1:50 PM  [facility] [address] [place], [place], [address] Phone: [phone]   Fax:  [phone]  Name: Melissa [name] MRN: [id number] Date of Birth: 08-20-1950  PHYSICAL THERAPY DISCHARGE SUMMARY  Visits from Start of Care: 2  Current functional level related to goals / functional outcomes: See above goals   Remaining deficits: See above   Education / Equipment: HEP  Plan: Patient agrees to discharge.  Patient goals were not met. Patient is  being discharged due to being pleased with the current functional level.  ?????    Google, PT 03/28/18 2:11 PM

## 2018-03-20 NOTE — Patient Instructions (Addendum)

## 2018-03-28 ENCOUNTER — Encounter: Payer: Medicare Other | Admitting: Physical Therapy

## 2018-04-04 ENCOUNTER — Encounter: Payer: Medicare Other | Admitting: Physical Therapy

## 2018-04-11 ENCOUNTER — Encounter: Payer: Medicare Other | Admitting: Physical Therapy

## 2018-04-18 ENCOUNTER — Encounter: Payer: Medicare Other | Admitting: Physical Therapy

## 2018-06-16 ENCOUNTER — Other Ambulatory Visit: Payer: Medicare Other

## 2018-07-07 ENCOUNTER — Telehealth: Payer: Self-pay

## 2018-07-07 NOTE — Telephone Encounter (Signed)
    COVID-19 Pre-Screening Questions:  . In the past 7 to 10 days have you had a cough,  shortness of breath, headache, congestion, fever (100 or greater) body aches, chills, sore throat, or sudden loss of taste or sense of smell? . Have you been around anyone with known Covid 19. . Have you been around anyone who is awaiting Covid 19 test results in the past 7 to 10 days? . Have you been around anyone who has been exposed to Covid 19, or has mentioned symptoms of Covid 19 within the past 7 to 10 days?  If you have any concerns/questions about symptoms patients report during screening (either on the phone or at threshold). Contact the provider seeing the patient or DOD for further guidance.  If neither are available contact a member of the leadership team.          Pt answered NO to all pre-screening questions. [phone] 07/07/2018

## 2018-07-10 ENCOUNTER — Other Ambulatory Visit: Payer: Medicare Other | Admitting: *Deleted

## 2018-07-10 ENCOUNTER — Other Ambulatory Visit: Payer: Self-pay

## 2018-07-10 ENCOUNTER — Encounter (INDEPENDENT_AMBULATORY_CARE_PROVIDER_SITE_OTHER): Payer: Self-pay

## 2018-07-10 DIAGNOSIS — E7800 Pure hypercholesterolemia, unspecified: Secondary | ICD-10-CM

## 2018-07-10 LAB — LIPID PANEL
Chol/HDL Ratio: 3.6 ratio (ref 0.0–4.4)
Cholesterol, Total: 223 mg/dL — ABNORMAL HIGH (ref 100–199)
HDL: 62 mg/dL (ref >39)
LDL Calculated: 140 mg/dL — ABNORMAL HIGH (ref 0–99)
Triglycerides: 104 mg/dL (ref 0–149)
VLDL Cholesterol Cal: 21 mg/dL (ref 5–40)

## 2018-07-11 ENCOUNTER — Telehealth: Payer: Self-pay | Admitting: *Deleted

## 2018-07-11 NOTE — Telephone Encounter (Signed)
This RN spoke with pt per her call stating concern due to " I have a new area on the opposite breast where I had the cancer last year "  [place] states she noticed yesterday am a small nickel sized lump at the upper outer anterior area of her arm pit.  Lump is sore at times but not red or warm to touch.  Does not feel or look like a boil to her, she denies any known injury secondary to shaving.  Area discussed with pt stating concern for a second breast cancer- of note she is due for her " 1st mammogram since I was diagnosed on the the 23rd ( of June ).  Per discussion- pt will stop in to see this RN quick review by MD.  Kennyth Lose stated appreciation and a  "sense of relief " per above plan.

## 2018-07-12 ENCOUNTER — Encounter: Payer: Self-pay | Admitting: Oncology

## 2018-07-12 ENCOUNTER — Other Ambulatory Visit: Payer: Self-pay | Admitting: *Deleted

## 2018-07-12 ENCOUNTER — Inpatient Hospital Stay: Payer: Medicare Other | Attending: Oncology | Admitting: Oncology

## 2018-07-12 ENCOUNTER — Other Ambulatory Visit: Payer: Self-pay | Admitting: Oncology

## 2018-07-12 DIAGNOSIS — C50411 Malignant neoplasm of upper-outer quadrant of right female breast: Secondary | ICD-10-CM | POA: Insufficient documentation

## 2018-07-12 DIAGNOSIS — Z79899 Other long term (current) drug therapy: Secondary | ICD-10-CM | POA: Insufficient documentation

## 2018-07-12 DIAGNOSIS — Z170 Estrogen receptor positive status [ER+]: Secondary | ICD-10-CM | POA: Diagnosis not present

## 2018-07-12 DIAGNOSIS — Z79811 Long term (current) use of aromatase inhibitors: Secondary | ICD-10-CM | POA: Diagnosis not present

## 2018-07-12 DIAGNOSIS — Z87891 Personal history of nicotine dependence: Secondary | ICD-10-CM | POA: Insufficient documentation

## 2018-07-12 DIAGNOSIS — N63 Unspecified lump in unspecified breast: Secondary | ICD-10-CM

## 2018-07-12 NOTE — Progress Notes (Unsigned)
[place]  [phone]) [phone] Fax:([phone]) [phone]     ID: Melissa [name] DOB: 01-10-1951  MR#: [id number]  VXY#:[id number]  Patient Care Team: [facility] as PCP - General (Family Medicine) End, Harrell Gave, MD as PCP - Cardiology (Cardiology) Erroll Luna, MD as Consulting Physician (General Surgery) Zriyah Kopplin, Virgie Dad, MD as Consulting Physician (Oncology) Kyung Rudd, MD as Consulting Physician (Radiation Oncology) Christain Sacramento, MD as Consulting Physician (Family Medicine) Aletha Halim., PA-C as Physician Assistant (Family Medicine) Richmond Campbell, MD as Consulting Physician (Gastroenterology) Delice Bison, Charlestine Massed, NP as Nurse Practitioner (Hematology and Oncology) OTHER MD:    CHIEF COMPLAINT: Estrogen receptor positive breast cancer  CURRENT TREATMENT: Anastrozole   HISTORY OF CURRENT ILLNESS: From the original intake note:  Melissa [name] had routine screening bilateral mammography with tomography at [facility] on 07/12/2017 showing; breast density category C. There was a possible mass in the right breast. On 07/14/2017 she completed unilateral right ultrasonography at [facility] that showed an irregular hypoechoic 1.6 cm mass in the right upper outer quadrant and posterior depth. Evaluation of the right axilla showed 4 lymph nodes with cortical thickening suspicious of malignancy.   Accordingly on 07/19/2017 she proceeded to biopsy of the right breast area and 1 lymph node in question. 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  The lymph node was biopsied showing no evidence of carcinoma, which was found to be concordant.  Prognostic indicators significant for: estrogen receptor, 95% positive and progesterone receptor, 95% positive, both with strong staining intensity. Proliferation marker Ki67 at 10% . HER2 not amplified with  ratios HER2/CEP17 signals 1.22 and average HER2 copies per cell 1.95  The patient's subsequent history is as detailed below.   INTERVAL HISTORY: Melissa [name] presented today for an unscheduled visit.  She was very anxious because she felt some discomfort in her left breast axillary tail/axilla area and she palpated that area and felt a change.  She wanted [place] to take a look at that.  She continues on anastrozole generally with good tolerance.  She tells me she recently saw Dr. Thana Farr and had EGD and the polyps he had seen previously have completely resolved   REVIEW OF SYSTEMS: A detailed review of systems today was otherwise noncontributory  PAST MEDICAL HISTORY: Past Medical History:  Diagnosis Date   Anxiety    Arthritis    shoulders, hands   Cancer ([place]) 07/2017   right breast cancer   Chest pain    Diverticulosis    GERD (gastroesophageal reflux disease)    High cholesterol    IBS (irritable bowel syndrome)    Kidney disorder    Pt states only has left kidney   Panic attacks    Pulmonary embolism ([place]) 2018   pt states resolved and no longer takes blood thinners  Small pulmonary emboli - eliquis for 6 months- resolved.  Depressive and anxiety moods - xanax One functioning Kidney   PAST SURGICAL HISTORY: Past Surgical History:  Procedure Laterality Date   BREAST LUMPECTOMY WITH RADIOACTIVE SEED AND SENTINEL LYMPH NODE BIOPSY Right 08/23/2017   Procedure: BREAST LUMPECTOMY WITH RADIOACTIVE SEED AND SENTINEL LYMPH NODE BIOPSY;  Surgeon: Erroll Luna, MD;  Location: [place];  Service: General;  Laterality: Right;   TUBAL LIGATION      FAMILY HISTORY Family History  Problem Relation Age of Onset   Alzheimer's disease Mother    Stroke Father 77  Heart attack Maternal Grandmother    Heart attack Paternal Grandmother    Cervical cancer Cousin    Colon cancer Paternal Aunt    Colon cancer Cousin    The patient's father died at age  63 due to CHF. The patient's mother died at age 60 due to Alzheimer's. The patient has 1 brother and 4 sisters. There was a paternal 1st cousin diagnosed with cervical cancer at age 87. There was a paternal aunt diagnosed with colon cancer at age 63. There was a paternal 1st cousin diagnosed with colon cancer at age 20. The patient denies a family history of breast or ovarian cancer.     GYNECOLOGIC HISTORY:  No LMP recorded. Patient is postmenopausal. Menarche: 68 years old Age at first live birth: 68 years old She is GX P1. Her LMP was in 2008. She used oral contraception with no complications. She did not used HRT.     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 Her husband, [name], died in 2016-12-13 due to pancreatic cancer. He was a Furniture conservator/restorer. She lives alone with her 47 year old dog, a Web designer. The patient's daughter, Melissa [name] lives in [place] and works at [employer]. Melissa [name] has two blood grandchildren and two step-grandchildren.   ADVANCED DIRECTIVES: The patient plans to name her daughter, Melissa [name] as her HCPOA. The appropriate forms were given at the 07/27/2017 visit.    HEALTH MAINTENANCE: Social History   Tobacco Use   Smoking status: Former Smoker    Packs/day: 20.00    Years: 1.00    Pack years: 20.00    Types: Cigarettes    Quit date: 1985    Years since quitting: 35.4   Smokeless tobacco: Never Used  Substance Use Topics   Alcohol use: Yes    Frequency: Never    Comment: social   Drug use: Never    Colonoscopy: Feb. 2019/ [facility]/ diverticulosis  PAP: Nov. 2018  Bone density: Nov. 2018 / osteopenia   Allergies  Allergen Reactions   Clindamycin Diarrhea and Nausea Only    Current Outpatient Medications  Medication Sig Dispense Refill   acetaminophen (TYLENOL) 500 MG tablet Take 1,000 mg by mouth every 6 (six) hours as needed for mild pain.     ALPRAZolam (XANAX) 0.25 MG tablet 3 (three)  times daily. TAKE 1/2 TABLET IN THE MORNING AND 1/2 IN THE EVENING AT BEDTIME     anastrozole (ARIMIDEX) 1 MG tablet Take 1 tablet (1 mg total) by mouth daily. 90 tablet 12   cholecalciferol 2000 units tablet Take 0.5 tablets (1,000 Units total) by mouth daily.     hydrocortisone cream 1 % Apply 1 application topically as needed for itching.     hyoscyamine (ANASPAZ) 0.125 MG TBDP disintergrating tablet Take 0.125 mg by mouth every 6 (six) hours as needed.  3   omeprazole (PRILOSEC) 20 MG capsule Take 20 mg by mouth 2 (two) times daily before a meal.     No current facility-administered medications for this visit.     OBJECTIVE: Middle-aged white woman who appears stated age  There were no vitals filed for this visit.   There is no height or weight on file to calculate BMI.   Wt Readings from Last 3 Encounters:  03/02/18 170 lb 11.2 oz (77.4 kg)  12/29/17 171 lb 9.6 oz (77.8 kg)  11/23/17 173 lb (78.5 kg)      ECOG FS:1 - Symptomatic but completely ambulatory  Sclerae unicteric, pupils round and equal No cervical or supraclavicular adenopathy Lungs no rales or rhonchi Heart regular rate and rhythm Abd soft, nontender, positive bowel sounds MSK no focal spinal tenderness, no upper extremity lymphedema Neuro: nonfocal, well oriented, appropriate affect Breasts: The left breast is unremarkable.  The left axillary tail shows the usual fat pad.  Deep in there though I do feel an area of slight firmness which is not tender.  There is no overlying erythema.  The left axilla itself is benign.   LAB RESULTS:  CMP     Component Value Date/Time   NA 141 03/02/2018 0947   K 3.7 03/02/2018 0947   CL 105 03/02/2018 0947   CO2 27 03/02/2018 0947   GLUCOSE 97 03/02/2018 0947   BUN 13 03/02/2018 0947   CREATININE 0.79 03/02/2018 0947   CALCIUM 9.6 03/02/2018 0947   PROT 7.4 03/02/2018 0947   ALBUMIN 4.1 03/02/2018 0947   AST 24 03/02/2018 0947   ALT 26 03/02/2018 0947   ALKPHOS 76  03/02/2018 0947   BILITOT 0.7 03/02/2018 0947   GFRNONAA >60 03/02/2018 0947   GFRAA >60 03/02/2018 0947    No results found for: TOTALPROTELP, ALBUMINELP, A1GS, A2GS, BETS, BETA2SER, GAMS, MSPIKE, SPEI  No results found for: Nils Pyle, [facility]  Lab Results  Component Value Date   WBC 6.0 03/02/2018   NEUTROABS 3.3 03/02/2018   HGB 13.5 03/02/2018   HCT 41.4 03/02/2018   MCV 96.7 03/02/2018   PLT 267 03/02/2018    @LASTCHEMISTRY @  No results found for: LABCA2  No components found for: AQTMAU633  No results for input(s): INR in the last 168 hours.  No results found for: LABCA2  No results found for: HLK562  No results found for: BWL893  No results found for: TDS287  No results found for: CA2729  No components found for: HGQUANT  No results found for: CEA1 / No results found for: CEA1   No results found for: AFPTUMOR  No results found for: CHROMOGRNA  No results found for: PSA1  Lab on 07/10/2018  Component Date Value Ref Range Status   Cholesterol, Total 07/10/2018 223* 100 - 199 mg/dL Final   Triglycerides 07/10/2018 104  0 - 149 mg/dL Final   HDL 07/10/2018 62  >39 mg/dL Final   VLDL Cholesterol Cal 07/10/2018 21  5 - 40 mg/dL Final   LDL Calculated 07/10/2018 140* 0 - 99 mg/dL Final   Chol/HDL Ratio 07/10/2018 3.6  0.0 - 4.4 ratio Final   Comment:                                   T. Chol/HDL Ratio                                             Men  Women                               1/2 Avg.Risk  3.4    3.3                                   Avg.Risk  5.0    4.4  2X Avg.Risk  9.6    7.1                                3X Avg.Risk 23.4   11.0     (this displays the last labs from the last 3 days)  No results found for: TOTALPROTELP, ALBUMINELP, A1GS, A2GS, BETS, BETA2SER, GAMS, MSPIKE, SPEI (this displays SPEP labs)  No results found for: KPAFRELGTCHN, LAMBDASER, KAPLAMBRATIO (kappa/lambda light  chains)  No results found for: HGBA, HGBA2QUANT, HGBFQUANT, HGBSQUAN (Hemoglobinopathy evaluation)   No results found for: LDH  No results found for: IRON, TIBC, IRONPCTSAT (Iron and TIBC)  No results found for: FERRITIN  Urinalysis    Component Value Date/Time   COLORURINE STRAW (A) 03/26/2017 [place] 03/26/2017 0724   LABSPEC 1.012 03/26/2017 0724   PHURINE 7.0 03/26/2017 0724   GLUCOSEU NEGATIVE 03/26/2017 0724   HGBUR SMALL (A) 03/26/2017 0724   BILIRUBINUR NEGATIVE 03/26/2017 0724   KETONESUR NEGATIVE 03/26/2017 0724   PROTEINUR NEGATIVE 03/26/2017 0724   NITRITE NEGATIVE 03/26/2017 0724   LEUKOCYTESUR NEGATIVE 03/26/2017 0724    STUDIES: Repeat mammography is already scheduled for later this month; DEXA scan will be due at the same time  ELIGIBLE FOR AVAILABLE RESEARCH PROTOCOL: no   ASSESSMENT: 68 y.o. Melissa [name], Melissa [name] woman status post right breast upper outer quadrant biopsy 07/19/2017 for a clinical T1c NX, stage IA invasive ductal carcinoma, grade 2, estrogen and progesterone receptor positive, HER-2 not amplified, with an MIB-1 of 10%  (a) 1 of 4 suspicious axillary lymph nodes biopsied 07/19/2017 was negative  (1) status post right lumpectomy and sentinel lymph node sampling 08/23/2017 for a pT1c pN0, stage IA invasive ductal carcinoma, grade 2, with negative margins  (a) total of 5 sentinel lymph nodes removed  (2) The Oncotype DX score was 12, predicting a risk of outside the breast recurrence over the next 9 years of 3% if the patient's only systemic therapy is tamoxifen for 5 years.  It also predicts no significant benefit from chemotherapy.  (3) adjuvant radiation completed 09/27/2017 - 10/24/2017  Site/dose: The patient initially received a dose of 42.56 Gy in 16 fractions to the breast using whole-breast tangent fields. This was delivered using a 3-D conformal technique. The patient then received a boost to the seroma. This delivered an  additional 8 Gy in 4 fractions using a 3 field photon technique due to the depth of the seroma. The total dose was 50.56 Gy.  (4) anastrozole started 11/25/2017  (a) density 05/10/2016 showed a T score of -1.9  (b) repeat DEXA scan June 2020  (5) history of acute right lower lobe pulmonary embolus 03/19/2016; bilateral Dopplers lower extremity same day were clear  (a) status post apixaban x6 months   PLAN: Most likely the change in the left axillary tail is going to be slight fat change, but I certainly cannot tell by palpating.  She is already scheduled for bilateral mammography 07/18/2018 but she is not comfortable waiting that long to get this problem resolved.  I have written for left mammography and ultrasonography today.  I have sent a note to the mammography nurse at [facility] to see if we can get it done within the next 24 hours  Otherwise she is already scheduled to return to see me in July.  If the mammogram here and then on the right side in June is normal we may want  to move back her visit  She knows to call for any other problems that may develop before then.  Melissa [name], Virgie Dad, MD  07/12/18 10:58 AM Medical Oncology and Hematology [facility] [address] [place],  [address] Tel. [phone]    Fax. [phone]  I, Jacqualyn Posey am acting as a Education administrator for Chauncey Cruel, MD.   I, Lurline Del MD, have reviewed the above documentation for accuracy and completeness, and I agree with the above.

## 2018-07-12 NOTE — Progress Notes (Signed)
[place]  Telephone:([phone]) [phone] Fax:(336) [phone]     ID: Melissa [name] DOB: August 02, 1950  MR#: [id number]  KPV#:[id number]  Patient Care Team: [facility] as PCP - General (Family Medicine) End, Harrell Gave, MD as PCP - Cardiology (Cardiology) Erroll Luna, MD as Consulting Physician (General Surgery) Oliana Gowens, Virgie Dad, MD as Consulting Physician (Oncology) Kyung Rudd, MD as Consulting Physician (Radiation Oncology) Christain Sacramento, MD as Consulting Physician (Family Medicine) Aletha Halim., PA-C as Physician Assistant (Family Medicine) Richmond Campbell, MD as Consulting Physician (Gastroenterology) Delice Bison, Charlestine Massed, NP as Nurse Practitioner (Hematology and Oncology) OTHER MD:    CHIEF COMPLAINT: Estrogen receptor positive breast cancer  CURRENT TREATMENT: Anastrozole   HISTORY OF CURRENT ILLNESS: From the original intake note:  Melissa [name] had routine screening bilateral mammography with tomography at [facility] on 07/12/2017 showing; breast density category C. There was a possible mass in the right breast. On 07/14/2017 she completed unilateral right ultrasonography at [facility] that showed an irregular hypoechoic 1.6 cm mass in the right upper outer quadrant and posterior depth. Evaluation of the right axilla showed 4 lymph nodes with cortical thickening suspicious of malignancy.   Accordingly on 07/19/2017 she proceeded to biopsy of the right breast area and 1 lymph node in question. 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  The lymph node was biopsied showing no evidence of carcinoma, which was found to be concordant.  Prognostic indicators significant for: estrogen receptor, 95% positive and progesterone receptor, 95% positive, both with strong staining intensity. Proliferation marker Ki67 at 10% . HER2 not amplified with  ratios HER2/CEP17 signals 1.22 and average HER2 copies per cell 1.95  The patient's subsequent history is as detailed below.   INTERVAL HISTORY: Melissa [name] presented today for an unscheduled visit.  She was very anxious because she felt some discomfort in her left breast axillary tail/axilla area and she palpated that area and felt a change.  She wanted [place] to take a look at that.  She continues on anastrozole generally with good tolerance.  She tells me she recently saw Dr. Thana Farr and had EGD and the polyps he had seen previously have completely resolved   REVIEW OF SYSTEMS: A detailed review of systems today was otherwise noncontributory  PAST MEDICAL HISTORY: Past Medical History:  Diagnosis Date   Anxiety    Arthritis    shoulders, hands   Cancer ([place]) 07/2017   right breast cancer   Chest pain    Diverticulosis    GERD (gastroesophageal reflux disease)    High cholesterol    IBS (irritable bowel syndrome)    Kidney disorder    Pt states only has left kidney   Panic attacks    Pulmonary embolism ([place]) 2018   pt states resolved and no longer takes blood thinners  Small pulmonary emboli - eliquis for 6 months- resolved.  Depressive and anxiety moods - xanax One functioning Kidney   PAST SURGICAL HISTORY: Past Surgical History:  Procedure Laterality Date   BREAST LUMPECTOMY WITH RADIOACTIVE SEED AND SENTINEL LYMPH NODE BIOPSY Right 08/23/2017   Procedure: BREAST LUMPECTOMY WITH RADIOACTIVE SEED AND SENTINEL LYMPH NODE BIOPSY;  Surgeon: Erroll Luna, MD;  Location: [place];  Service: General;  Laterality: Right;   TUBAL LIGATION      FAMILY HISTORY Family History  Problem Relation Age of Onset   Alzheimer's disease Mother    Stroke Father 1  Heart attack Maternal Grandmother    Heart attack Paternal Grandmother    Cervical cancer Cousin    Colon cancer Paternal Aunt    Colon cancer Cousin    The patient's father died at age  47 due to CHF. The patient's mother died at age 67 due to Alzheimer's. The patient has 1 brother and 4 sisters. There was a paternal 1st cousin diagnosed with cervical cancer at age 45. There was a paternal aunt diagnosed with colon cancer at age 66. There was a paternal 1st cousin diagnosed with colon cancer at age 49. The patient denies a family history of breast or ovarian cancer.     GYNECOLOGIC HISTORY:  No LMP recorded. Patient is postmenopausal. Menarche: 68 years old Age at first live birth: 68 years old She is GX P1. Her LMP was in 2008. She used oral contraception with no complications. She did not used HRT.     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 Her husband, [name], died in 12/01/2016 due to pancreatic cancer. He was a Furniture conservator/restorer. She lives alone with her 55 year old dog, a Web designer. The patient's daughter, Melissa [name] lives in [place] and works at [employer]. Melissa [name] has two blood grandchildren and two step-grandchildren.   ADVANCED DIRECTIVES: The patient plans to name her daughter, Melissa [name] as her HCPOA. The appropriate forms were given at the 07/27/2017 visit.    HEALTH MAINTENANCE: Social History   Tobacco Use   Smoking status: Former Smoker    Packs/day: 20.00    Years: 1.00    Pack years: 20.00    Types: Cigarettes    Quit date: 1985    Years since quitting: 35.4   Smokeless tobacco: Never Used  Substance Use Topics   Alcohol use: Yes    Frequency: Never    Comment: social   Drug use: Never    Colonoscopy: Feb. 2019/ [facility]/ diverticulosis  PAP: Nov. 2018  Bone density: Nov. 2018 / osteopenia   Allergies  Allergen Reactions   Clindamycin Diarrhea and Nausea Only    Current Outpatient Medications  Medication Sig Dispense Refill   acetaminophen (TYLENOL) 500 MG tablet Take 1,000 mg by mouth every 6 (six) hours as needed for mild pain.     ALPRAZolam (XANAX) 0.25 MG tablet 3 (three)  times daily. TAKE 1/2 TABLET IN THE MORNING AND 1/2 IN THE EVENING AT BEDTIME     anastrozole (ARIMIDEX) 1 MG tablet Take 1 tablet (1 mg total) by mouth daily. 90 tablet 12   cholecalciferol 2000 units tablet Take 0.5 tablets (1,000 Units total) by mouth daily.     hydrocortisone cream 1 % Apply 1 application topically as needed for itching.     hyoscyamine (ANASPAZ) 0.125 MG TBDP disintergrating tablet Take 0.125 mg by mouth every 6 (six) hours as needed.  3   omeprazole (PRILOSEC) 20 MG capsule Take 20 mg by mouth 2 (two) times daily before a meal.     No current facility-administered medications for this visit.     OBJECTIVE: Middle-aged white woman who appears stated age  There were no vitals filed for this visit.   There is no height or weight on file to calculate BMI.   Wt Readings from Last 3 Encounters:  03/02/18 170 lb 11.2 oz (77.4 kg)  12/29/17 171 lb 9.6 oz (77.8 kg)  11/23/17 173 lb (78.5 kg)      ECOG FS:1 - Symptomatic but completely ambulatory  Sclerae unicteric, pupils round and equal No cervical or supraclavicular adenopathy Lungs no rales or rhonchi Heart regular rate and rhythm Abd soft, nontender, positive bowel sounds MSK no focal spinal tenderness, no upper extremity lymphedema Neuro: nonfocal, well oriented, appropriate affect Breasts: The left breast is unremarkable.  The left axillary tail shows the usual fat pad.  Deep in there though I do feel an area of slight firmness which is not tender.  There is no overlying erythema.  The left axilla itself is benign.   LAB RESULTS:  CMP     Component Value Date/Time   NA 141 03/02/2018 0947   K 3.7 03/02/2018 0947   CL 105 03/02/2018 0947   CO2 27 03/02/2018 0947   GLUCOSE 97 03/02/2018 0947   BUN 13 03/02/2018 0947   CREATININE 0.79 03/02/2018 0947   CALCIUM 9.6 03/02/2018 0947   PROT 7.4 03/02/2018 0947   ALBUMIN 4.1 03/02/2018 0947   AST 24 03/02/2018 0947   ALT 26 03/02/2018 0947   ALKPHOS 76  03/02/2018 0947   BILITOT 0.7 03/02/2018 0947   GFRNONAA >60 03/02/2018 0947   GFRAA >60 03/02/2018 0947    No results found for: TOTALPROTELP, ALBUMINELP, A1GS, A2GS, BETS, BETA2SER, GAMS, MSPIKE, SPEI  No results found for: [facility]  Lab Results  Component Value Date   WBC 6.0 03/02/2018   NEUTROABS 3.3 03/02/2018   HGB 13.5 03/02/2018   HCT 41.4 03/02/2018   MCV 96.7 03/02/2018   PLT 267 03/02/2018    @LASTCHEMISTRY @  No results found for: LABCA2  No components found for: [id number]  No results for input(s): INR in the last 168 hours.  No results found for: LABCA2  No results found for: ANV916  No results found for: OMA004  No results found for: HTX774  No results found for: CA2729  No components found for: HGQUANT  No results found for: CEA1 / No results found for: CEA1   No results found for: AFPTUMOR  No results found for: CHROMOGRNA  No results found for: PSA1  Lab on 07/10/2018  Component Date Value Ref Range Status   Cholesterol, Total 07/10/2018 223* 100 - 199 mg/dL Final   Triglycerides 07/10/2018 104  0 - 149 mg/dL Final   HDL 07/10/2018 62  >39 mg/dL Final   VLDL Cholesterol Cal 07/10/2018 21  5 - 40 mg/dL Final   LDL Calculated 07/10/2018 140* 0 - 99 mg/dL Final   Chol/HDL Ratio 07/10/2018 3.6  0.0 - 4.4 ratio Final   Comment:                                   T. Chol/HDL Ratio                                             Men  Women                               1/2 Avg.Risk  3.4    3.3                                   Avg.Risk  5.0    4.4  2X Avg.Risk  9.6    7.1                                3X Avg.Risk 23.4   11.0     (this displays the last labs from the last 3 days)  No results found for: TOTALPROTELP, ALBUMINELP, A1GS, A2GS, BETS, BETA2SER, GAMS, MSPIKE, SPEI (this displays SPEP labs)  No results found for: KPAFRELGTCHN, LAMBDASER, KAPLAMBRATIO (kappa/lambda light  chains)  No results found for: HGBA, HGBA2QUANT, HGBFQUANT, HGBSQUAN (Hemoglobinopathy evaluation)   No results found for: LDH  No results found for: IRON, TIBC, IRONPCTSAT (Iron and TIBC)  No results found for: FERRITIN  Urinalysis    Component Value Date/Time   COLORURINE STRAW (A) 03/26/2017 [place] 03/26/2017 0724   LABSPEC 1.012 03/26/2017 0724   PHURINE 7.0 03/26/2017 0724   GLUCOSEU NEGATIVE 03/26/2017 0724   HGBUR SMALL (A) 03/26/2017 0724   BILIRUBINUR NEGATIVE 03/26/2017 0724   KETONESUR NEGATIVE 03/26/2017 0724   PROTEINUR NEGATIVE 03/26/2017 0724   NITRITE NEGATIVE 03/26/2017 0724   LEUKOCYTESUR NEGATIVE 03/26/2017 0724    STUDIES: Repeat mammography is already scheduled for later this month; DEXA scan will be due at the same time  ELIGIBLE FOR AVAILABLE RESEARCH PROTOCOL: no   ASSESSMENT: 68 y.o. [place], [place] woman status post right breast upper outer quadrant biopsy 07/19/2017 for a clinical T1c NX, stage IA invasive ductal carcinoma, grade 2, estrogen and progesterone receptor positive, HER-2 not amplified, with an MIB-1 of 10%  (a) 1 of 4 suspicious axillary lymph nodes biopsied 07/19/2017 was negative  (1) status post right lumpectomy and sentinel lymph node sampling 08/23/2017 for a pT1c pN0, stage IA invasive ductal carcinoma, grade 2, with negative margins  (a) total of 5 sentinel lymph nodes removed  (2) The Oncotype DX score was 12, predicting a risk of outside the breast recurrence over the next 9 years of 3% if the patient's only systemic therapy is tamoxifen for 5 years.  It also predicts no significant benefit from chemotherapy.  (3) adjuvant radiation completed 09/27/2017 - 10/24/2017  Site/dose: The patient initially received a dose of 42.56 Gy in 16 fractions to the breast using whole-breast tangent fields. This was delivered using a 3-D conformal technique. The patient then received a boost to the seroma. This delivered an  additional 8 Gy in 4 fractions using a 3 field photon technique due to the depth of the seroma. The total dose was 50.56 Gy.  (4) anastrozole started 11/25/2017  (a) density 05/10/2016 showed a T score of -1.9  (b) repeat DEXA scan June 2020  (5) history of acute right lower lobe pulmonary embolus 03/19/2016; bilateral Dopplers lower extremity same day were clear  (a) status post apixaban x6 months   PLAN: Most likely the change in the left axillary tail is going to be slight fat change, but I certainly cannot tell by palpating.  She is already scheduled for bilateral mammography 07/18/2018 but she is not comfortable waiting that long to get this problem resolved.  I have written for left mammography and ultrasonography today.  I have sent a note to the mammography nurse at [facility] to see if we can get it done within the next 24 hours  Otherwise she is already scheduled to return to see me in July.  If the mammogram here and then on the right side in June is normal we may want  to move back her visit  She knows to call for any other problems that may develop before then.  Anastasiya Gowin, Virgie Dad, MD  07/12/18 11:02 AM Medical Oncology and Hematology [facility] [address] [place], [place] [address] Tel. [phone]    Fax. [phone]  I, Jacqualyn Posey am acting as a Education administrator for Chauncey Cruel, MD.   I, Lurline Del MD, have reviewed the above documentation for accuracy and completeness, and I agree with the above.

## 2018-08-01 ENCOUNTER — Other Ambulatory Visit: Payer: Self-pay | Admitting: *Deleted

## 2018-08-01 DIAGNOSIS — C50411 Malignant neoplasm of upper-outer quadrant of right female breast: Secondary | ICD-10-CM

## 2018-08-01 DIAGNOSIS — Z170 Estrogen receptor positive status [ER+]: Secondary | ICD-10-CM

## 2018-08-01 NOTE — Progress Notes (Signed)
[place]  [phone] [phone]     ID: Melissa [name] DOB: Aug 15, 1950  MR#: [id number]  EPP#:[id number]  Patient Care Team: [name] as PCP - General (Family Medicine) End, Melissa Gave, MD as PCP - Cardiology (Cardiology) Melissa Luna, MD as Consulting Physician (General Surgery) Melissa [name], Melissa Dad, MD as Consulting Physician (Oncology) Melissa Rudd, MD as Consulting Physician (Radiation Oncology) Melissa Sacramento, MD as Consulting Physician (Family Medicine) Melissa [name]., PA-C as Physician Assistant (Family Medicine) Melissa Campbell, MD as Consulting Physician (Gastroenterology) Melissa [name], Melissa Massed, NP as Nurse Practitioner (Hematology and Oncology) OTHER MD:    CHIEF COMPLAINT: Estrogen receptor positive breast cancer  CURRENT TREATMENT: Anastrozole   INTERVAL HISTORY: Melissa [name] returns today for follow up of a palpable area to her left breast axillary tail/upper-outer quadrant.  Since her last visit, she underwent bilateral diagnostic mammography at [facility] on 07/12/2018 showing: breast density category C; prominent fat pad along the anterior left lower axilla at area of patient's concern, no suspicious masses palpated in the left upper-outer quadrant; sonography showed no significant abnormalities in the left breast or the left axilla.  Two normal-appearing nodes seen in the left axilla.  Bone density at [facility] obtained 06/29/2018 showed a T score of -2.3.  She continues on anastrozole with good tolerance. She reports mild hot flashes, noting they are not as bad as when she went through menopause, that do not wake her up. She also reports vaginal dryness for which she uses coconut oil.    REVIEW OF SYSTEMS: Melissa [name] reports pain to her breast and arthritis in her shoulders. She takes tylenol for these. She walks her dog around her field 2-3 times a day for exercise. She lives alone and does her own  shopping. Her daughter visits sometimes and assists her with doctor visits. She states she is followed by Dr. Earlean Shawl for stomach polyps. A detailed review of systems today was otherwise noncontributory.   HISTORY OF CURRENT ILLNESS: From the original intake note:  Melissa [name] had routine screening bilateral mammography with tomography at [facility] on 07/12/2017 showing; breast density category C. There was a possible mass in the right breast. On 07/14/2017 she completed unilateral right ultrasonography at [facility] that showed an irregular hypoechoic 1.6 cm mass in the right upper outer quadrant and posterior depth. Evaluation of the right axilla showed 4 lymph nodes with cortical thickening suspicious of malignancy.   Accordingly on 07/19/2017 she proceeded to biopsy of the right breast area and 1 lymph node in question. 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  The lymph node was biopsied showing no evidence of carcinoma, which was found to be concordant.  Prognostic indicators significant for: estrogen receptor, 95% positive and progesterone receptor, 95% positive, both with strong staining intensity. Proliferation marker Ki67 at 10% . HER2 not amplified with ratios HER2/CEP17 signals 1.22 and average HER2 copies per cell 1.95  The patient's subsequent history is as detailed below.   PAST MEDICAL HISTORY: Past Medical History:  Diagnosis Date  . Anxiety   . Arthritis    shoulders, hands  . Cancer ([place]) 07/2017   right breast cancer  . Chest pain   . Diverticulosis   . GERD (gastroesophageal reflux disease)   . High cholesterol   . IBS (irritable bowel syndrome)   . Kidney disorder    Pt states only has left kidney  . Panic attacks   .  Pulmonary embolism ([place]) 2018   pt states resolved and no longer takes blood thinners  Small pulmonary emboli - eliquis for 6 months- resolved.  Depressive  and anxiety moods - xanax One functioning Kidney   PAST SURGICAL HISTORY: Past Surgical History:  Procedure Laterality Date  . BREAST LUMPECTOMY WITH RADIOACTIVE SEED AND SENTINEL LYMPH NODE BIOPSY Right 08/23/2017   Procedure: BREAST LUMPECTOMY WITH RADIOACTIVE SEED AND SENTINEL LYMPH NODE BIOPSY;  Surgeon: Melissa Luna, MD;  Location: [place];  Service: General;  Laterality: Right;  . TUBAL LIGATION      FAMILY HISTORY Family History  Problem Relation Age of Onset  . Alzheimer's disease Mother   . Stroke Father 69  . Heart attack Maternal Grandmother   . Heart attack Paternal Grandmother   . Cervical cancer Cousin   . Colon cancer Paternal Aunt   . Colon cancer Cousin    The patient's father died at age 46 due to CHF. The patient's mother died at age 70 due to Alzheimer's. The patient has 1 brother and 4 sisters. There was a paternal 1st cousin diagnosed with cervical cancer at age 63. There was a paternal aunt diagnosed with colon cancer at age 70. There was a paternal 1st cousin diagnosed with colon cancer at age 68. The patient denies a family history of breast or ovarian cancer.     GYNECOLOGIC HISTORY:  No LMP recorded. Patient is postmenopausal. Menarche: 68 years old Age at first live birth: 68 years old She is GX P1. Her LMP was in 2008. She used oral contraception with no complications. She did not used HRT.     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 Her husband, Melissa [name], died in 12/17/16 due to pancreatic cancer. He was a Furniture conservator/restorer. She lives alone with her 61 year old dog, a Web designer. The patient's daughter, Melissa [name] lives in [place] and works at [employer]. Melissa [name] has two blood grandchildren and two step-grandchildren.   ADVANCED DIRECTIVES: The patient plans to name her daughter, Melissa [name] as her HCPOA. The appropriate forms were given at the 07/27/2017 visit.    HEALTH  MAINTENANCE: Social History   Tobacco Use  . Smoking status: Former Smoker    Packs/day: 20.00    Years: 1.00    Pack years: 20.00    Types: Cigarettes    Quit date: 1985    Years since quitting: 35.5  . Smokeless tobacco: Never Used  Substance Use Topics  . Alcohol use: Yes    Frequency: Never    Comment: social  . Drug use: Never    Colonoscopy: Feb. 2019/ [facility]/ diverticulosis  PAP: Nov. 2018  Bone density: Nov. 2018 / osteopenia   Allergies  Allergen Reactions  . Clindamycin Diarrhea and Nausea Only    Current Outpatient Medications  Medication Sig Dispense Refill  . acetaminophen (TYLENOL) 500 MG tablet Take 1,000 mg by mouth every 6 (six) hours as needed for mild pain.    Marland Kitchen ALPRAZolam (XANAX) 0.25 MG tablet 3 (three) times daily. TAKE 1/2 TABLET IN THE MORNING AND 1/2 IN THE EVENING AT BEDTIME    . anastrozole (ARIMIDEX) 1 MG tablet Take 1 tablet (1 mg total) by mouth daily. 90 tablet 12  . cholecalciferol 2000 units tablet Take 0.5 tablets (1,000 Units total) by mouth daily.    . hydrocortisone cream 1 % Apply 1 application topically as needed for itching.    . hyoscyamine (ANASPAZ) 0.125 MG  TBDP disintergrating tablet Take 0.125 mg by mouth every 6 (six) hours as needed.  3  . omeprazole (PRILOSEC) 20 MG capsule Take 20 mg by mouth 2 (two) times daily before a meal.     No current facility-administered medications for this visit.     OBJECTIVE: Middle-aged white woman in no acute distress  Vitals:   08/02/18 0918  BP: (!) 132/56  Pulse: 76  Resp: 18  Temp: 98.2 F (36.8 C)  SpO2: 98%     Body mass index is 28.96 kg/m.   Wt Readings from Last 3 Encounters:  08/02/18 168 lb 11.2 oz (76.5 kg)  03/02/18 170 lb 11.2 oz (77.4 kg)  12/29/17 171 lb 9.6 oz (77.8 kg)      ECOG FS:1 - Symptomatic but completely ambulatory  Sclerae unicteric, EOMs intact Oropharynx clear and moist No cervical or supraclavicular adenopathy Lungs no rales or rhonchi Heart  regular rate and rhythm Abd soft, nontender, positive bowel sounds MSK no focal spinal tenderness, no upper extremity lymphedema Neuro: nonfocal, well oriented, appropriate affect Breasts: The right breast is status post lumpectomy and radiation there.  There is the expected skin thickening.  There is no evidence of disease recurrence.  The left breast is entirely unremarkable.  Both axillae are benign.   LAB RESULTS:  CMP     Component Value Date/Time   NA 140 08/02/2018 0904   K 3.7 08/02/2018 0904   CL 104 08/02/2018 0904   CO2 26 08/02/2018 0904   GLUCOSE 95 08/02/2018 0904   BUN 15 08/02/2018 0904   CREATININE 0.85 08/02/2018 0904   CALCIUM 9.2 08/02/2018 0904   PROT 7.5 08/02/2018 0904   ALBUMIN 3.9 08/02/2018 0904   AST 23 08/02/2018 0904   ALT 18 08/02/2018 0904   ALKPHOS 79 08/02/2018 0904   BILITOT 0.5 08/02/2018 0904   GFRNONAA >60 08/02/2018 0904   GFRAA >60 08/02/2018 0904    No results found for: TOTALPROTELP, ALBUMINELP, A1GS, A2GS, BETS, BETA2SER, GAMS, MSPIKE, SPEI  No results found for: Nils Pyle, [facility]  Lab Results  Component Value Date   WBC 6.1 08/02/2018   NEUTROABS 3.3 08/02/2018   HGB 13.5 08/02/2018   HCT 40.8 08/02/2018   MCV 94.2 08/02/2018   PLT 251 08/02/2018    @LASTCHEMISTRY @  No results found for: LABCA2  No components found for: AJOINO676  No results for input(s): INR in the last 168 hours.  No results found for: LABCA2  No results found for: HMC947  No results found for: SJG283  No results found for: MOQ947  No results found for: CA2729  No components found for: HGQUANT  No results found for: CEA1 / No results found for: CEA1   No results found for: AFPTUMOR  No results found for: CHROMOGRNA  No results found for: PSA1  Appointment on 08/02/2018  Component Date Value Ref Range Status  . Sodium 08/02/2018 140  135 - 145 mmol/L Final  . Potassium 08/02/2018 3.7  3.5 - 5.1 mmol/L Final  .  Chloride 08/02/2018 104  98 - 111 mmol/L Final  . CO2 08/02/2018 26  22 - 32 mmol/L Final  . Glucose, Bld 08/02/2018 95  70 - 99 mg/dL Final  . BUN 08/02/2018 15  8 - 23 mg/dL Final  . Creatinine 08/02/2018 0.85  0.44 - 1.00 mg/dL Final  . Calcium 08/02/2018 9.2  8.9 - 10.3 mg/dL Final  . Total Protein 08/02/2018 7.5  6.5 - 8.1 g/dL Final  .  Albumin 08/02/2018 3.9  3.5 - 5.0 g/dL Final  . AST 08/02/2018 23  15 - 41 U/L Final  . ALT 08/02/2018 18  0 - 44 U/L Final  . Alkaline Phosphatase 08/02/2018 79  38 - 126 U/L Final  . Total Bilirubin 08/02/2018 0.5  0.3 - 1.2 mg/dL Final  . GFR, Est Non Af Am 08/02/2018 >60  >60 mL/min Final  . GFR, Est AFR Am 08/02/2018 >60  >60 mL/min Final  . Anion gap 08/02/2018 10  5 - 15 Final   Performed at [facility], [place] [address] [place], [place] [address]  . WBC Count 08/02/2018 6.1  4.0 - 10.5 K/uL Final  . RBC 08/02/2018 4.33  3.87 - 5.11 MIL/uL Final  . Hemoglobin 08/02/2018 13.5  12.0 - 15.0 g/dL Final  . HCT 08/02/2018 40.8  36.0 - 46.0 % Final  . MCV 08/02/2018 94.2  80.0 - 100.0 fL Final  . MCH 08/02/2018 31.2  26.0 - 34.0 pg Final  . MCHC 08/02/2018 33.1  30.0 - 36.0 g/dL Final  . RDW 08/02/2018 12.6  11.5 - 15.5 % Final  . Platelet Count 08/02/2018 251  150 - 400 K/uL Final  . nRBC 08/02/2018 0.0  0.0 - 0.2 % Final  . Neutrophils Relative % 08/02/2018 55  % Final  . Neutro Abs 08/02/2018 3.3  1.7 - 7.7 K/uL Final  . Lymphocytes Relative 08/02/2018 32  % Final  . Lymphs Abs 08/02/2018 2.0  0.7 - 4.0 K/uL Final  . Monocytes Relative 08/02/2018 10  % Final  . Monocytes Absolute 08/02/2018 0.6  0.1 - 1.0 K/uL Final  . Eosinophils Relative 08/02/2018 2  % Final  . Eosinophils Absolute 08/02/2018 0.1  0.0 - 0.5 K/uL Final  . Basophils Relative 08/02/2018 1  % Final  . Basophils Absolute 08/02/2018 0.1  0.0 - 0.1 K/uL Final  . Immature Granulocytes 08/02/2018 0  % Final  . Abs Immature Granulocytes 08/02/2018 0.02   0.00 - 0.07 K/uL Final   Performed at [facility], [place] [address] [place], [place] [address]    (this displays the last labs from the last 3 days)  No results found for: TOTALPROTELP, ALBUMINELP, A1GS, A2GS, BETS, BETA2SER, GAMS, MSPIKE, SPEI (this displays SPEP labs)  No results found for: KPAFRELGTCHN, LAMBDASER, KAPLAMBRATIO (kappa/lambda light chains)  No results found for: HGBA, HGBA2QUANT, HGBFQUANT, HGBSQUAN (Hemoglobinopathy evaluation)   No results found for: LDH  No results found for: IRON, TIBC, IRONPCTSAT (Iron and TIBC)  No results found for: FERRITIN  Urinalysis    Component Value Date/Time   COLORURINE STRAW (A) 03/26/2017 [place] 03/26/2017 0724   LABSPEC 1.012 03/26/2017 0724   PHURINE 7.0 03/26/2017 0724   GLUCOSEU NEGATIVE 03/26/2017 0724   HGBUR SMALL (A) 03/26/2017 0724   BILIRUBINUR NEGATIVE 03/26/2017 0724   KETONESUR NEGATIVE 03/26/2017 0724   PROTEINUR NEGATIVE 03/26/2017 0724   NITRITE NEGATIVE 03/26/2017 0724   LEUKOCYTESUR NEGATIVE 03/26/2017 0724    STUDIES: No results found.   ELIGIBLE FOR AVAILABLE RESEARCH PROTOCOL: no   ASSESSMENT: 68 y.o. [place], [place] woman status post right breast upper outer quadrant biopsy 07/19/2017 for a clinical T1c NX, stage IA invasive ductal carcinoma, grade 2, estrogen and progesterone receptor positive, HER-2 not amplified, with an MIB-1 of 10%  (a) 1 of 4 suspicious axillary lymph nodes biopsied 07/19/2017 was negative  (1) status post right lumpectomy and sentinel lymph node sampling 08/23/2017  for a pT1c pN0, stage IA invasive ductal carcinoma, grade 2, with negative margins  (a) total of 5 sentinel lymph nodes removed  (2) The Oncotype DX score was 12, predicting a risk of outside the breast recurrence over the next 9 years of 3% if the patient's only systemic therapy is tamoxifen for 5 years.  It also predicts no significant benefit from chemotherapy.   (3) adjuvant radiation completed 09/27/2017 - 10/24/2017  Site/dose: The patient initially received a dose of 42.56 Gy in 16 fractions to the breast using whole-breast tangent fields. This was delivered using a 3-D conformal technique. The patient then received a boost to the seroma. This delivered an additional 8 Gy in 4 fractions using a 3 field photon technique due to the depth of the seroma. The total dose was 50.56 Gy.  (4) anastrozole started 11/25/2017  (a) density 05/10/2016 showed a T score of -1.9  (b) repeat DEXA scan 06/29/2018 showed a T score of -2.3  (c) alendronate/Fosamax started 08/02/2018  (5) history of acute right lower lobe pulmonary embolus 03/19/2016; bilateral Dopplers lower extremity same day were clear  (a) status post apixaban x6 months   PLAN: Embrie is now a year out from definitive surgery for her right-sided breast cancer.  There is no evidence of disease recurrence.  This is favorable.  She is tolerating anastrozole generally well, except for problems with hot flashes.  She does have vaginal dryness but she did not benefit from visits to our pelvic floor rehab clinic.  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  This is a benign unpleasant condition.  She is going to try rubbing, ice, heat, and stretching to see if she can find some relief.  She was reassured that this is not related to breast cancer  She does have significant osteopenia with a T score of -2.3.  We discussed bisphosphonates including Reclast and denosumab including Prolia.  She has a good understanding of the possible toxicities side effects and complications of these agents including hypocalcemia, bone pain, and the rare case of osteonecrosis of the jaw.  After much discussion she decided she would like to get Fosamax to try.  I [name] her instructions on how to take this medication safely.  She understands if she does not  take it as prescribed it could worsen her reflux issues.  She will see me again in 6 months.  If all is going well at that time I will start seeing her yearly from there.  She knows to call for any other issues that may develop before the next visit.  Callee Rohrig, Melissa Dad, MD  08/02/18 9:53 AM Medical Oncology and Hematology [facility] [address] [place], [place] [address] Tel. [phone]    Fax. [phone]   I, Wilburn Mylar, am acting as scribe for Dr. Virgie [name]. Catera Hankins.  I, Lurline Del MD, have reviewed the above documentation for accuracy and completeness, and I agree with the above.

## 2018-08-02 ENCOUNTER — Inpatient Hospital Stay: Payer: Medicare Other

## 2018-08-02 ENCOUNTER — Other Ambulatory Visit: Payer: Self-pay

## 2018-08-02 ENCOUNTER — Inpatient Hospital Stay: Payer: Medicare Other | Attending: Oncology | Admitting: Oncology

## 2018-08-02 VITALS — BP 132/56 | HR 76 | Temp 98.2°F | Resp 18 | Ht 64.0 in | Wt 168.7 lb

## 2018-08-02 DIAGNOSIS — Z170 Estrogen receptor positive status [ER+]: Secondary | ICD-10-CM | POA: Diagnosis not present

## 2018-08-02 DIAGNOSIS — Z87891 Personal history of nicotine dependence: Secondary | ICD-10-CM | POA: Diagnosis not present

## 2018-08-02 DIAGNOSIS — F419 Anxiety disorder, unspecified: Secondary | ICD-10-CM | POA: Insufficient documentation

## 2018-08-02 DIAGNOSIS — R232 Flushing: Secondary | ICD-10-CM | POA: Insufficient documentation

## 2018-08-02 DIAGNOSIS — K219 Gastro-esophageal reflux disease without esophagitis: Secondary | ICD-10-CM | POA: Diagnosis not present

## 2018-08-02 DIAGNOSIS — R0789 Other chest pain: Secondary | ICD-10-CM

## 2018-08-02 DIAGNOSIS — M8580 Other specified disorders of bone density and structure, unspecified site: Secondary | ICD-10-CM

## 2018-08-02 DIAGNOSIS — C50411 Malignant neoplasm of upper-outer quadrant of right female breast: Secondary | ICD-10-CM

## 2018-08-02 DIAGNOSIS — Z86711 Personal history of pulmonary embolism: Secondary | ICD-10-CM | POA: Diagnosis not present

## 2018-08-02 DIAGNOSIS — Z8249 Family history of ischemic heart disease and other diseases of the circulatory system: Secondary | ICD-10-CM | POA: Insufficient documentation

## 2018-08-02 DIAGNOSIS — Z79899 Other long term (current) drug therapy: Secondary | ICD-10-CM

## 2018-08-02 DIAGNOSIS — Z923 Personal history of irradiation: Secondary | ICD-10-CM | POA: Diagnosis not present

## 2018-08-02 DIAGNOSIS — E7800 Pure hypercholesterolemia, unspecified: Secondary | ICD-10-CM | POA: Diagnosis not present

## 2018-08-02 DIAGNOSIS — I2690 Septic pulmonary embolism without acute cor pulmonale: Secondary | ICD-10-CM

## 2018-08-02 DIAGNOSIS — N644 Mastodynia: Secondary | ICD-10-CM | POA: Diagnosis not present

## 2018-08-02 DIAGNOSIS — Z79811 Long term (current) use of aromatase inhibitors: Secondary | ICD-10-CM | POA: Insufficient documentation

## 2018-08-02 DIAGNOSIS — M1990 Unspecified osteoarthritis, unspecified site: Secondary | ICD-10-CM | POA: Insufficient documentation

## 2018-08-02 LAB — CMP (CANCER CENTER ONLY)
ALT: 18 U/L (ref 0–44)
AST: 23 U/L (ref 15–41)
Albumin: 3.9 g/dL (ref 3.5–5.0)
Alkaline Phosphatase: 79 U/L (ref 38–126)
Anion gap: 10 (ref 5–15)
BUN: 15 mg/dL (ref 8–23)
CO2: 26 mmol/L (ref 22–32)
Calcium: 9.2 mg/dL (ref 8.9–10.3)
Chloride: 104 mmol/L (ref 98–111)
Creatinine: 0.85 mg/dL (ref 0.44–1.00)
GFR, Est AFR Am: >60 mL/min (ref >60)
GFR, Estimated: >60 mL/min (ref >60)
Glucose, Bld: 95 mg/dL (ref 70–99)
Potassium: 3.7 mmol/L (ref 3.5–5.1)
Sodium: 140 mmol/L (ref 135–145)
Total Bilirubin: 0.5 mg/dL (ref 0.3–1.2)
Total Protein: 7.5 g/dL (ref 6.5–8.1)

## 2018-08-02 LAB — CBC WITH DIFFERENTIAL (CANCER CENTER ONLY)
Abs Immature Granulocytes: 0.02 10*3/uL (ref 0.00–0.07)
Basophils Absolute: 0.1 10*3/uL (ref 0.0–0.1)
Basophils Relative: 1 %
Eosinophils Absolute: 0.1 10*3/uL (ref 0.0–0.5)
Eosinophils Relative: 2 %
HCT: 40.8 % (ref 36.0–46.0)
Hemoglobin: 13.5 g/dL (ref 12.0–15.0)
Immature Granulocytes: 0 %
Lymphocytes Relative: 32 %
Lymphs Abs: 2 10*3/uL (ref 0.7–4.0)
MCH: 31.2 pg (ref 26.0–34.0)
MCHC: 33.1 g/dL (ref 30.0–36.0)
MCV: 94.2 fL (ref 80.0–100.0)
Monocytes Absolute: 0.6 10*3/uL (ref 0.1–1.0)
Monocytes Relative: 10 %
Neutro Abs: 3.3 10*3/uL (ref 1.7–7.7)
Neutrophils Relative %: 55 %
Platelet Count: 251 10*3/uL (ref 150–400)
RBC: 4.33 MIL/uL (ref 3.87–5.11)
RDW: 12.6 % (ref 11.5–15.5)
WBC Count: 6.1 10*3/uL (ref 4.0–10.5)
nRBC: 0 % (ref 0.0–0.2)

## 2018-08-02 MED ORDER — ALENDRONATE SODIUM 70 MG PO TABS: 70.0000 mg | ORAL_TABLET | ORAL | 4 refills | Status: DC

## 2018-08-03 ENCOUNTER — Telehealth: Payer: Self-pay | Admitting: Oncology

## 2018-08-03 NOTE — Telephone Encounter (Signed)
I talk with patient regarding schedule  

## 2018-10-07 ENCOUNTER — Emergency Department (HOSPITAL_BASED_OUTPATIENT_CLINIC_OR_DEPARTMENT_OTHER): Payer: Medicare Other

## 2018-10-07 ENCOUNTER — Emergency Department (HOSPITAL_BASED_OUTPATIENT_CLINIC_OR_DEPARTMENT_OTHER)
Admission: EM | Admit: 2018-10-07 | Discharge: 2018-10-07 | Disposition: A | Discharge status: Home / Self Care | Payer: Medicare Other | Attending: Emergency Medicine | Admitting: Emergency Medicine

## 2018-10-07 ENCOUNTER — Other Ambulatory Visit: Payer: Self-pay

## 2018-10-07 ENCOUNTER — Encounter (HOSPITAL_BASED_OUTPATIENT_CLINIC_OR_DEPARTMENT_OTHER): Payer: Self-pay | Admitting: *Deleted

## 2018-10-07 DIAGNOSIS — Z87891 Personal history of nicotine dependence: Secondary | ICD-10-CM | POA: Insufficient documentation

## 2018-10-07 DIAGNOSIS — Z853 Personal history of malignant neoplasm of breast: Secondary | ICD-10-CM | POA: Diagnosis not present

## 2018-10-07 DIAGNOSIS — E876 Hypokalemia: Secondary | ICD-10-CM | POA: Insufficient documentation

## 2018-10-07 DIAGNOSIS — Z79899 Other long term (current) drug therapy: Secondary | ICD-10-CM | POA: Insufficient documentation

## 2018-10-07 DIAGNOSIS — R450 Nervousness: Secondary | ICD-10-CM | POA: Diagnosis not present

## 2018-10-07 LAB — COMPREHENSIVE METABOLIC PANEL
ALT: 26 U/L (ref 0–44)
AST: 26 U/L (ref 15–41)
Albumin: 4.3 g/dL (ref 3.5–5.0)
Alkaline Phosphatase: 67 U/L (ref 38–126)
Anion gap: 13 (ref 5–15)
BUN: 13 mg/dL (ref 8–23)
CO2: 24 mmol/L (ref 22–32)
Calcium: 9.7 mg/dL (ref 8.9–10.3)
Chloride: 103 mmol/L (ref 98–111)
Creatinine, Ser: 0.75 mg/dL (ref 0.44–1.00)
GFR calc Af Amer: >60 mL/min (ref >60)
GFR calc non Af Amer: >60 mL/min (ref >60)
Glucose, Bld: 117 mg/dL — ABNORMAL HIGH (ref 70–99)
Potassium: 3.2 mmol/L — ABNORMAL LOW (ref 3.5–5.1)
Sodium: 140 mmol/L (ref 135–145)
Total Bilirubin: 0.6 mg/dL (ref 0.3–1.2)
Total Protein: 7.6 g/dL (ref 6.5–8.1)

## 2018-10-07 LAB — CBC WITH DIFFERENTIAL/PLATELET
Abs Immature Granulocytes: 0.02 10*3/uL (ref 0.00–0.07)
Basophils Absolute: 0 10*3/uL (ref 0.0–0.1)
Basophils Relative: 1 %
Eosinophils Absolute: 0.1 10*3/uL (ref 0.0–0.5)
Eosinophils Relative: 1 %
HCT: 42.5 % (ref 36.0–46.0)
Hemoglobin: 14 g/dL (ref 12.0–15.0)
Immature Granulocytes: 0 %
Lymphocytes Relative: 25 %
Lymphs Abs: 1.8 10*3/uL (ref 0.7–4.0)
MCH: 31.5 pg (ref 26.0–34.0)
MCHC: 32.9 g/dL (ref 30.0–36.0)
MCV: 95.7 fL (ref 80.0–100.0)
Monocytes Absolute: 0.5 10*3/uL (ref 0.1–1.0)
Monocytes Relative: 7 %
Neutro Abs: 5 10*3/uL (ref 1.7–7.7)
Neutrophils Relative %: 66 %
Platelets: 260 10*3/uL (ref 150–400)
RBC: 4.44 MIL/uL (ref 3.87–5.11)
RDW: 12.5 % (ref 11.5–15.5)
WBC: 7.4 10*3/uL (ref 4.0–10.5)
nRBC: 0 % (ref 0.0–0.2)

## 2018-10-07 LAB — URINALYSIS, ROUTINE W REFLEX MICROSCOPIC
Bilirubin Urine: NEGATIVE
Glucose, UA: NEGATIVE mg/dL
Ketones, ur: NEGATIVE mg/dL
Nitrite: NEGATIVE
Protein, ur: NEGATIVE mg/dL
Specific Gravity, Urine: 1.01 (ref 1.005–1.030)
pH: 7 (ref 5.0–8.0)

## 2018-10-07 LAB — URINALYSIS, MICROSCOPIC (REFLEX)

## 2018-10-07 LAB — TSH: TSH: 1.674 u[IU]/mL (ref 0.350–4.500)

## 2018-10-07 LAB — MAGNESIUM: Magnesium: 1.8 mg/dL (ref 1.7–2.4)

## 2018-10-07 MED ORDER — POTASSIUM CHLORIDE CRYS ER 20 MEQ PO TBCR
40.0000 meq | EXTENDED_RELEASE_TABLET | Freq: Once | ORAL | Status: AC
  Administered 2018-10-07: 11:00:00 40 meq via ORAL
  Filled 2018-10-07: qty 2

## 2018-10-07 NOTE — ED Triage Notes (Signed)
Woke up this morning at 0100 trembling for no reason.

## 2018-10-07 NOTE — Discharge Instructions (Addendum)
You were seen in the emergency department for feeling jittery and tremulous.  You had blood work EKG chest x-ray and urinalysis that did not show any serious findings.  Your potassium was mildly low here and we gave you an extra dose of potassium.  Please follow-up with your doctor as you may end up needing further testing.  Return to the emergency department if any concerns.

## 2018-10-07 NOTE — ED Provider Notes (Signed)
[facility] EMERGENCY DEPARTMENT Provider Note   CSN: [id number] Arrival date & time: 10/07/18  [id number]     History   Chief Complaint Chief Complaint  Patient presents with  . Jittery    HPI Melissa [name] is a 68 y.o. female.  She has history of breast cancer and remote PE.  She is also been dealing with some digestive problems and has intermittent upper abdominal pain and cramps.  She is here with a complaint of 3 or 4 days of intermittent trembling and feeling weak in her legs.  Unclear if this is chills or she just feels shaky.  She said she has had some sinus congestion and a little bit of nausea.  No cough or chest pain.  Intermittent upper abdominal pain that is followed by GI.  No urinary symptoms.  No recent changes in her meds.  She does admit to being under a lot of stress.  Denies any fevers     The history is provided by the patient.  Weakness Severity:  Moderate Onset quality:  Gradual Timing:  Intermittent Progression:  Unchanged Chronicity:  New Context: stress   Context: not change in medication, not recent infection and not urinary tract infection   Relieved by:  Nothing Worsened by:  Nothing Ineffective treatments:  None tried Associated symptoms: abdominal pain and nausea   Associated symptoms: no aphasia, no chest pain, no cough, no dysuria, no falls, no fever, no foul-smelling urine, no headaches, no sensory-motor deficit, no shortness of breath, no syncope and no vision change     Past Medical History:  Diagnosis Date  . Anxiety   . Arthritis    shoulders, hands  . Cancer ([place]) 07/2017   right breast cancer  . Chest pain   . Diverticulosis   . GERD (gastroesophageal reflux disease)   . High cholesterol   . IBS (irritable bowel syndrome)   . Kidney disorder    Pt states only has left kidney  . Panic attacks   . Pulmonary embolism ([place]) 2018   pt states resolved and no longer takes blood thinners    Patient Active Problem List   Diagnosis Date Noted  . Pulmonary embolism ([place]) 07/27/2017  . Osteopenia 07/27/2017  . Malignant neoplasm of upper-outer quadrant of right breast in female, estrogen receptor positive ([place]) 07/22/2017  . Pure hypercholesterolemia 07/14/2017  . Palpitations 04/18/2017  . Shortness of breath 04/18/2017  . Atypical chest pain 03/26/2017  . Leukocytosis 03/26/2017  . Infected tooth 03/26/2017  . Panic attacks   . GERD (gastroesophageal reflux disease)   . Diverticulosis   . Anxiety   . Kidney disorder     Past Surgical History:  Procedure Laterality Date  . BREAST LUMPECTOMY WITH RADIOACTIVE SEED AND SENTINEL LYMPH NODE BIOPSY Right 08/23/2017   Procedure: BREAST LUMPECTOMY WITH RADIOACTIVE SEED AND SENTINEL LYMPH NODE BIOPSY;  Surgeon: Erroll Luna, MD;  Location: [facility];  Service: General;  Laterality: Right;  . TUBAL LIGATION       OB History   No obstetric history on file.      Home Medications    Prior to Admission medications   Medication Sig Start Date End Date Taking? Authorizing Provider  acetaminophen (TYLENOL) 500 MG tablet Take 1,000 mg by mouth every 6 (six) hours as needed for mild pain.    [provider]  alendronate (FOSAMAX) 70 MG tablet Take 1 tablet (70 mg total) by mouth once a week. Take with a full  glass of water on an empty stomach. 08/02/18   Magrinat, Virgie Dad, MD  ALPRAZolam Duanne Moron) 0.25 MG tablet 3 (three) times daily. TAKE 1/2 TABLET IN THE MORNING AND 1/2 IN THE EVENING AT BEDTIME    [provider]  anastrozole (ARIMIDEX) 1 MG tablet Take 1 tablet (1 mg total) by mouth daily. 10/27/17   Magrinat, Virgie Dad, MD  cholecalciferol 2000 units tablet Take 0.5 tablets (1,000 Units total) by mouth daily. 10/27/17   Magrinat, Virgie Dad, MD  hydrocortisone cream 1 % Apply 1 application topically as needed for itching.    [provider]  hyoscyamine (ANASPAZ) 0.125 MG TBDP disintergrating tablet Take 0.125 mg by mouth  every 6 (six) hours as needed. 01/27/17   [provider]  omeprazole (PRILOSEC) 20 MG capsule Take 20 mg by mouth 2 (two) times daily before a meal.    [provider]    Family History Family History  Problem Relation Age of Onset  . Alzheimer's disease Mother   . Stroke Father 61  . Heart attack Maternal Grandmother   . Heart attack Paternal Grandmother   . Cervical cancer Cousin   . Colon cancer Paternal Aunt   . Colon cancer Cousin     Social History Social History   Tobacco Use  . Smoking status: Former Smoker    Packs/day: 20.00    Years: 1.00    Pack years: 20.00    Types: Cigarettes    Quit date: 1985    Years since quitting: 35.7  . Smokeless tobacco: Never Used  Substance Use Topics  . Alcohol use: Yes    Frequency: Never    Comment: social  . Drug use: Never     Allergies   Clindamycin   Review of Systems Review of Systems  Constitutional: Positive for chills (??). Negative for fever.  HENT: Positive for sinus pressure. Negative for sore throat.   Eyes: Negative for visual disturbance.  Respiratory: Negative for cough and shortness of breath.   Cardiovascular: Negative for chest pain and syncope.  Gastrointestinal: Positive for abdominal pain and nausea.  Genitourinary: Negative for dysuria.  Musculoskeletal: Negative for falls and neck pain.  Skin: Negative for rash.  Neurological: Positive for weakness. Negative for headaches.     Physical Exam Updated Vital Signs BP (!) 151/87 (BP Location: Left Arm)   Pulse 88   Temp 98.5 F (36.9 C) (Oral)   Resp 18   SpO2 99%   Physical Exam Vitals signs and nursing note reviewed.  Constitutional:      General: She is not in acute distress.    Appearance: She is well-developed.  HENT:     Head: Normocephalic and atraumatic.  Eyes:     Conjunctiva/sclera: Conjunctivae normal.  Neck:     Musculoskeletal: Neck supple.  Cardiovascular:     Rate and Rhythm: Normal rate and  regular rhythm.     Heart sounds: No murmur.  Pulmonary:     Effort: Pulmonary effort is normal. No respiratory distress.     Breath sounds: Normal breath sounds.  Abdominal:     Palpations: Abdomen is soft.     Tenderness: There is no abdominal tenderness.  Musculoskeletal: Normal range of motion.     Right lower leg: No edema.     Left lower leg: No edema.  Skin:    General: Skin is warm and dry.     Capillary Refill: Capillary refill takes less than 2 seconds.  Neurological:  General: No focal deficit present.     Mental Status: She is alert and oriented to person, place, and time.     Sensory: No sensory deficit.     Motor: No weakness.     Gait: Gait normal.      ED Treatments / Results  Labs (all labs ordered are listed, but only abnormal results are displayed) Labs Reviewed  COMPREHENSIVE METABOLIC PANEL - Abnormal; Notable for the following components:      Result Value   Potassium 3.2 (*)    Glucose, Bld 117 (*)    All other components within normal limits  URINALYSIS, ROUTINE W REFLEX MICROSCOPIC - Abnormal; Notable for the following components:   Color, Urine STRAW (*)    Hgb urine dipstick SMALL (*)    Leukocytes,Ua SMALL (*)    All other components within normal limits  URINALYSIS, MICROSCOPIC (REFLEX) - Abnormal; Notable for the following components:   Bacteria, UA FEW (*)    All other components within normal limits  CBC WITH DIFFERENTIAL/PLATELET  MAGNESIUM  TSH    EKG EKG Interpretation  Date/Time:  Saturday October 07 2018 10:04:47 EDT Ventricular Rate:  85 PR Interval:    QRS Duration: 90 QT Interval:  384 QTC Calculation: 457 R Axis:   -8 Text Interpretation:  Sinus rhythm Baseline wander in lead(s) V2 similar to prior 3/19 Confirmed by Aletta Edouard [phone]) on 10/07/2018 10:23:10 AM   Radiology Dg Chest Port 1 View  Result Date: 10/07/2018 CLINICAL DATA:  68 year old female with history of weakness. EXAM: PORTABLE CHEST 1 VIEW  COMPARISON:  Chest x-ray 03/26/2017. FINDINGS: Lung volumes are normal. No consolidative airspace disease. No pleural effusions. No pneumothorax. No pulmonary nodule or mass noted. Pulmonary vasculature and the cardiomediastinal silhouette are within normal limits. Atherosclerotic calcifications in the thoracic aorta. IMPRESSION: 1.  No radiographic evidence of acute cardiopulmonary disease. 2. Aortic atherosclerosis. Electronically Signed   By: Vinnie Langton M.D.   On: 10/07/2018 10:45    Procedures Procedures (including critical care time)  Medications Ordered in ED Medications  potassium chloride SA (K-DUR) CR tablet 40 mEq (40 mEq Oral Given 10/07/18 1116)     Initial Impression / Assessment and Plan / ED Course  I have reviewed the triage vital signs and the nursing notes.  Pertinent labs & imaging results that were available during my care of the patient were reviewed by me and considered in my medical decision making (see chart for details).  Clinical Course as of Oct 07 1643  Sat Oct 06, 4824  7170 68 year old female here with feeling jittery shaky and generally weak in her legs.  Vitals show her to be slightly hypertensive but otherwise afebrile satting 99%.  She is a benign physical exam.  Differential includes infectious, metabolic derangement, anxiety, endocrine, ACS.  EKG is benign.  Awaiting further testing to result.  Chest x-ray reviewed by me and I do not see an obvious infiltrate.  Awaiting radiology reading.   [MB]  1052 Patient's lab work so far CBC CMP magnesium are only significant for a potassium level of 3.2.  We will orally replete that.  TSH and urinalysis not resulted.   [MB]    Clinical Course User Index [MB] Hayden Rasmussen, MD   Siller Maller was evaluated in Emergency Department on 10/07/2018 for the symptoms described in the history of present illness. She was evaluated in the context of the global COVID-19 pandemic, which necessitated consideration that  the patient might be at  risk for infection with the SARS-CoV-2 virus that causes COVID-19. Institutional protocols and algorithms that pertain to the evaluation of patients at risk for COVID-19 are in a state of rapid change based on information released by regulatory bodies including the CDC and federal and state organizations. These policies and algorithms were followed during the patient's care in the ED.      Final Clinical Impressions(s) / ED Diagnoses   Final diagnoses:  Jittery feeling  Hypokalemia    ED Discharge Orders    None       Hayden Rasmussen, MD 10/07/18 1646

## 2018-10-07 NOTE — ED Notes (Signed)
ED Provider at bedside. 

## 2018-11-01 ENCOUNTER — Telehealth: Payer: Self-pay

## 2018-11-01 NOTE — Telephone Encounter (Signed)
TC from Pt in reference to Fosamax, Pt stated that she was prescribed  Fosamax by  Dr. Jana Hakim on 9/30 she stated that she took 2 pills and discontinued taking medication because she had experienced acid reflux and digestive issues (cramps and frequent bowel movements) Pt stated she suffers from Irritable bowel syndrome and that was a little too much for her. Pt also has some concerns that the medication can cause kidney disease and she is worried because she only has one kidney. Informed Pt that's why we do blood work when she is taking any medication to make sure her kidney functions are normal.  Asked Pt if she would like to change medication and come in to see Dr Jana Hakim earlier then scheduled appointment she was a little apprehensive and stated she would keep her appointment on 11/9 and would discuss these issues   on her appointment day. Informed Pt if she changed her mind she can give a call back.

## 2018-11-10 ENCOUNTER — Other Ambulatory Visit: Payer: Self-pay

## 2018-11-10 ENCOUNTER — Encounter (HOSPITAL_BASED_OUTPATIENT_CLINIC_OR_DEPARTMENT_OTHER): Payer: Self-pay | Admitting: *Deleted

## 2018-11-10 ENCOUNTER — Emergency Department (HOSPITAL_BASED_OUTPATIENT_CLINIC_OR_DEPARTMENT_OTHER)
Admission: EM | Admit: 2018-11-10 | Discharge: 2018-11-10 | Disposition: A | Discharge status: Home / Self Care | Payer: Medicare Other | Attending: Emergency Medicine | Admitting: Emergency Medicine

## 2018-11-10 ENCOUNTER — Emergency Department (HOSPITAL_BASED_OUTPATIENT_CLINIC_OR_DEPARTMENT_OTHER): Payer: Medicare Other

## 2018-11-10 DIAGNOSIS — Z87891 Personal history of nicotine dependence: Secondary | ICD-10-CM | POA: Insufficient documentation

## 2018-11-10 DIAGNOSIS — R1013 Epigastric pain: Secondary | ICD-10-CM | POA: Diagnosis not present

## 2018-11-10 DIAGNOSIS — R109 Unspecified abdominal pain: Secondary | ICD-10-CM

## 2018-11-10 DIAGNOSIS — R1010 Upper abdominal pain, unspecified: Secondary | ICD-10-CM | POA: Diagnosis present

## 2018-11-10 DIAGNOSIS — K5900 Constipation, unspecified: Secondary | ICD-10-CM | POA: Insufficient documentation

## 2018-11-10 DIAGNOSIS — Z79899 Other long term (current) drug therapy: Secondary | ICD-10-CM | POA: Insufficient documentation

## 2018-11-10 LAB — CBC WITH DIFFERENTIAL/PLATELET
Abs Immature Granulocytes: 0.01 10*3/uL (ref 0.00–0.07)
Basophils Absolute: 0.1 10*3/uL (ref 0.0–0.1)
Basophils Relative: 1 %
Eosinophils Absolute: 0 10*3/uL (ref 0.0–0.5)
Eosinophils Relative: 0 %
HCT: 43.8 % (ref 36.0–46.0)
Hemoglobin: 14.4 g/dL (ref 12.0–15.0)
Immature Granulocytes: 0 %
Lymphocytes Relative: 21 %
Lymphs Abs: 1.5 10*3/uL (ref 0.7–4.0)
MCH: 31.4 pg (ref 26.0–34.0)
MCHC: 32.9 g/dL (ref 30.0–36.0)
MCV: 95.6 fL (ref 80.0–100.0)
Monocytes Absolute: 0.6 10*3/uL (ref 0.1–1.0)
Monocytes Relative: 8 %
Neutro Abs: 5.1 10*3/uL (ref 1.7–7.7)
Neutrophils Relative %: 70 %
Platelets: 277 10*3/uL (ref 150–400)
RBC: 4.58 MIL/uL (ref 3.87–5.11)
RDW: 12.1 % (ref 11.5–15.5)
WBC: 7.3 10*3/uL (ref 4.0–10.5)
nRBC: 0 % (ref 0.0–0.2)

## 2018-11-10 LAB — URINALYSIS, ROUTINE W REFLEX MICROSCOPIC
Bilirubin Urine: NEGATIVE
Glucose, UA: NEGATIVE mg/dL
Ketones, ur: NEGATIVE mg/dL
Leukocytes,Ua: NEGATIVE
Nitrite: NEGATIVE
Protein, ur: NEGATIVE mg/dL
Specific Gravity, Urine: <1.005 — ABNORMAL LOW (ref 1.005–1.030)
pH: 7 (ref 5.0–8.0)

## 2018-11-10 LAB — URINALYSIS, MICROSCOPIC (REFLEX)
Squamous Epithelial / HPF: NONE SEEN (ref 0–5)
WBC, UA: NONE SEEN WBC/hpf (ref 0–5)

## 2018-11-10 LAB — COMPREHENSIVE METABOLIC PANEL
ALT: 16 U/L (ref 0–44)
AST: 20 U/L (ref 15–41)
Albumin: 4.4 g/dL (ref 3.5–5.0)
Alkaline Phosphatase: 68 U/L (ref 38–126)
Anion gap: 10 (ref 5–15)
BUN: 15 mg/dL (ref 8–23)
CO2: 25 mmol/L (ref 22–32)
Calcium: 9.9 mg/dL (ref 8.9–10.3)
Chloride: 102 mmol/L (ref 98–111)
Creatinine, Ser: 0.77 mg/dL (ref 0.44–1.00)
GFR calc Af Amer: >60 mL/min (ref >60)
GFR calc non Af Amer: >60 mL/min (ref >60)
Glucose, Bld: 114 mg/dL — ABNORMAL HIGH (ref 70–99)
Potassium: 3.7 mmol/L (ref 3.5–5.1)
Sodium: 137 mmol/L (ref 135–145)
Total Bilirubin: 0.9 mg/dL (ref 0.3–1.2)
Total Protein: 7.6 g/dL (ref 6.5–8.1)

## 2018-11-10 LAB — LIPASE, BLOOD: Lipase: 34 U/L (ref 11–51)

## 2018-11-10 LAB — D-DIMER, QUANTITATIVE: D-Dimer, Quant: 1.15 ug/mL-FEU — ABNORMAL HIGH (ref 0.00–0.50)

## 2018-11-10 MED ORDER — DICYCLOMINE HCL 20 MG PO TABS: 20.0000 mg | ORAL_TABLET | Freq: Two times a day (BID) | ORAL | 0 refills | Status: DC

## 2018-11-10 MED ORDER — ONDANSETRON HCL 4 MG/2ML IJ SOLN
4.0000 mg | Freq: Once | INTRAMUSCULAR | Status: AC
  Administered 2018-11-10: 4 mg via INTRAVENOUS
  Filled 2018-11-10: qty 2

## 2018-11-10 MED ORDER — FAMOTIDINE IN NACL 20-0.9 MG/50ML-% IV SOLN: 20.0000 mg | Freq: Once | INTRAVENOUS | Status: DC

## 2018-11-10 MED ORDER — CIMETIDINE 200 MG PO TABS: 400.0000 mg | ORAL_TABLET | Freq: Every day | ORAL | 0 refills | Status: DC

## 2018-11-10 MED ORDER — SODIUM CHLORIDE 0.9 % IV BOLUS
1000.0000 mL | Freq: Once | INTRAVENOUS | Status: AC
  Administered 2018-11-10: 1000 mL via INTRAVENOUS

## 2018-11-10 MED ORDER — PANTOPRAZOLE SODIUM 40 MG IV SOLR
40.0000 mg | Freq: Once | INTRAVENOUS | Status: AC
  Administered 2018-11-10: 40 mg via INTRAVENOUS
  Filled 2018-11-10: qty 40

## 2018-11-10 MED ORDER — IOHEXOL 350 MG/ML SOLN
100.0000 mL | Freq: Once | INTRAVENOUS | Status: AC | PRN
  Administered 2018-11-10: 80 mL via INTRAVENOUS

## 2018-11-10 NOTE — ED Provider Notes (Signed)
[facility] EMERGENCY DEPARTMENT Provider Note   CSN: [id number] Arrival date & time: 11/10/18  1027     History   Chief Complaint Chief Complaint  Patient presents with   Abdominal Pain    HPI Melissa [name] is a 68 y.o. female.     HPI   Pt is a 68 y/o female with a h/o right breast CA, diverticulosis, GERD, HLD, IBS, VTE, atrophic right kidney, who presents to the ED today for eval of abd pain ongoing for 1 week.  Pain is located to the bilateral upper abdomen.  Pain is intermittent.  It seems to be worse with bowel movements and after eating.  Feels a burning pain.  She also has pain to the right flank.  She has had some nausea and intermittent constipation but [place] any diarrhea.  She has been taking Benefiber and Metamucil at home.  She denies any chest pain, shortness of breath or pleuritic pain.  She denies any fevers.  Denies dysuria, frequency, urgency.  She has noticed that she has had worse acid reflux recently.  She is on omeprazole.  Past Medical History:  Diagnosis Date   Anxiety    Arthritis    shoulders, hands   Cancer ([place]) 07/2017   right breast cancer   Chest pain    Diverticulosis    GERD (gastroesophageal reflux disease)    High cholesterol    IBS (irritable bowel syndrome)    Kidney disorder    Pt states only has left kidney   Panic attacks    Pulmonary embolism ([place]) 2018   pt states resolved and no longer takes blood thinners    Patient Active Problem List   Diagnosis Date Noted   Pulmonary embolism ([place]) 07/27/2017   Osteopenia 07/27/2017   Malignant neoplasm of upper-outer quadrant of right breast in female, estrogen receptor positive ([place]) 07/22/2017   Pure hypercholesterolemia 07/14/2017   Palpitations 04/18/2017   Shortness of breath 04/18/2017   Atypical chest pain 03/26/2017   Leukocytosis 03/26/2017   Infected tooth 03/26/2017   Panic attacks    GERD (gastroesophageal reflux disease)     Diverticulosis    Anxiety    Kidney disorder     Past Surgical History:  Procedure Laterality Date   BREAST LUMPECTOMY WITH RADIOACTIVE SEED AND SENTINEL LYMPH NODE BIOPSY Right 08/23/2017   Procedure: BREAST LUMPECTOMY WITH RADIOACTIVE SEED AND SENTINEL LYMPH NODE BIOPSY;  Surgeon: Erroll Luna, MD;  Location: [facility];  Service: General;  Laterality: Right;   TUBAL LIGATION       OB History    Gravida  2   Para  1   Term      Preterm      AB  1   Living  1     SAB      TAB      Ectopic      Multiple      Live Births               Home Medications    Prior to Admission medications   Medication Sig Start Date End Date Taking? Authorizing Provider  acetaminophen (TYLENOL) 500 MG tablet Take 1,000 mg by mouth every 6 (six) hours as needed for mild pain.   Yes [provider]  ALPRAZolam (XANAX) 0.25 MG tablet 3 (three) times daily. TAKE 1/2 TABLET IN THE MORNING AND 1/2 IN THE EVENING AT BEDTIME   Yes [provider]  anastrozole (ARIMIDEX) 1  MG tablet Take 1 tablet (1 mg total) by mouth daily. 10/27/17  Yes Magrinat, Virgie Dad, MD  cholecalciferol 2000 units tablet Take 0.5 tablets (1,000 Units total) by mouth daily. 10/27/17  Yes Magrinat, Virgie Dad, MD  hyoscyamine (ANASPAZ) 0.125 MG TBDP disintergrating tablet Take 0.125 mg by mouth every 6 (six) hours as needed. 01/27/17  Yes [provider]  omeprazole (PRILOSEC) 20 MG capsule Take 20 mg by mouth 2 (two) times daily before a meal.   Yes [provider]  cimetidine (CIMETIDINE 200) 200 MG tablet Take 2 tablets (400 mg total) by mouth at bedtime for 14 days. 11/10/18 11/24/18  Tiffanye Hartmann S, PA-C  dicyclomine (BENTYL) 20 MG tablet Take 1 tablet (20 mg total) by mouth 2 (two) times daily for 14 days. 11/10/18 11/24/18  Aitana Burry S, PA-C  hydrocortisone cream 1 % Apply 1 application topically as needed for itching.    [provider]     Family History Family History  Problem Relation Age of Onset   Alzheimer's disease Mother    Stroke Father 2   Heart attack Maternal Grandmother    Heart attack Paternal Grandmother    Cervical cancer Cousin    Colon cancer Paternal Aunt    Colon cancer Cousin     Social History Social History   Tobacco Use   Smoking status: Former Smoker    Packs/day: 20.00    Years: 1.00    Pack years: 20.00    Types: Cigarettes    Quit date: 1985    Years since quitting: 35.8   Smokeless tobacco: Never Used  Substance Use Topics   Alcohol use: Yes    Frequency: Never    Comment: social   Drug use: Never     Allergies   Clindamycin   Review of Systems Review of Systems  Constitutional: Negative for chills and fever.  HENT: Negative for ear pain and sore throat.   Eyes: Negative for visual disturbance.  Respiratory: Negative for cough and shortness of breath.   Cardiovascular: Negative for chest pain.  Gastrointestinal: Positive for abdominal pain, constipation and nausea. Negative for diarrhea and vomiting.  Genitourinary: Positive for flank pain. Negative for dysuria, hematuria and urgency.  Musculoskeletal: Negative for back pain.  Skin: Negative for rash.  Neurological: Negative for headaches.  All other systems reviewed and are negative.    Physical Exam Updated Vital Signs BP 140/68 (BP Location: Left Arm)    Pulse 94    Temp 98.4 F (36.9 C) (Oral)    Resp 20    Ht 5\' 3"  (1.6 m)    Wt 76.2 kg    SpO2 95%    BMI 29.76 kg/m   Physical Exam Vitals signs and nursing note reviewed.  Constitutional:      General: She is not in acute distress.    Appearance: She is well-developed.  HENT:     Head: Normocephalic and atraumatic.  Eyes:     Conjunctiva/sclera: Conjunctivae normal.  Neck:     Musculoskeletal: Neck supple.  Cardiovascular:     Rate and Rhythm: Normal rate and regular rhythm.     Heart sounds: Normal heart sounds. No murmur.   Pulmonary:     Effort: Pulmonary effort is normal. No respiratory distress.     Breath sounds: Normal breath sounds. No wheezing, rhonchi or rales.  Chest:     Chest wall: No tenderness.  Abdominal:     General: Bowel sounds are normal.  Palpations: Abdomen is soft.     Tenderness: There is abdominal tenderness in the right upper quadrant and epigastric area. There is guarding. There is no right CVA tenderness, left CVA tenderness or rebound.  Skin:    General: Skin is warm and dry.  Neurological:     Mental Status: She is alert.      ED Treatments / Results  Labs (all labs ordered are listed, but only abnormal results are displayed) Labs Reviewed  COMPREHENSIVE METABOLIC PANEL - Abnormal; Notable for the following components:      Result Value   Glucose, Bld 114 (*)    All other components within normal limits  URINALYSIS, ROUTINE W REFLEX MICROSCOPIC - Abnormal; Notable for the following components:   Color, Urine STRAW (*)    Specific Gravity, Urine <1.005 (*)    Hgb urine dipstick TRACE (*)    All other components within normal limits  D-DIMER, QUANTITATIVE (NOT AT [facility]) - Abnormal; Notable for the following components:   D-Dimer, Quant 1.15 (*)    All other components within normal limits  URINALYSIS, MICROSCOPIC (REFLEX) - Abnormal; Notable for the following components:   Bacteria, UA RARE (*)    All other components within normal limits  CBC WITH DIFFERENTIAL/PLATELET  LIPASE, BLOOD    EKG None  Radiology Ct Angio Chest Pe W And/or Wo Contrast  Result Date: 11/10/2018 CLINICAL DATA:  68 year old female with acute chest, abdominal and pelvic pain with elevated D-dimer. History of breast cancer. EXAM: CT ANGIOGRAPHY CHEST CT ABDOMEN AND PELVIS WITH CONTRAST TECHNIQUE: Multidetector CT imaging of the chest was performed using the standard protocol during bolus administration of intravenous contrast. Multiplanar CT image reconstructions and MIPs were obtained to  evaluate the vascular anatomy. Multidetector CT imaging of the abdomen and pelvis was performed using the standard protocol during bolus administration of intravenous contrast. CONTRAST:  24mL OMNIPAQUE IOHEXOL 350 MG/ML SOLN COMPARISON:  None. FINDINGS: CTA CHEST FINDINGS Cardiovascular: This is a technically adequate study. No pulmonary emboli are identified. Mild thoracic aortic atherosclerotic calcifications noted without evidence of thoracic aortic aneurysm or dissection. Normal heart size. No pericardial effusion. Mediastinum/Nodes: No enlarged mediastinal, hilar, or axillary lymph nodes. Thyroid gland, trachea, and esophagus demonstrate no significant findings. Lungs/Pleura: Mild RIGHT basilar atelectasis and lingular atelectasis/scar noted. No airspace disease, pleural effusion, pulmonary mass, nodule, consolidation or pneumothorax. Musculoskeletal: No acute or suspicious bony abnormalities. RIGHT lumpectomy changes noted. Review of the MIP images confirms the above findings. CT ABDOMEN and PELVIS FINDINGS Hepatobiliary: The liver and gallbladder are unremarkable. No biliary dilatation. Pancreas: Unremarkable Spleen: Unremarkable Adrenals/Urinary Tract: Extremely atrophic RIGHT kidney noted. No LEFT renal abnormalities identified. No evidence of hydronephrosis or obstructing urinary calculi. The adrenal glands and bladder are unremarkable. Stomach/Bowel: Stomach is within normal limits. Appendix appears normal. No evidence of bowel wall thickening, distention, or inflammatory changes. Vascular/Lymphatic: Aortic atherosclerosis. No enlarged abdominal or pelvic lymph nodes. Reproductive: Uterus and bilateral adnexa are unremarkable. Other: No ascites, focal collection or pneumoperitoneum. Musculoskeletal: No acute or suspicious bony abnormalities identified. Bilateral L5 pars defects with grade 2 anterolisthesis of L5 on S1 noted. Severe degenerative disc disease at L5-S1 and L1-2 noted. Review of the MIP  images confirms the above findings. IMPRESSION: 1. No evidence of pulmonary emboli or aortic aneurysm/dissection. 2. Mild RIGHT basilar and lingular atelectasis/scarring. 3. Extremely atrophic RIGHT kidney. 4. Bilateral L5 pars defects with grade 2 anterolisthesis of L5 on S1. Severe degenerative disc disease at L1-2 and L5-S1. 5.  Aortic  Atherosclerosis (ICD10-I70.0). Electronically Signed   By: Margarette Canada M.D.   On: 11/10/2018 13:50   Ct Abdomen Pelvis W Contrast  Result Date: 11/10/2018 CLINICAL DATA:  68 year old female with acute chest, abdominal and pelvic pain with elevated D-dimer. History of breast cancer. EXAM: CT ANGIOGRAPHY CHEST CT ABDOMEN AND PELVIS WITH CONTRAST TECHNIQUE: Multidetector CT imaging of the chest was performed using the standard protocol during bolus administration of intravenous contrast. Multiplanar CT image reconstructions and MIPs were obtained to evaluate the vascular anatomy. Multidetector CT imaging of the abdomen and pelvis was performed using the standard protocol during bolus administration of intravenous contrast. CONTRAST:  71mL OMNIPAQUE IOHEXOL 350 MG/ML SOLN COMPARISON:  None. FINDINGS: CTA CHEST FINDINGS Cardiovascular: This is a technically adequate study. No pulmonary emboli are identified. Mild thoracic aortic atherosclerotic calcifications noted without evidence of thoracic aortic aneurysm or dissection. Normal heart size. No pericardial effusion. Mediastinum/Nodes: No enlarged mediastinal, hilar, or axillary lymph nodes. Thyroid gland, trachea, and esophagus demonstrate no significant findings. Lungs/Pleura: Mild RIGHT basilar atelectasis and lingular atelectasis/scar noted. No airspace disease, pleural effusion, pulmonary mass, nodule, consolidation or pneumothorax. Musculoskeletal: No acute or suspicious bony abnormalities. RIGHT lumpectomy changes noted. Review of the MIP images confirms the above findings. CT ABDOMEN and PELVIS FINDINGS Hepatobiliary: The  liver and gallbladder are unremarkable. No biliary dilatation. Pancreas: Unremarkable Spleen: Unremarkable Adrenals/Urinary Tract: Extremely atrophic RIGHT kidney noted. No LEFT renal abnormalities identified. No evidence of hydronephrosis or obstructing urinary calculi. The adrenal glands and bladder are unremarkable. Stomach/Bowel: Stomach is within normal limits. Appendix appears normal. No evidence of bowel wall thickening, distention, or inflammatory changes. Vascular/Lymphatic: Aortic atherosclerosis. No enlarged abdominal or pelvic lymph nodes. Reproductive: Uterus and bilateral adnexa are unremarkable. Other: No ascites, focal collection or pneumoperitoneum. Musculoskeletal: No acute or suspicious bony abnormalities identified. Bilateral L5 pars defects with grade 2 anterolisthesis of L5 on S1 noted. Severe degenerative disc disease at L5-S1 and L1-2 noted. Review of the MIP images confirms the above findings. IMPRESSION: 1. No evidence of pulmonary emboli or aortic aneurysm/dissection. 2. Mild RIGHT basilar and lingular atelectasis/scarring. 3. Extremely atrophic RIGHT kidney. 4. Bilateral L5 pars defects with grade 2 anterolisthesis of L5 on S1. Severe degenerative disc disease at L1-2 and L5-S1. 5.  Aortic Atherosclerosis (ICD10-I70.0). Electronically Signed   By: Margarette Canada M.D.   On: 11/10/2018 13:50    Procedures Procedures (including critical care time)  Medications Ordered in ED Medications  sodium chloride 0.9 % bolus 1,000 mL (1,000 mLs Intravenous New Bag/Given 11/10/18 1211)  ondansetron (ZOFRAN) injection 4 mg (4 mg Intravenous Given 11/10/18 1222)  pantoprazole (PROTONIX) injection 40 mg (40 mg Intravenous Given 11/10/18 1222)  iohexol (OMNIPAQUE) 350 MG/ML injection 100 mL (80 mLs Intravenous Contrast Given 11/10/18 1309)     Initial Impression / Assessment and Plan / ED Course  I have reviewed the triage vital signs and the nursing notes.  Pertinent labs & imaging results  that were available during my care of the patient were reviewed by me and considered in my medical decision making (see chart for details).   Final Clinical Impressions(s) / ED Diagnoses   Final diagnoses:  Abdominal pain, unspecified abdominal location   68 year old female with history of diverticulosis, VTE, who presents the emergency department today for evaluation of epigastric/right upper quadrant abdominal pain and right flank pain ongoing for a week.  CBC without leukocytosis or anemia CMP nonacute Lipase negative D-dimer positive, will proceed with CTA PE given right flank pain and  h/o VTE UA with hematuria, no evidence for infection   CTA chest without evidence of PE or aortic aneurysm/dissection.  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 CT abd/pelvis without acute findings, she does have right atrophic kidney and degenerative changes to the lumbar spine.  Also with aortic atherosclerosis.  Patient given fluids, Zofran, antacids, and on reassessment she feels improved after medications.  I discussed results of CT chest/abdomen/pelvis.  Discussed that I will start her on H2 blocker and Bentyl to help with her symptoms.  Advised she follow-up with her gastroenterologist for further evaluation return to the ED for new or worsening symptoms.  She voiced understanding of plan and reasons to return.  All questions answered.  Patient stable discharge.   ED Discharge Orders         Ordered    cimetidine (CIMETIDINE 200) 200 MG tablet  Daily at bedtime     11/10/18 1439    dicyclomine (BENTYL) 20 MG tablet  2 times daily     11/10/18 1439           Mikyle Sox S, PA-C 11/10/18 [place], [place], DO 11/10/18 1455

## 2018-11-10 NOTE — ED Triage Notes (Signed)
Bilateral upper intermittent abdominal pain for a week but is worst on the RUQ radiating to her left flank and lower back.  Goes to the bathroom but only small, soft stool is coming out.

## 2018-11-10 NOTE — Discharge Instructions (Addendum)
Take medication as prescribed   Call you GI doctor to make an appointment  Please follow up with your primary doctor within the next 5-7 days.  If you do not have a primary care provider, information for a healthcare clinic has been provided for you to make arrangements for follow up care. Please return to the ER sooner if you have any new or worsening symptoms, or if you have any of the following symptoms:  Abdominal pain that does not go away.  You have a fever.  You keep throwing up (vomiting).  The pain is felt only in portions of the abdomen. Pain in the right side could possibly be appendicitis. In an adult, pain in the left lower portion of the abdomen could be colitis or diverticulitis.  You pass bloody or black tarry stools.  There is bright red blood in the stool.  The constipation stays for more than 4 days.  There is belly (abdominal) or rectal pain.  You do not seem to be getting better.  You have any questions or concerns.

## 2018-11-10 NOTE — ED Notes (Signed)
ED Provider at bedside. 

## 2018-12-02 NOTE — Progress Notes (Signed)
[place]  [phone]) [phone] [phone]     ID: Melissa [name] DOB: Feb 23, 1950  MR#: [id number]  SAY#:[id number]  Patient Care Team: [facility] as PCP - General (Family Medicine) End, Harrell Gave, MD as PCP - Cardiology (Cardiology) Erroll Luna, MD as Consulting Physician (General Surgery) Oluwaseyi Raffel, Virgie Dad, MD as Consulting Physician (Oncology) Kyung Rudd, MD as Consulting Physician (Radiation Oncology) Christain Sacramento, MD as Consulting Physician (Family Medicine) Aletha Halim., PA-C as Physician Assistant (Family Medicine) Richmond Campbell, MD as Consulting Physician (Gastroenterology) Delice Bison, Charlestine Massed, NP as Nurse Practitioner (Hematology and Oncology) OTHER MD:    CHIEF COMPLAINT: Estrogen receptor positive breast cancer  CURRENT TREATMENT: Anastrozole   INTERVAL HISTORY: Melissa [name] returns today for follow up of her estrogen receptor positive breast cancer  She continues on anastrozole.  She has mild to moderate occasional hot flashes, sometimes worse at night.  Most recent bone density at [facility] obtained 06/29/2018 showed a T score of -2.3.  She was on anastrozole but found that that caused her increased reflux problems and discontinued the medication.  Since her last visit, she presented to urgent care on 10/07/2018 with jittery feeling and generalized weakness. Chest x-ray and electrocardiogram performed at that time showed no acute problems and she was told it was likely anxiety.  She presented to the ED again on 11/10/2018 with acute chest, abdominal, and pelvic pain with an elevated D-dimer. Angio chest, abdomen, pelvis CT performed at that time showed: no evidence of pulmonary emboli or aortic aneurysm; mild right basilar and lingular atelectasis/scarring; extremely atrophic right kidney; bilateral L5 pars defects with grade 2 anterolisthesis of L5 on S1, severe degenerative disc disease at L1-2 and  L5-S1.  Her most recent mammography was performed at [facility] on 07/12/2018.   REVIEW OF SYSTEMS: Melissa [name] tells me she has always been anxious.  She has a history of panic attacks although she has not had these for quite a while.  When she really feels anxious she takes a half a Xanax.  She does this less than once a week.  For exercise she tries to walk a couple of miles a day with her dog.  She is taking appropriate pandemic precautions.  A detailed review of systems today was otherwise stable.   HISTORY OF CURRENT ILLNESS: From the original intake note:  Melissa [name] had routine screening bilateral mammography with tomography at [facility] on 07/12/2017 showing; breast density category C. There was a possible mass in the right breast. On 07/14/2017 she completed unilateral right ultrasonography at [facility] that showed an irregular hypoechoic 1.6 cm mass in the right upper outer quadrant and posterior depth. Evaluation of the right axilla showed 4 lymph nodes with cortical thickening suspicious of malignancy.   Accordingly on 07/19/2017 she proceeded to biopsy of the right breast area and 1 lymph node in question. 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  The lymph node was biopsied showing no evidence of carcinoma, which was found to be concordant.  Prognostic indicators significant for: estrogen receptor, 95% positive and progesterone receptor, 95% positive, both with strong staining intensity. Proliferation marker Ki67 at 10% . HER2 not amplified with ratios HER2/CEP17 signals 1.22 and average HER2 copies per cell 1.95  The patient's subsequent history is as detailed below.   PAST MEDICAL HISTORY: Past Medical History:  Diagnosis Date   Anxiety    Arthritis    shoulders, hands  Cancer ([place]) 07/2017   right breast cancer   Chest pain    Diverticulosis    GERD (gastroesophageal reflux  disease)    High cholesterol    IBS (irritable bowel syndrome)    Kidney disorder    Pt states only has left kidney   Panic attacks    Pulmonary embolism ([place]) 2018   pt states resolved and no longer takes blood thinners  Small pulmonary emboli - eliquis for 6 months- resolved.  Depressive and anxiety moods - xanax One functioning Kidney   PAST SURGICAL HISTORY: Past Surgical History:  Procedure Laterality Date   BREAST LUMPECTOMY WITH RADIOACTIVE SEED AND SENTINEL LYMPH NODE BIOPSY Right 08/23/2017   Procedure: BREAST LUMPECTOMY WITH RADIOACTIVE SEED AND SENTINEL LYMPH NODE BIOPSY;  Surgeon: Erroll Luna, MD;  Location: [place];  Service: General;  Laterality: Right;   TUBAL LIGATION      FAMILY HISTORY Family History  Problem Relation Age of Onset   Alzheimer's disease Mother    Stroke Father 26   Heart attack Maternal Grandmother    Heart attack Paternal Grandmother    Cervical cancer Cousin    Colon cancer Paternal Aunt    Colon cancer Cousin    The patient's father died at age 54 due to CHF. The patient's mother died at age 74 due to Alzheimer's. The patient has 1 brother and 4 sisters. There was a paternal 1st cousin diagnosed with cervical cancer at age 12. There was a paternal aunt diagnosed with colon cancer at age 45. There was a paternal 1st cousin diagnosed with colon cancer at age 56. The patient denies a family history of breast or ovarian cancer.     GYNECOLOGIC HISTORY:  No LMP recorded. Patient is postmenopausal. Menarche: 68 years old Age at first live birth: 68 years old She is GX P1. Her LMP was in 2008. She used oral contraception with no complications. She did not used HRT.     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 Her husband, [name], died in 2016/11/27 due to pancreatic cancer. He was a Furniture conservator/restorer. She lives alone with her 50 year old dog, a Web designer. The  patient's daughter, Melissa [name] lives in [place] and works at [employer]. Melissa [name] has two blood grandchildren and two step-grandchildren.   ADVANCED DIRECTIVES: The patient plans to name her daughter, Melissa [name] as her HCPOA. The appropriate forms were given at the 07/27/2017 visit.    HEALTH MAINTENANCE: Social History   Tobacco Use   Smoking status: Former Smoker    Packs/day: 20.00    Years: 1.00    Pack years: 20.00    Types: Cigarettes    Quit date: 1985    Years since quitting: 35.8   Smokeless tobacco: Never Used  Substance Use Topics   Alcohol use: Yes    Frequency: Never    Comment: social   Drug use: Never    Colonoscopy: Feb. 2019/ [facility]/ diverticulosis  PAP: Nov. 2018  Bone density: Nov. 2018 / osteopenia   Allergies  Allergen Reactions   Clindamycin Diarrhea and Nausea Only    Current Outpatient Medications  Medication Sig Dispense Refill   acetaminophen (TYLENOL) 500 MG tablet Take 1,000 mg by mouth every 6 (six) hours as needed for mild pain.     ALPRAZolam (XANAX) 0.25 MG tablet 3 (three) times daily. TAKE 1/2 TABLET IN THE MORNING AND 1/2 IN THE EVENING AT BEDTIME  anastrozole (ARIMIDEX) 1 MG tablet Take 1 tablet (1 mg total) by mouth daily. 90 tablet 12   cholecalciferol 2000 units tablet Take 0.5 tablets (1,000 Units total) by mouth daily.     cimetidine (CIMETIDINE 200) 200 MG tablet Take 2 tablets (400 mg total) by mouth at bedtime for 14 days. 28 tablet 0   dicyclomine (BENTYL) 20 MG tablet Take 1 tablet (20 mg total) by mouth 2 (two) times daily for 14 days. 28 tablet 0   hydrocortisone cream 1 % Apply 1 application topically as needed for itching.     hyoscyamine (ANASPAZ) 0.125 MG TBDP disintergrating tablet Take 0.125 mg by mouth every 6 (six) hours as needed.  3   omeprazole (PRILOSEC) 20 MG capsule Take 20 mg by mouth 2 (two) times daily before a meal.     No current facility-administered medications for this visit.      OBJECTIVE: Middle-aged white woman who appears stated age  34:   12/04/18 1359  BP: 117/60  Pulse: 79  Resp: 18  Temp: 98.7 F (37.1 C)  SpO2: 100%     Body mass index is 29.74 kg/m.   Wt Readings from Last 3 Encounters:  12/04/18 167 lb 14.4 oz (76.2 kg)  11/10/18 168 lb (76.2 kg)  10/07/18 169 lb (76.7 kg)      ECOG FS:1 - Symptomatic but completely ambulatory  Sclerae unicteric, EOMs intact Wearing a mask No cervical or supraclavicular adenopathy Lungs no rales or rhonchi Heart regular rate and rhythm Abd soft, nontender, positive bowel sounds MSK no focal spinal tenderness, no upper extremity lymphedema Neuro: nonfocal, well oriented, appropriate affect Breasts: The right breast has undergone lumpectomy followed by radiation, with no evidence of disease recurrence.  The cosmetic result is good.  Left breast is benign.  Both axillae are benign.   LAB RESULTS:  CMP     Component Value Date/Time   NA 141 12/04/2018 1337   K 3.7 12/04/2018 1337   CL 104 12/04/2018 1337   CO2 27 12/04/2018 1337   GLUCOSE 95 12/04/2018 1337   BUN 18 12/04/2018 1337   CREATININE 0.85 12/04/2018 1337   CREATININE 0.85 08/02/2018 0904   CALCIUM 9.6 12/04/2018 1337   PROT 7.5 12/04/2018 1337   ALBUMIN 4.1 12/04/2018 1337   AST 20 12/04/2018 1337   AST 23 08/02/2018 0904   ALT 17 12/04/2018 1337   ALT 18 08/02/2018 0904   ALKPHOS 70 12/04/2018 1337   BILITOT 0.4 12/04/2018 1337   BILITOT 0.5 08/02/2018 0904   GFRNONAA >60 12/04/2018 1337   GFRNONAA >60 08/02/2018 0904   GFRAA >60 12/04/2018 1337   GFRAA >60 08/02/2018 0904    No results found for: TOTALPROTELP, ALBUMINELP, A1GS, A2GS, BETS, BETA2SER, GAMS, MSPIKE, SPEI  No results found for: KPAFRELGTCHN, LAMBDASER, KAPLAMBRATIO  Lab Results  Component Value Date   WBC 7.5 12/04/2018   NEUTROABS 4.1 12/04/2018   HGB 13.3 12/04/2018   HCT 40.2 12/04/2018   MCV 95.0 12/04/2018   PLT 259 12/04/2018     @LASTCHEMISTRY @  No results found for: LABCA2  No components found for: YQMGNO037  No results for input(s): INR in the last 168 hours.  No results found for: LABCA2  No results found for: CWU889  No results found for: VQX450  No results found for: TUU828  No results found for: CA2729  No components found for: HGQUANT  No results found for: CEA1 / No results found for: CEA1   No results  found for: AFPTUMOR  No results found for: [place]  No results found for: PSA1  Appointment on 12/04/2018  Component Date Value Ref Range Status   Sodium 12/04/2018 141  135 - 145 mmol/L Final   Potassium 12/04/2018 3.7  3.5 - 5.1 mmol/L Final   Chloride 12/04/2018 104  98 - 111 mmol/L Final   CO2 12/04/2018 27  22 - 32 mmol/L Final   Glucose, Bld 12/04/2018 95  70 - 99 mg/dL Final   BUN 12/04/2018 18  8 - 23 mg/dL Final   Creatinine, Ser 12/04/2018 0.85  0.44 - 1.00 mg/dL Final   Calcium 12/04/2018 9.6  8.9 - 10.3 mg/dL Final   Total Protein 12/04/2018 7.5  6.5 - 8.1 g/dL Final   Albumin 12/04/2018 4.1  3.5 - 5.0 g/dL Final   AST 12/04/2018 20  15 - 41 U/L Final   ALT 12/04/2018 17  0 - 44 U/L Final   Alkaline Phosphatase 12/04/2018 70  38 - 126 U/L Final   Total Bilirubin 12/04/2018 0.4  0.3 - 1.2 mg/dL Final   GFR calc non Af Amer 12/04/2018 >60  >60 mL/min Final   GFR calc Af Amer 12/04/2018 >60  >60 mL/min Final   Anion gap 12/04/2018 10  5 - 15 Final   Performed at [facility], [place] [address] [place], [place] [address]   WBC 12/04/2018 7.5  4.0 - 10.5 K/uL Final   RBC 12/04/2018 4.23  3.87 - 5.11 MIL/uL Final   Hemoglobin 12/04/2018 13.3  12.0 - 15.0 g/dL Final   HCT 12/04/2018 40.2  36.0 - 46.0 % Final   MCV 12/04/2018 95.0  80.0 - 100.0 fL Final   MCH 12/04/2018 31.4  26.0 - 34.0 pg Final   MCHC 12/04/2018 33.1  30.0 - 36.0 g/dL Final   RDW 12/04/2018 12.6  11.5 - 15.5 % Final   Platelets 12/04/2018 259  150 -  400 K/uL Final   nRBC 12/04/2018 0.0  0.0 - 0.2 % Final   Neutrophils Relative % 12/04/2018 55  % Final   Neutro Abs 12/04/2018 4.1  1.7 - 7.7 K/uL Final   Lymphocytes Relative 12/04/2018 33  % Final   Lymphs Abs 12/04/2018 2.5  0.7 - 4.0 K/uL Final   Monocytes Relative 12/04/2018 9  % Final   Monocytes Absolute 12/04/2018 0.7  0.1 - 1.0 K/uL Final   Eosinophils Relative 12/04/2018 2  % Final   Eosinophils Absolute 12/04/2018 0.2  0.0 - 0.5 K/uL Final   Basophils Relative 12/04/2018 1  % Final   Basophils Absolute 12/04/2018 0.1  0.0 - 0.1 K/uL Final   Immature Granulocytes 12/04/2018 0  % Final   Abs Immature Granulocytes 12/04/2018 0.03  0.00 - 0.07 K/uL Final   Performed at [facility], [address] [place], [place] [address]    (this displays the last labs from the last 3 days)  No results found for: TOTALPROTELP, ALBUMINELP, A1GS, A2GS, BETS, BETA2SER, GAMS, MSPIKE, SPEI (this displays SPEP labs)  No results found for: KPAFRELGTCHN, LAMBDASER, KAPLAMBRATIO (kappa/lambda light chains)  No results found for: HGBA, HGBA2QUANT, HGBFQUANT, HGBSQUAN (Hemoglobinopathy evaluation)   No results found for: LDH  No results found for: IRON, TIBC, IRONPCTSAT (Iron and TIBC)  No results found for: FERRITIN  Urinalysis    Component Value Date/Time   COLORURINE STRAW (A) 11/10/2018 [place] 11/10/2018 1242   [place] <1.005 (L) 11/10/2018 [place]  7.0 11/10/2018 1242   GLUCOSEU NEGATIVE 11/10/2018 1242   HGBUR TRACE (A) 11/10/2018 1242   BILIRUBINUR NEGATIVE 11/10/2018 1242   [place] 11/10/2018 1242   PROTEINUR NEGATIVE 11/10/2018 1242   NITRITE NEGATIVE 11/10/2018 1242   LEUKOCYTESUR NEGATIVE 11/10/2018 1242    STUDIES: Ct Angio Chest Pe W And/or Wo Contrast  Result Date: 11/10/2018 CLINICAL DATA:  68 year old female with acute chest, abdominal and pelvic pain with elevated D-dimer. History of  breast cancer. EXAM: CT ANGIOGRAPHY CHEST CT ABDOMEN AND PELVIS WITH CONTRAST TECHNIQUE: Multidetector CT imaging of the chest was performed using the standard protocol during bolus administration of intravenous contrast. Multiplanar CT image reconstructions and MIPs were obtained to evaluate the vascular anatomy. Multidetector CT imaging of the abdomen and pelvis was performed using the standard protocol during bolus administration of intravenous contrast. CONTRAST:  23m OMNIPAQUE IOHEXOL 350 MG/ML SOLN COMPARISON:  None. FINDINGS: CTA CHEST FINDINGS Cardiovascular: This is a technically adequate study. No pulmonary emboli are identified. Mild thoracic aortic atherosclerotic calcifications noted without evidence of thoracic aortic aneurysm or dissection. Normal heart size. No pericardial effusion. Mediastinum/Nodes: No enlarged mediastinal, hilar, or axillary lymph nodes. Thyroid gland, trachea, and esophagus demonstrate no significant findings. Lungs/Pleura: Mild RIGHT basilar atelectasis and lingular atelectasis/scar noted. No airspace disease, pleural effusion, pulmonary mass, nodule, consolidation or pneumothorax. Musculoskeletal: No acute or suspicious bony abnormalities. RIGHT lumpectomy changes noted. Review of the MIP images confirms the above findings. CT ABDOMEN and PELVIS FINDINGS Hepatobiliary: The liver and gallbladder are unremarkable. No biliary dilatation. Pancreas: Unremarkable Spleen: Unremarkable Adrenals/Urinary Tract: Extremely atrophic RIGHT kidney noted. No LEFT renal abnormalities identified. No evidence of hydronephrosis or obstructing urinary calculi. The adrenal glands and bladder are unremarkable. Stomach/Bowel: Stomach is within normal limits. Appendix appears normal. No evidence of bowel wall thickening, distention, or inflammatory changes. Vascular/Lymphatic: Aortic atherosclerosis. No enlarged abdominal or pelvic lymph nodes. Reproductive: Uterus and bilateral adnexa are  unremarkable. Other: No ascites, focal collection or pneumoperitoneum. Musculoskeletal: No acute or suspicious bony abnormalities identified. Bilateral L5 pars defects with grade 2 anterolisthesis of L5 on S1 noted. Severe degenerative disc disease at L5-S1 and L1-2 noted. Review of the MIP images confirms the above findings. IMPRESSION: 1. No evidence of pulmonary emboli or aortic aneurysm/dissection. 2. Mild RIGHT basilar and lingular atelectasis/scarring. 3. Extremely atrophic RIGHT kidney. 4. Bilateral L5 pars defects with grade 2 anterolisthesis of L5 on S1. Severe degenerative disc disease at L1-2 and L5-S1. 5.  Aortic Atherosclerosis (ICD10-I70.0). Electronically Signed   By: JMargarette CanadaM.D.   On: 11/10/2018 13:50   Ct Abdomen Pelvis W Contrast  Result Date: 11/10/2018 CLINICAL DATA:  68year old female with acute chest, abdominal and pelvic pain with elevated D-dimer. History of breast cancer. EXAM: CT ANGIOGRAPHY CHEST CT ABDOMEN AND PELVIS WITH CONTRAST TECHNIQUE: Multidetector CT imaging of the chest was performed using the standard protocol during bolus administration of intravenous contrast. Multiplanar CT image reconstructions and MIPs were obtained to evaluate the vascular anatomy. Multidetector CT imaging of the abdomen and pelvis was performed using the standard protocol during bolus administration of intravenous contrast. CONTRAST:  874mOMNIPAQUE IOHEXOL 350 MG/ML SOLN COMPARISON:  None. FINDINGS: CTA CHEST FINDINGS Cardiovascular: This is a technically adequate study. No pulmonary emboli are identified. Mild thoracic aortic atherosclerotic calcifications noted without evidence of thoracic aortic aneurysm or dissection. Normal heart size. No pericardial effusion. Mediastinum/Nodes: No enlarged mediastinal, hilar, or axillary lymph nodes. Thyroid gland, trachea, and esophagus demonstrate no significant findings. Lungs/Pleura: Mild RIGHT  basilar atelectasis and lingular atelectasis/scar noted.  No airspace disease, pleural effusion, pulmonary mass, nodule, consolidation or pneumothorax. Musculoskeletal: No acute or suspicious bony abnormalities. RIGHT lumpectomy changes noted. Review of the MIP images confirms the above findings. CT ABDOMEN and PELVIS FINDINGS Hepatobiliary: The liver and gallbladder are unremarkable. No biliary dilatation. Pancreas: Unremarkable Spleen: Unremarkable Adrenals/Urinary Tract: Extremely atrophic RIGHT kidney noted. No LEFT renal abnormalities identified. No evidence of hydronephrosis or obstructing urinary calculi. The adrenal glands and bladder are unremarkable. Stomach/Bowel: Stomach is within normal limits. Appendix appears normal. No evidence of bowel wall thickening, distention, or inflammatory changes. Vascular/Lymphatic: Aortic atherosclerosis. No enlarged abdominal or pelvic lymph nodes. Reproductive: Uterus and bilateral adnexa are unremarkable. Other: No ascites, focal collection or pneumoperitoneum. Musculoskeletal: No acute or suspicious bony abnormalities identified. Bilateral L5 pars defects with grade 2 anterolisthesis of L5 on S1 noted. Severe degenerative disc disease at L5-S1 and L1-2 noted. Review of the MIP images confirms the above findings. IMPRESSION: 1. No evidence of pulmonary emboli or aortic aneurysm/dissection. 2. Mild RIGHT basilar and lingular atelectasis/scarring. 3. Extremely atrophic RIGHT kidney. 4. Bilateral L5 pars defects with grade 2 anterolisthesis of L5 on S1. Severe degenerative disc disease at L1-2 and L5-S1. 5.  Aortic Atherosclerosis (ICD10-I70.0). Electronically Signed   By: Margarette Canada M.D.   On: 11/10/2018 13:50     ELIGIBLE FOR AVAILABLE RESEARCH PROTOCOL: no   ASSESSMENT: 68 y.o. [place], [place] woman status post right breast upper outer quadrant biopsy 07/19/2017 for a clinical T1c NX, stage IA invasive ductal carcinoma, grade 2, estrogen and progesterone receptor positive, HER-2 not amplified, with an MIB-1 of 10%  (a)  1 of 4 suspicious axillary lymph nodes biopsied 07/19/2017 was negative  (1) status post right lumpectomy and sentinel lymph node sampling 08/23/2017 for a pT1c pN0, stage IA invasive ductal carcinoma, grade 2, with negative margins  (a) total of 5 sentinel lymph nodes removed  (2) The Oncotype DX score was 12, predicting a risk of outside the breast recurrence over the next 9 years of 3% if the patient's only systemic therapy is tamoxifen for 5 years.  It also predicts no significant benefit from chemotherapy.  (3) adjuvant radiation completed 09/27/2017 - 10/24/2017  Site/dose: The patient initially received a dose of 42.56 Gy in 16 fractions to the breast using whole-breast tangent fields. This was delivered using a 3-D conformal technique. The patient then received a boost to the seroma. This delivered an additional 8 Gy in 4 fractions using a 3 field photon technique due to the depth of the seroma. The total dose was 50.56 Gy.  (4) anastrozole started 11/25/2017  (a) density 05/10/2016 showed a T score of -1.9  (b) repeat DEXA scan 06/29/2018 showed a T score of -2.3  (c) alendronate/Fosamax started 08/02/2018  (5) history of acute right lower lobe pulmonary embolus 03/19/2016; bilateral Dopplers lower extremity same day were clear  (a) status post apixaban x6 months   PLAN: Samyukta is now a little over a year out from definitive surgery for her breast cancer with no evidence of disease recurrence.  This is very favorable.  She is tolerating anastrozole well and the plan is to continue that a minimum of 5 years.  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  She tried alendronate but was not able to tolerate it.  She is aware that we have parenteral drugs that can help her bone density but she does not want to try any more medications.  She  is going to continue to take vitamin D and walk and she will have a repeat bone density in 2 years  For her anxiety issues we discussed venlafaxine  but again she does not want to take any more medications that she has to.  I suggested she try more vigorous exercise when she is having a particularly anxious time.  She wonders if she should see a renal doctor since she only has 1 kidney.  We discussed the fact that people with one kidney do fine, people donated kidney and live longer normal lives, and generally solid she hydrates herself well and avoids excessive nonsteroidals or other nephrotoxic medications I do not think she would learn anything from seeing a renal doctor.  Otherwise she will see her primary care physician in May for routine physical and she will return to see me again in November of next year  She knows to call for any other issue that may develop before then.   Lakita Sahlin, Virgie Dad, MD  12/04/18 2:25 PM Medical Oncology and Hematology [facility] [place], [place] [address] Tel. [phone]    Fax. [phone]   I, Wilburn Mylar, am acting as scribe for Dr. Virgie Dad. Genie Mirabal.  I, Lurline Del MD, have reviewed the above documentation for accuracy and completeness, and I agree with the above.

## 2018-12-04 ENCOUNTER — Inpatient Hospital Stay: Payer: Medicare Other

## 2018-12-04 ENCOUNTER — Inpatient Hospital Stay: Payer: Medicare Other | Attending: Oncology | Admitting: Oncology

## 2018-12-04 ENCOUNTER — Other Ambulatory Visit: Payer: Self-pay

## 2018-12-04 VITALS — BP 117/60 | HR 79 | Temp 98.7°F | Resp 18 | Ht 63.0 in | Wt 167.9 lb

## 2018-12-04 DIAGNOSIS — Z8049 Family history of malignant neoplasm of other genital organs: Secondary | ICD-10-CM | POA: Diagnosis not present

## 2018-12-04 DIAGNOSIS — F419 Anxiety disorder, unspecified: Secondary | ICD-10-CM | POA: Diagnosis not present

## 2018-12-04 DIAGNOSIS — Z79811 Long term (current) use of aromatase inhibitors: Secondary | ICD-10-CM | POA: Insufficient documentation

## 2018-12-04 DIAGNOSIS — Z923 Personal history of irradiation: Secondary | ICD-10-CM | POA: Diagnosis not present

## 2018-12-04 DIAGNOSIS — I700 Atherosclerosis of aorta: Secondary | ICD-10-CM | POA: Insufficient documentation

## 2018-12-04 DIAGNOSIS — Z818 Family history of other mental and behavioral disorders: Secondary | ICD-10-CM | POA: Diagnosis not present

## 2018-12-04 DIAGNOSIS — R102 Pelvic and perineal pain: Secondary | ICD-10-CM | POA: Diagnosis not present

## 2018-12-04 DIAGNOSIS — Z170 Estrogen receptor positive status [ER+]: Secondary | ICD-10-CM | POA: Diagnosis not present

## 2018-12-04 DIAGNOSIS — Z800 Family history of malignant neoplasm of digestive organs: Secondary | ICD-10-CM | POA: Diagnosis not present

## 2018-12-04 DIAGNOSIS — Z823 Family history of stroke: Secondary | ICD-10-CM | POA: Insufficient documentation

## 2018-12-04 DIAGNOSIS — R0789 Other chest pain: Secondary | ICD-10-CM

## 2018-12-04 DIAGNOSIS — C50411 Malignant neoplasm of upper-outer quadrant of right female breast: Secondary | ICD-10-CM | POA: Diagnosis not present

## 2018-12-04 DIAGNOSIS — R7989 Other specified abnormal findings of blood chemistry: Secondary | ICD-10-CM | POA: Insufficient documentation

## 2018-12-04 DIAGNOSIS — J984 Other disorders of lung: Secondary | ICD-10-CM | POA: Insufficient documentation

## 2018-12-04 DIAGNOSIS — Z79899 Other long term (current) drug therapy: Secondary | ICD-10-CM | POA: Insufficient documentation

## 2018-12-04 DIAGNOSIS — Z881 Allergy status to other antibiotic agents status: Secondary | ICD-10-CM | POA: Insufficient documentation

## 2018-12-04 DIAGNOSIS — E7800 Pure hypercholesterolemia, unspecified: Secondary | ICD-10-CM | POA: Diagnosis not present

## 2018-12-04 DIAGNOSIS — Z86711 Personal history of pulmonary embolism: Secondary | ICD-10-CM | POA: Diagnosis not present

## 2018-12-04 DIAGNOSIS — I2690 Septic pulmonary embolism without acute cor pulmonale: Secondary | ICD-10-CM

## 2018-12-04 DIAGNOSIS — M5137 Other intervertebral disc degeneration, lumbosacral region: Secondary | ICD-10-CM | POA: Diagnosis not present

## 2018-12-04 DIAGNOSIS — Z87891 Personal history of nicotine dependence: Secondary | ICD-10-CM | POA: Diagnosis not present

## 2018-12-04 DIAGNOSIS — M810 Age-related osteoporosis without current pathological fracture: Secondary | ICD-10-CM | POA: Insufficient documentation

## 2018-12-04 DIAGNOSIS — Z8249 Family history of ischemic heart disease and other diseases of the circulatory system: Secondary | ICD-10-CM | POA: Insufficient documentation

## 2018-12-04 DIAGNOSIS — M5136 Other intervertebral disc degeneration, lumbar region: Secondary | ICD-10-CM | POA: Diagnosis not present

## 2018-12-04 LAB — COMPREHENSIVE METABOLIC PANEL
ALT: 17 U/L (ref 0–44)
AST: 20 U/L (ref 15–41)
Albumin: 4.1 g/dL (ref 3.5–5.0)
Alkaline Phosphatase: 70 U/L (ref 38–126)
Anion gap: 10 (ref 5–15)
BUN: 18 mg/dL (ref 8–23)
CO2: 27 mmol/L (ref 22–32)
Calcium: 9.6 mg/dL (ref 8.9–10.3)
Chloride: 104 mmol/L (ref 98–111)
Creatinine, Ser: 0.85 mg/dL (ref 0.44–1.00)
GFR calc Af Amer: >60 mL/min (ref >60)
GFR calc non Af Amer: >60 mL/min (ref >60)
Glucose, Bld: 95 mg/dL (ref 70–99)
Potassium: 3.7 mmol/L (ref 3.5–5.1)
Sodium: 141 mmol/L (ref 135–145)
Total Bilirubin: 0.4 mg/dL (ref 0.3–1.2)
Total Protein: 7.5 g/dL (ref 6.5–8.1)

## 2018-12-04 LAB — CBC WITH DIFFERENTIAL/PLATELET
Abs Immature Granulocytes: 0.03 10*3/uL (ref 0.00–0.07)
Basophils Absolute: 0.1 10*3/uL (ref 0.0–0.1)
Basophils Relative: 1 %
Eosinophils Absolute: 0.2 10*3/uL (ref 0.0–0.5)
Eosinophils Relative: 2 %
HCT: 40.2 % (ref 36.0–46.0)
Hemoglobin: 13.3 g/dL (ref 12.0–15.0)
Immature Granulocytes: 0 %
Lymphocytes Relative: 33 %
Lymphs Abs: 2.5 10*3/uL (ref 0.7–4.0)
MCH: 31.4 pg (ref 26.0–34.0)
MCHC: 33.1 g/dL (ref 30.0–36.0)
MCV: 95 fL (ref 80.0–100.0)
Monocytes Absolute: 0.7 10*3/uL (ref 0.1–1.0)
Monocytes Relative: 9 %
Neutro Abs: 4.1 10*3/uL (ref 1.7–7.7)
Neutrophils Relative %: 55 %
Platelets: 259 10*3/uL (ref 150–400)
RBC: 4.23 MIL/uL (ref 3.87–5.11)
RDW: 12.6 % (ref 11.5–15.5)
WBC: 7.5 10*3/uL (ref 4.0–10.5)
nRBC: 0 % (ref 0.0–0.2)

## 2018-12-04 MED ORDER — ANASTROZOLE 1 MG PO TABS: 1.0000 mg | ORAL_TABLET | Freq: Every day | ORAL | 12 refills | Status: DC

## 2018-12-04 NOTE — Addendum Note (Signed)
Addended by: Chauncey Cruel on: 12/04/2018 03:55 PM   Modules accepted: Orders

## 2018-12-05 ENCOUNTER — Telehealth: Payer: Self-pay | Admitting: Oncology

## 2018-12-05 NOTE — Telephone Encounter (Signed)
I talk with patient regarding schedule  

## 2019-01-15 ENCOUNTER — Telehealth: Payer: Self-pay | Admitting: *Deleted

## 2019-01-15 NOTE — Telephone Encounter (Signed)
This RN spoke with pt per her call stating concern due to occurrence of heart palpitations yesterday that lingered. She states she took a xanax with benefit.  She is calling here due to visit last month and concerns that she first contact here for advice.  Melissa [name] states she feels because of having issues prior to visit which she went to Urgent Care and had scans " I don't think Dr Jana Hakim wanted me to do that "  Visit discussed with pt stating she overall does not remember the visit but that she feels she hurt put herself in harm with radiation exposure by obtaining a scan at Urgent Care.  This RN reassured her scan was appropriate - informed her advice regarding scans and radiation exposure is more concerning with patients who obtain them very frequently.  This RN reviewed MD visit per his dictation and her concerns - which was reassuring to her.  Note per phone conversation pt discussed issues of stress occurring over the past several years as well as current concerns.  This RN validated pt's situation and status - discussed anxiety issues and how they occur  and best to contact her primary MD for evaluation and probable need for prescription for anti depressant and appropriate follow up.  Melissa [name] stated appreciation of above discussion and she is calling her primary MD post this call.  This RN again validated plan- as well as reassured pt to call if further concerns.

## 2019-01-24 ENCOUNTER — Encounter: Payer: Self-pay | Admitting: Cardiovascular Disease

## 2019-01-24 ENCOUNTER — Other Ambulatory Visit: Payer: Self-pay

## 2019-01-24 ENCOUNTER — Ambulatory Visit: Payer: Medicare Other | Admitting: Cardiovascular Disease

## 2019-01-24 ENCOUNTER — Encounter: Payer: Self-pay | Admitting: *Deleted

## 2019-01-24 VITALS — BP 130/64 | HR 84 | Temp 97.0°F | Ht 63.0 in | Wt 174.0 lb

## 2019-01-24 DIAGNOSIS — R0789 Other chest pain: Secondary | ICD-10-CM | POA: Diagnosis not present

## 2019-01-24 DIAGNOSIS — E7800 Pure hypercholesterolemia, unspecified: Secondary | ICD-10-CM

## 2019-01-24 DIAGNOSIS — I700 Atherosclerosis of aorta: Secondary | ICD-10-CM

## 2019-01-24 DIAGNOSIS — R002 Palpitations: Secondary | ICD-10-CM | POA: Diagnosis not present

## 2019-01-24 NOTE — Patient Instructions (Signed)
Medication Instructions:  Your physician recommends that you continue on your current medications as directed. Please refer to the Current Medication list given to you today.  If you need a refill on your cardiac medications before your next appointment, please call your pharmacy.   Lab work: NONE  Testing/Procedures: Your physician has requested that you have an echocardiogram. Echocardiography is a painless test that uses sound waves to create images of your heart. It provides your doctor with information about the size and shape of your heart and how well your heart's chambers and valves are working. This procedure takes approximately one hour. There are no restrictions for this procedure. [place] [address]  AND  2 week Zio Patch  Follow-Up: At [facility], you and your health needs are our priority.  As part of our continuing mission to provide you with exceptional heart care, we have created designated Provider Care Teams.  These Care Teams include your primary Cardiologist (physician) and Advanced Practice Providers (APPs -  Physician Assistants and Nurse Practitioners) who all work together to provide you with the care you need, when you need it. You may see Dr Gwenlyn Found or one of the following Advanced Practice Providers on your designated Care Team:    Kerin Ransom, PA-C  [place], [place]  Coletta Memos, [place] Your physician wants you to follow-up in: 2 months   Any Other Special Instructions Will Be Listed Below (If Applicable).  ZIO XT- Long Term Monitor Instructions   Your physician has requested you wear your ZIO patch monitor 14 days.   This is a single patch monitor.  [employer] supplies one patch monitor per enrollment.  Additional stickers are not available.   Please do not apply patch if you will be having a Nuclear Stress Test, Echocardiogram, Cardiac CT, MRI, or Chest Xray during the time frame you would be wearing the monitor. The patch cannot be  worn during these tests.  You cannot remove and re-apply the ZIO XT patch monitor.   Your ZIO patch monitor will be sent USPS Priority mail from [facility] directly to your home address. The monitor may also be mailed to a PO BOX if home delivery is not available.   It may take 3-5 days to receive your monitor after you have been enrolled.   Once you have received you monitor, please review enclosed instructions.  Your monitor has already been registered assigning a specific monitor serial # to you.   Applying the monitor   Shave hair from upper left chest.   Hold abrader disc by orange tab.  Rub abrader in 40 strokes over left upper chest as indicated in your monitor instructions.   Clean area with 4 enclosed alcohol pads .  Use all pads to assure are is cleaned thoroughly.  Let dry.   Apply patch as indicated in monitor instructions.  Patch will be place under collarbone on left side of chest with arrow pointing upward.   Rub patch adhesive wings for 2 minutes.Remove white label marked "1".  Remove white label marked "2".  Rub patch adhesive wings for 2 additional minutes.   While looking in a mirror, press and release button in center of patch.  A small green light will flash 3-4 times .  This will be your only indicator the monitor has been turned on.     Do not shower for the first 24 hours.  You may shower after the first 24 hours.   Press button  if you feel a symptom. You will hear a small click.  Record Date, Time and Symptom in the Patient Log Book.   When you are ready to remove patch, follow instructions on last 2 pages of Patient Log Book.  Stick patch monitor onto last page of Patient Log Book.   Place Patient Log Book in [place] box.  Use locking tab on box and tape box closed securely.  The [employer] and [employer] has [employer] on it.  Please place in mailbox as soon as possible.  Your physician should have your test results approximately 7 days after the monitor  has been mailed back to [facility].   Call [place] at [phone] if you have questions regarding your ZIO XT patch monitor.  Call them immediately if you see an orange light blinking on your monitor.   If your monitor falls off in less than 4 days contact our Monitor department at [phone].  If your monitor becomes loose or falls off after 4 days call [employer] at [phone] for suggestions on securing your monitor.

## 2019-01-24 NOTE — Assessment & Plan Note (Signed)
Long history of palpitations thought to be related to panic attacks/anxiety.  He is were particularly noticeable recently when she saw her PCP on 01/16/2019 which revealed sinus rhythm with PACs.  Thyroid function tests were normal.  I am going to get a 2D echo and a 2-week Zio patch to further evaluate.

## 2019-01-24 NOTE — Progress Notes (Signed)
01/24/2019 Melissa [name]   1950-05-16  [id number]  Primary Physician Sharmaine Base, [facility] Primary Cardiologist: Lorretta Harp MD FACP, [place], [place], [place]  HPI:  Melissa [name] is a 68 y.o. moderately overweight widowed Caucasian female (husband of 39 years died of cancer in May 03, 2016), mother of 1, grandmother of 2 grandchildren referred by her PCP ([facility]) because of palpitations.  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  She has seen Dr. Saunders Revel in the past who performed routine GXT/3/19 that was entirely normal for atypical chest pain.  Risk factors include hyperlipidemia intolerant to statin therapy.  She is never had a heart attack or stroke.  There is no family history for heart disease.  She was diagnosed with breast cancer May 03, 2017 and had radiation therapy followed by Dr. Jana Hakim.  She does admit to having panic attacks and anxiety which has been more exacerbated since the loss of her husband.  She saw her PCP on 01/16/2019 which time an EKG showed sinus rhythm with PACs.  Thyroid function tests were normal.  She does not drink alcohol or caffeine.   Current Meds  Medication Sig  . acetaminophen (TYLENOL) 500 MG tablet Take 1,000 mg by mouth every 6 (six) hours as needed for mild pain.  Marland Kitchen ALPRAZolam (XANAX) 0.25 MG tablet 3 (three) times daily. TAKE 1/2 TABLET IN THE MORNING AND 1/2 IN THE EVENING AT BEDTIME  . anastrozole (ARIMIDEX) 1 MG tablet Take 1 tablet (1 mg total) by mouth daily.  . cholecalciferol 2000 units tablet Take 2,000 Units by mouth daily.   . hydrocortisone cream 1 % Apply 1 application topically as needed for itching.  . hyoscyamine (ANASPAZ) 0.125 MG TBDP disintergrating tablet Take 0.125 mg by mouth every 6 (six) hours as needed.  Marland Kitchen omeprazole (PRILOSEC) 20 MG capsule Take 20 mg by mouth 2 (two) times daily before a meal.     Allergies  Allergen Reactions  . Clindamycin Diarrhea and Nausea Only    Social  History   Socioeconomic History  . Marital status: Widowed    Spouse name: Not on file  . Number of children: Not on file  . Years of education: Not on file  . Highest education level: Not on file  Occupational History  . Not on file  Tobacco Use  . Smoking status: Former Smoker    Packs/day: 20.00    Years: 1.00    Pack years: 20.00    Types: Cigarettes    Quit date: 1985    Years since quitting: 36.0  . Smokeless tobacco: Never Used  Substance and Sexual Activity  . Alcohol use: Yes    Comment: social  . Drug use: Never  . Sexual activity: Not Currently  Other Topics Concern  . Not on file  Social History Narrative   09-13-17 Unable to ask abuse questions daughter with her today.   Social Determinants of Health   Financial Resource Strain:   . Difficulty of Paying Living Expenses: Not on file  Food Insecurity:   . Worried About Charity fundraiser in the Last Year: Not on file  . Ran Out of Food in the Last Year: Not on file  Transportation Needs:   . Lack of Transportation (Medical): Not on file  . Lack of Transportation (Non-Medical): Not on file  Physical Activity:   . Days of Exercise per Week: Not on file  . Minutes of Exercise per Session: Not on file  Stress:   . Feeling of Stress : Not on file  Social Connections:   . Frequency of Communication with Friends and Family: Not on file  . Frequency of Social Gatherings with Friends and Family: Not on file  . Attends Religious Services: Not on file  . Active Member of Clubs or Organizations: Not on file  . Attends Archivist Meetings: Not on file  . Marital Status: Not on file  Intimate Partner Violence:   . Fear of Current or Ex-Partner: Not on file  . Emotionally Abused: Not on file  . Physically Abused: Not on file  . Sexually Abused: Not on file     Review of Systems: General: negative for chills, fever, night sweats or weight changes.  Cardiovascular: negative for chest pain, dyspnea on  exertion, edema, orthopnea, palpitations, paroxysmal nocturnal dyspnea or shortness of breath Dermatological: negative for rash Respiratory: negative for cough or wheezing Urologic: negative for hematuria Abdominal: negative for nausea, vomiting, diarrhea, bright red blood per rectum, melena, or hematemesis Neurologic: negative for visual changes, syncope, or dizziness All other systems reviewed and are otherwise negative except as noted above.    Blood pressure 130/64, pulse 84, temperature (!) 97 F (36.1 C), height 5\' 3"  (1.6 m), weight 174 lb (78.9 kg).  General appearance: alert and no distress Neck: no adenopathy, no carotid bruit, no JVD, supple, symmetrical, trachea midline and thyroid not enlarged, symmetric, no tenderness/mass/nodules Lungs: clear to auscultation bilaterally Heart: regular rate and rhythm, S1, S2 normal, no murmur, click, rub or gallop Extremities: extremities normal, atraumatic, no cyanosis or edema Pulses: 2+ and symmetric Skin: Skin color, texture, turgor normal. No rashes or lesions Neurologic: Alert and oriented X 3, normal strength and tone. Normal symmetric reflexes. Normal coordination and gait  EKG not performed today  ASSESSMENT AND PLAN:   Palpitations Long history of palpitations thought to be related to panic attacks/anxiety.  He is were particularly noticeable recently when she saw her PCP on 01/16/2019 which revealed sinus rhythm with PACs.  Thyroid function tests were normal.  I am going to get a 2D echo and a 2-week Zio patch to further evaluate.  Aortic atherosclerosis (HCC) History of aortic atherosclerosis seen on chest CT performed 11/10/2018.  Pure hypercholesterolemia History of hyperlipidemia intolerant to statin therapy with lipid profile performed 07/10/2018 revealing total cholesterol 223, LDL of 140 and HDL of 62.  Atypical chest pain History of atypical chest pain with a negative GXT performed by Dr. Saunders Revel  04/27/2017.      Lorretta Harp MD FACP,FACC,FAHA, [facility] 01/24/2019 3:52 PM

## 2019-01-24 NOTE — Assessment & Plan Note (Signed)
History of aortic atherosclerosis seen on chest CT performed 11/10/2018.

## 2019-01-24 NOTE — Assessment & Plan Note (Signed)
History of hyperlipidemia intolerant to statin therapy with lipid profile performed 07/10/2018 revealing total cholesterol 223, LDL of 140 and HDL of 62.

## 2019-01-24 NOTE — Assessment & Plan Note (Signed)
History of atypical chest pain with a negative GXT performed by Dr. Saunders Revel 04/27/2017.

## 2019-01-24 NOTE — Progress Notes (Signed)
Patient ID: Melissa [name], female   DOB: 1950/08/05, 68 y.o.   MRN: [id number]  14 day ZIO XT long term holter monitor mailed to patients home.

## 2019-01-31 ENCOUNTER — Ambulatory Visit: Payer: Medicare Other | Admitting: Internal Medicine

## 2019-01-31 ENCOUNTER — Ambulatory Visit (HOSPITAL_COMMUNITY): Payer: Medicare Other | Attending: Cardiovascular Disease

## 2019-01-31 ENCOUNTER — Other Ambulatory Visit: Payer: Self-pay

## 2019-01-31 DIAGNOSIS — R002 Palpitations: Secondary | ICD-10-CM | POA: Diagnosis not present

## 2019-01-31 DIAGNOSIS — R0789 Other chest pain: Secondary | ICD-10-CM | POA: Diagnosis not present

## 2019-02-01 ENCOUNTER — Ambulatory Visit (INDEPENDENT_AMBULATORY_CARE_PROVIDER_SITE_OTHER): Payer: Medicare Other

## 2019-02-01 DIAGNOSIS — R0789 Other chest pain: Secondary | ICD-10-CM | POA: Diagnosis not present

## 2019-02-01 DIAGNOSIS — R002 Palpitations: Secondary | ICD-10-CM

## 2019-02-02 ENCOUNTER — Telehealth: Payer: Self-pay | Admitting: Cardiovascular Disease

## 2019-02-02 NOTE — Telephone Encounter (Signed)
New message   Patient would like a call to go over echo results.

## 2019-02-02 NOTE — Telephone Encounter (Signed)
Returned the patients call regarding her recent echocardiogram results. The patient has been notified of the result and verbalized understanding.  All questions (if any) were answered. Wilma Flavin, RN 02/02/2019 9:38 AM

## 2019-02-13 ENCOUNTER — Telehealth: Payer: Self-pay | Admitting: Cardiovascular Disease

## 2019-02-13 NOTE — Telephone Encounter (Signed)
New Message  Patient is calling in to speak with Dr. Kennon Holter nurse about her heart monitor. Patient states that it has come off. Patient is supposed to stop wearing the monitor and send it back on Thursday. Please give patient a call back to discuss.

## 2019-02-13 NOTE — Telephone Encounter (Signed)
Spoke with patient and she wanted Dr Gwenlyn Found to know monitor patch came off and that is why ended 2 days earlier. With Zio once falls off after 3 days can not be reapplied  Will forward so he will be aware

## 2019-02-15 ENCOUNTER — Ambulatory Visit: Payer: Medicare Other | Attending: Internal Medicine

## 2019-02-15 DIAGNOSIS — Z23 Encounter for immunization: Secondary | ICD-10-CM

## 2019-02-15 NOTE — Progress Notes (Signed)
   Covid-19 Vaccination Clinic  Name:  Melissa [name]    MRN: [id number] DOB: 01/11/1951  02/15/2019  Melissa [name] was observed post Covid-19 immunization for 15 minutes without incidence. She was provided with Vaccine Information Sheet and instruction to access the V-Safe system.   Melissa [name] was instructed to call 911 with any severe reactions post vaccine: Marland Kitchen Difficulty breathing  . Swelling of your face and throat  . A fast heartbeat  . A bad rash all over your body  . Dizziness and weakness    Immunizations Administered    Name Date Dose VIS Date Route   Pfizer COVID-19 Vaccine 02/15/2019  1:49 PM 0.3 mL 01/05/2019 Intramuscular   Manufacturer: [place]   Lot: [id number]   [place]: [id number]

## 2019-03-08 ENCOUNTER — Ambulatory Visit: Payer: Medicare Other | Attending: Internal Medicine

## 2019-03-08 DIAGNOSIS — Z23 Encounter for immunization: Secondary | ICD-10-CM

## 2019-03-08 NOTE — Progress Notes (Signed)
   Covid-19 Vaccination Clinic  Name:  Melissa [name]    MRN: [id number] DOB: [birth year]  03/08/2019  [name] was observed post Covid-19 immunization for 15 minutes without incidence. She was provided with Vaccine Information Sheet and instruction to access the V-Safe system.   [name] was instructed to call 911 with any severe reactions post vaccine: Marland Kitchen Difficulty breathing  . Swelling of your face and throat  . A fast heartbeat  . A bad rash all over your body  . Dizziness and weakness    Immunizations Administered    Name Date Dose VIS Date Route   Pfizer COVID-19 Vaccine 03/08/2019  1:50 PM 0.3 mL 01/05/2019 Intramuscular   Manufacturer: [place]   Lot: [id number]   [place]: [id number]

## 2019-03-27 ENCOUNTER — Ambulatory Visit: Payer: Medicare Other | Admitting: Cardiovascular Disease

## 2019-03-27 ENCOUNTER — Encounter: Payer: Self-pay | Admitting: Cardiovascular Disease

## 2019-03-27 ENCOUNTER — Other Ambulatory Visit: Payer: Self-pay

## 2019-03-27 VITALS — BP 118/62 | HR 76 | Temp 98.3°F | Ht 63.0 in | Wt 167.0 lb

## 2019-03-27 DIAGNOSIS — R002 Palpitations: Secondary | ICD-10-CM | POA: Diagnosis not present

## 2019-03-27 DIAGNOSIS — E7800 Pure hypercholesterolemia, unspecified: Secondary | ICD-10-CM | POA: Diagnosis not present

## 2019-03-27 NOTE — Patient Instructions (Signed)
Medication Instructions:  Your physician recommends that you continue on your current medications as directed. Please refer to the Current Medication list given to you today.  If you need a refill on your cardiac medications before your next appointment, please call your pharmacy.   Lab work: Fasting lipids and hepatic function in 3 months If you have labs (blood work) drawn today and your tests are completely normal, you will receive your results only by: [place] (if you have MyChart) OR A paper copy in the mail If you have any lab test that is abnormal or we need to change your treatment, we will call you to review the results.  Testing/Procedures: NONE  Follow-Up: At [facility], you and your health needs are our priority.  As part of our continuing mission to provide you with exceptional heart care, we have created designated Provider Care Teams.  These Care Teams include your primary Cardiologist (physician) and Advanced Practice Providers (APPs -  Physician Assistants and Nurse Practitioners) who all work together to provide you with the care you need, when you need it. You may see Dr. Gwenlyn Found or one of the following Advanced Practice Providers on your designated Care Team:    Kerin Ransom, PA-C  [place], [place]  Coletta Memos, [place]  Your physician wants you to follow-up in: 1 year with Dr. Gwenlyn Found. You will receive a reminder letter in the mail two months in advance. If you don't receive a letter, please call our office to schedule the follow-up appointment.

## 2019-03-27 NOTE — Progress Notes (Signed)
03/27/2019 Melissa [name]   08-24-1950  [id number]  Primary Physician Aletha Halim., PA-C Primary Cardiologist: Lorretta Harp MD Garret Reddish, [place], [place]  HPI:  Melissa [name] is a 69 y.o.  moderately overweight widowed Caucasian female (husband of 83 years died of cancer in April 21, 2016), mother of 1, grandmother of 2 grandchildren referred by her PCP ([facility]) because of palpitations.  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 I last saw her in the office 01/24/2019. She has seen Dr. Saunders Revel in the past who performed routine GXT/3/19 that was entirely normal for atypical chest pain.  Risk factors include hyperlipidemia intolerant to statin therapy.  She is never had a heart attack or stroke.  There is no family history for heart disease.  She was diagnosed with breast cancer 04/21/2017 and had radiation therapy followed by Dr. Jana Hakim.  She does admit to having panic attacks and anxiety which has been more exacerbated since the loss of her husband.  She saw her PCP on 01/16/2019 which time an EKG showed sinus rhythm with PACs.  Thyroid function tests were normal.  She does not drink alcohol or caffeine.  Since I saw her last she did have an event monitor that showed PVCs with short runs of SVT and a 2D echo that was essentially normal. She does admit to anxiety and stress and has since made a "special friend" and is admittedly happier and asymptomatic. She denies chest pain or shortness of breath.    Current Meds  Medication Sig  . acetaminophen (TYLENOL) 500 MG tablet Take 1,000 mg by mouth every 6 (six) hours as needed for mild pain.  Marland Kitchen ALPRAZolam (XANAX) 0.25 MG tablet 3 (three) times daily. TAKE 1/2 TABLET IN THE MORNING AND 1/2 IN THE EVENING AT BEDTIME  . anastrozole (ARIMIDEX) 1 MG tablet Take 1 tablet (1 mg total) by mouth daily.  . cholecalciferol 2000 units tablet Take 2,000 Units by mouth daily.   . hydrocortisone cream 1 % Apply 1 application topically as  needed for itching.  . hyoscyamine (ANASPAZ) 0.125 MG TBDP disintergrating tablet Take 0.125 mg by mouth every 6 (six) hours as needed.  Marland Kitchen omeprazole (PRILOSEC) 20 MG capsule Take 20 mg by mouth 2 (two) times daily before a meal.     Allergies  Allergen Reactions  . Clindamycin Diarrhea and Nausea Only    Social History   Socioeconomic History  . Marital status: Widowed    Spouse name: Not on file  . Number of children: Not on file  . Years of education: Not on file  . Highest education level: Not on file  Occupational History  . Not on file  Tobacco Use  . Smoking status: Former Smoker    Packs/day: 20.00    Years: 1.00    Pack years: 20.00    Types: Cigarettes    Quit date: 1985    Years since quitting: 36.1  . Smokeless tobacco: Never Used  Substance and Sexual Activity  . Alcohol use: Yes    Comment: social  . Drug use: Never  . Sexual activity: Not Currently  Other Topics Concern  . Not on file  Social History Narrative   09-13-17 Unable to ask abuse questions daughter with her today.   Social Determinants of Health   Financial Resource Strain:   . Difficulty of Paying Living Expenses: Not on file  Food Insecurity:   . Worried About Charity fundraiser in the Last  Year: Not on file  . Ran Out of Food in the Last Year: Not on file  Transportation Needs:   . Lack of Transportation (Medical): Not on file  . Lack of Transportation (Non-Medical): Not on file  Physical Activity:   . Days of Exercise per Week: Not on file  . Minutes of Exercise per Session: Not on file  Stress:   . Feeling of Stress : Not on file  Social Connections:   . Frequency of Communication with Friends and Family: Not on file  . Frequency of Social Gatherings with Friends and Family: Not on file  . Attends Religious Services: Not on file  . Active Member of Clubs or Organizations: Not on file  . Attends Archivist Meetings: Not on file  . Marital Status: Not on file    Intimate Partner Violence:   . Fear of Current or Ex-Partner: Not on file  . Emotionally Abused: Not on file  . Physically Abused: Not on file  . Sexually Abused: Not on file     Review of Systems: General: negative for chills, fever, night sweats or weight changes.  Cardiovascular: negative for chest pain, dyspnea on exertion, edema, orthopnea, palpitations, paroxysmal nocturnal dyspnea or shortness of breath Dermatological: negative for rash Respiratory: negative for cough or wheezing Urologic: negative for hematuria Abdominal: negative for nausea, vomiting, diarrhea, bright red blood per rectum, melena, or hematemesis Neurologic: negative for visual changes, syncope, or dizziness All other systems reviewed and are otherwise negative except as noted above.    Blood pressure 118/62, pulse 76, temperature 98.3 F (36.8 C), height 5\' 3"  (1.6 m), weight 167 lb (75.8 kg).  General appearance: alert and no distress Neck: no adenopathy, no carotid bruit, no JVD, supple, symmetrical, trachea midline and thyroid not enlarged, symmetric, no tenderness/mass/nodules Lungs: clear to auscultation bilaterally Heart: regular rate and rhythm, S1, S2 normal, no murmur, click, rub or gallop Extremities: extremities normal, atraumatic, no cyanosis or edema Pulses: 2+ and symmetric Skin: Skin color, texture, turgor normal. No rashes or lesions  EKG not performed today  ASSESSMENT AND PLAN:   Palpitations History of palpitations when I saw her late last year with event monitor performed 02/01/2019 that showed occasional PVCs and short runs of SVT. She does admit to anxiety and stress which she has markedly reduced since I saw her last and her symptoms have resolved.  Pure hypercholesterolemia History of hyperlipidemia not on statin therapy with lipid profile performed 07/10/2018 revealing total cholesterol 223, LDL 140 and HDL 52. She does admit to dietary indiscretion. I am going to give her heart  healthy diet recheck a lipid liver profile in 3 months. If she is not at goal for primary prevention she may need to be started on a statin drug.      Lorretta Harp MD FACP,FACC,FAHA, [facility] 03/27/2019 9:42 AM

## 2019-03-27 NOTE — Assessment & Plan Note (Signed)
History of hyperlipidemia not on statin therapy with lipid profile performed 07/10/2018 revealing total cholesterol 223, LDL 140 and HDL 52. She does admit to dietary indiscretion. I am going to give her heart healthy diet recheck a lipid liver profile in 3 months. If she is not at goal for primary prevention she may need to be started on a statin drug.

## 2019-03-27 NOTE — Assessment & Plan Note (Signed)
History of palpitations when I saw her late last year with event monitor performed 02/01/2019 that showed occasional PVCs and short runs of SVT. She does admit to anxiety and stress which she has markedly reduced since I saw her last and her symptoms have resolved.

## 2019-05-30 ENCOUNTER — Other Ambulatory Visit: Payer: Self-pay

## 2019-05-30 ENCOUNTER — Emergency Department (HOSPITAL_COMMUNITY): Payer: Medicare Other

## 2019-05-30 ENCOUNTER — Encounter (HOSPITAL_COMMUNITY): Payer: Self-pay | Admitting: Emergency Medicine

## 2019-05-30 ENCOUNTER — Emergency Department (HOSPITAL_COMMUNITY)
Admission: EM | Admit: 2019-05-30 | Discharge: 2019-05-30 | Disposition: A | Discharge status: Home / Self Care | Payer: Medicare Other | Attending: Emergency Medicine | Admitting: Emergency Medicine

## 2019-05-30 DIAGNOSIS — Z87891 Personal history of nicotine dependence: Secondary | ICD-10-CM | POA: Diagnosis not present

## 2019-05-30 DIAGNOSIS — R0989 Other specified symptoms and signs involving the circulatory and respiratory systems: Secondary | ICD-10-CM | POA: Diagnosis not present

## 2019-05-30 DIAGNOSIS — Z853 Personal history of malignant neoplasm of breast: Secondary | ICD-10-CM | POA: Diagnosis not present

## 2019-05-30 NOTE — ED Provider Notes (Addendum)
[facility] EMERGENCY DEPARTMENT Provider Note   CSN: [id number] Arrival date & time: 05/30/19  1514     History Chief Complaint  Patient presents with  . Foreign Body    [place] is a 69 y.o. female presents today for foreign body sensation of the throat.  Patient was flossing her teeth around 7 days ago when she swallowed the plastic threader.  She reports a scratchy sensation to her throat at the time but believes that she was able to swallow the entire thing but it has not yet come out.  Patient is concerned that the foster may be stuck in her throat, she reports she went to the primary care doctor's office today for evaluation.  Patient reports that they spoke with Dr. Wilburn Cornelia with ear nose and throat and that he may see her in the ED as he is "on-call".  She denies fever/chills, headache, swelling of the face/head/neck, drooling, voice change, sore throat, difficulty breathing, cough/hemoptysis, nausea/vomiting, abdominal pain or any additional concerns.  HPI     Past Medical History:  Diagnosis Date  . Anxiety   . Arthritis    shoulders, hands  . Cancer ([place]) 07/2017   right breast cancer  . Chest pain   . Diverticulosis   . GERD (gastroesophageal reflux disease)   . High cholesterol   . IBS (irritable bowel syndrome)   . Kidney disorder    Pt states only has left kidney  . Panic attacks   . Pulmonary embolism ([place]) 2018   pt states resolved and no longer takes blood thinners    Patient Active Problem List   Diagnosis Date Noted  . Aortic atherosclerosis ([place]) 12/04/2018  . Pulmonary embolism ([place]) 07/27/2017  . Osteopenia 07/27/2017  . Malignant neoplasm of upper-outer quadrant of right breast in female, estrogen receptor positive ([place]) 07/22/2017  . Pure hypercholesterolemia 07/14/2017  . Palpitations 04/18/2017  . Shortness of breath 04/18/2017  . Atypical chest pain 03/26/2017  . Leukocytosis 03/26/2017  . Infected  tooth 03/26/2017  . Panic attacks   . GERD (gastroesophageal reflux disease)   . Diverticulosis   . Anxiety   . Kidney disorder     Past Surgical History:  Procedure Laterality Date  . BREAST LUMPECTOMY WITH RADIOACTIVE SEED AND SENTINEL LYMPH NODE BIOPSY Right 08/23/2017   Procedure: BREAST LUMPECTOMY WITH RADIOACTIVE SEED AND SENTINEL LYMPH NODE BIOPSY;  Surgeon: Erroll Luna, MD;  Location: [facility];  Service: General;  Laterality: Right;  . TUBAL LIGATION       OB History    Gravida  2   Para  1   Term      Preterm      AB  1   Living  1     SAB      TAB      Ectopic      Multiple      Live Births              Family History  Problem Relation Age of Onset  . Alzheimer's disease Mother   . Stroke Father 46  . Heart attack Maternal Grandmother   . Heart attack Paternal Grandmother   . Cervical cancer Cousin   . Colon cancer Paternal Aunt   . Colon cancer Cousin     Social History   Tobacco Use  . Smoking status: Former Smoker    Packs/day: 20.00    Years: 1.00  Pack years: 20.00    Types: Cigarettes    Quit date: 83    Years since quitting: 36.3  . Smokeless tobacco: Never Used  Substance Use Topics  . Alcohol use: Yes    Comment: social  . Drug use: Never    Home Medications Prior to Admission medications   Medication Sig Start Date End Date Taking? Authorizing Provider  acetaminophen (TYLENOL) 500 MG tablet Take 1,000 mg by mouth every 6 (six) hours as needed for mild pain.    [provider]  ALPRAZolam Duanne Moron) 0.25 MG tablet 3 (three) times daily. TAKE 1/2 TABLET IN THE MORNING AND 1/2 IN THE EVENING AT BEDTIME    [provider]  anastrozole (ARIMIDEX) 1 MG tablet Take 1 tablet (1 mg total) by mouth daily. 12/04/18   Magrinat, Virgie Dad, MD  cholecalciferol 2000 units tablet Take 2,000 Units by mouth daily.  10/27/17   Magrinat, Virgie Dad, MD  hydrocortisone cream 1 % Apply 1 application  topically as needed for itching.    [provider]  hyoscyamine (ANASPAZ) 0.125 MG TBDP disintergrating tablet Take 0.125 mg by mouth every 6 (six) hours as needed. 01/27/17   [provider]  omeprazole (PRILOSEC) 20 MG capsule Take 20 mg by mouth 2 (two) times daily before a meal.    [provider]    Allergies    Clindamycin  Review of Systems   Review of Systems Ten systems are reviewed and are negative for acute change except as noted in the HPI  Physical Exam Updated Vital Signs BP (!) 151/108 (BP Location: Left Arm)   Pulse 85   Temp 98.9 F (37.2 C) (Oral)   Resp 16   Ht 5\' 3"  (1.6 m)   Wt 73.5 kg   SpO2 99%   BMI 28.70 kg/m   Physical Exam Constitutional:      General: She is not in acute distress.    Appearance: Normal appearance. She is well-developed. She is not ill-appearing or diaphoretic.  HENT:     Head: Normocephalic and atraumatic.     Right Ear: External ear normal.     Left Ear: External ear normal.     Nose: Nose normal.  Eyes:     General: Vision grossly intact. Gaze aligned appropriately.     Pupils: Pupils are equal, round, and reactive to light.  Neck:     Trachea: Trachea and phonation normal. No tracheal tenderness or tracheal deviation.     Comments: The patient has normal phonation and is in control of secretions. No stridor.  Midline uvula without edema. Soft palate rises symmetrically. No tonsillar erythema, swelling or exudates. Tongue protrusion is normal, floor of mouth is soft. No trismus. No creptius on neck palpation. No gingival erythema or fluctuance noted. Mucus membranes moist. No pallor noted. Pulmonary:     Effort: Pulmonary effort is normal. No respiratory distress.  Abdominal:     General: There is no distension.     Palpations: Abdomen is soft.     Tenderness: There is no abdominal tenderness. There is no guarding or rebound.  Musculoskeletal:        General: Normal range of motion.     Cervical  back: Full passive range of motion without pain, normal range of motion and neck supple. No erythema, rigidity or crepitus.  Lymphadenopathy:     Cervical: No cervical adenopathy.  Skin:    General: Skin is warm and dry.  Neurological:  Mental Status: She is alert.     GCS: GCS eye subscore is 4. GCS verbal subscore is 5. GCS motor subscore is 6.     Comments: Speech is clear and goal oriented, follows commands Major Cranial nerves without deficit, no facial droop Moves extremities without ataxia, coordination intact  Psychiatric:        Behavior: Behavior normal.     ED Results / Procedures / Treatments   Labs (all labs ordered are listed, but only abnormal results are displayed) Labs Reviewed - No data to display  EKG None  Radiology DG Neck Soft Tissue  Result Date: 05/30/2019 CLINICAL DATA:  Foreign body sensation. EXAM: NECK SOFT TISSUES - 1+ VIEW COMPARISON:  None. FINDINGS: There is no evidence of retropharyngeal soft tissue swelling or epiglottic enlargement. The cervical airway is unremarkable and no radio-opaque foreign body identified. IMPRESSION: Negative. Electronically Signed   By: Marijo Conception M.D.   On: 05/30/2019 17:15    Procedures Procedures (including critical care time)  Medications Ordered in ED Medications - No data to display  ED Course  I have reviewed the triage vital signs and the nursing notes.  Pertinent labs & imaging results that were available during my care of the patient were reviewed by me and considered in my medical decision making (see chart for details).  Clinical Course as of May 30 1810  Wed May 30, 2019  1641 Dr. Wilburn Cornelia; if x-rays reassuring follow-up tomorrow in office.   [BM]    Clinical Course User Index [BM] Gari Crown   MDM Rules/Calculators/A&P                     I reviewed patient's chart, she had a family medicine office visit today for inhaled foreign object.  It appears they gave her an  ambulatory referral to ENT.  There does not appear to be any documentation of a phone call to Dr. Wilburn Cornelia. - 4:41 PM: I called consult to ENT and spoke to Dr. Wilburn Cornelia, he did not speak to PCP earlier and was unaware of this patient.  I suspect that this was a misunderstanding for the patient today.  Dr. Wilburn Cornelia advised that if x-ray is reassuring today she may follow-up in his office tomorrow. - DG soft tissue neck IMPRESSION:  Negative.  I personally reviewed patient's x-ray and agree with radiologist interpretation.  Patient's airway is patent, there is no evidence of PTA, RPA, Ludwick's, tonsillitis, pharyngitis, airway obstruction or other emergent pathologies at this time.  There is no indication for CT imaging of the neck.  Patient tolerating p.o. without difficulty.  Suspect patient may have mild inflammation causing foreign body sensation today.  I did order DG chest with abdomen for evaluation of foreign body however patient refused the study citing concern of radiation.  Shared decision making was made and it appears reasonable for patient to defer chest and abdominal x-ray at this time, doubt aspiration at this time, patient appears stable for outpatient ENT and PCP follow-up.  At this time there does not appear to be any evidence of an acute emergency medical condition and the patient appears stable for discharge with appropriate outpatient follow up. Diagnosis was discussed with patient who verbalizes understanding of care plan and is agreeable to discharge. I have discussed return precautions with patient  who verbalizes understanding. Patient encouraged to follow-up with their PCP and ENT. All questions answered.  Patient's case discussed with Dr. Stark Jock who agrees with  plan to discharge with follow-up.   Note: Portions of this report may have been transcribed using voice recognition software. Every effort was made to ensure accuracy; however, inadvertent computerized transcription  errors may still be present. Final Clinical Impression(s) / ED Diagnoses Final diagnoses:  Foreign body sensation in throat    Rx / DC Orders ED Discharge Orders    None       Deliah Boston, PA-C 05/30/19 1811    Deliah Boston, PA-C 05/30/19 1812    Veryl Speak, MD 05/31/19 [phone]

## 2019-05-30 NOTE — ED Notes (Signed)
Patient verbalizes understanding of discharge instructions. Opportunity for questioning and answers were provided. Armband removed by staff, pt discharged from ED and ambulated to lobby to go home with family.   

## 2019-05-30 NOTE — ED Triage Notes (Signed)
Patient states about a week ago while flossing she swallowed the threader. Patient unsure if she was able to fully pass it or if throat is sore. Able to maintain saliva and eat over the past week. Feels like a tickle in her throat.

## 2019-05-30 NOTE — Discharge Instructions (Addendum)
You have been diagnosed today with Foreign Body Sensation in the Throat.  At this time there does not appear to be the presence of an emergent medical condition, however there is always the potential for conditions to change. Please read and follow the below instructions.  Please return to the Emergency Department immediately for any new or worsening symptoms. Please be sure to follow up with your Primary Care Provider within one week regarding your visit today; please call their office to schedule an appointment even if you are feeling better for a follow-up visit. Call Dr. Wilburn Cornelia.  He would like to follow-up with you in his office tomorrow for reevaluation.  If you have any new or worsening symptoms do not wait for your follow-up appointment with Dr. Wilburn Cornelia, instead return immediately to the emergency department.  Get help right away if: You have a fever. You have pain in your chest or your belly. You cough up blood. You have blood in your poop. You have blood in your vomit after treatment. You have voice change, drooling or difficulty swallowing You have any new/concerning or worsening of symptoms  Please read the additional information packets attached to your discharge summary.  Do not take your medicine if  develop an itchy rash, swelling in your mouth or lips, or difficulty breathing; call 911 and seek immediate emergency medical attention if this occurs.  Note: Portions of this text may have been transcribed using voice recognition software. Every effort was made to ensure accuracy; however, inadvertent computerized transcription errors may still be present.

## 2019-06-12 ENCOUNTER — Telehealth: Payer: Self-pay | Admitting: *Deleted

## 2019-06-12 NOTE — Telephone Encounter (Signed)
This RN spoke with pt per call stating concern due to new onset of right breast and axillary pain.  She states above occurred over the past few days after an episode of " I think I swallowed my threader floss "  She states approximately 10 days ago she was using a threader to floss her teeth - she thinks she swallowed it - and after a day she continued to feel a " lump " in throat- she went to her primary MD who sent her to an ENT who stated to meet at the ER.  She had an xray which did not show anything as well as the following day she went to the ENT MD office and they did a laryngeal scope with negative findings.  She states after that is when her breast started hurting.  She denies any noted swelling but does state some tenderness to touch ( not new ).  Per phone discussion plan is for the patient to come in for evaluation of area for possible cellulitis.

## 2019-06-13 ENCOUNTER — Encounter: Payer: Self-pay | Admitting: Adult Health

## 2019-06-13 ENCOUNTER — Telehealth: Payer: Self-pay | Admitting: *Deleted

## 2019-06-13 ENCOUNTER — Inpatient Hospital Stay: Payer: Medicare Other | Attending: Adult Health | Admitting: Adult Health

## 2019-06-13 ENCOUNTER — Other Ambulatory Visit: Payer: Self-pay

## 2019-06-13 ENCOUNTER — Ambulatory Visit (HOSPITAL_COMMUNITY)
Admission: RE | Admit: 2019-06-13 | Discharge: 2019-06-13 | Disposition: A | Discharge status: Home / Self Care | Payer: Medicare Other | Source: Ambulatory Visit | Attending: Adult Health | Admitting: Adult Health

## 2019-06-13 VITALS — BP 142/71 | HR 72 | Temp 98.9°F | Resp 18 | Ht 63.0 in | Wt 164.7 lb

## 2019-06-13 DIAGNOSIS — Z87891 Personal history of nicotine dependence: Secondary | ICD-10-CM | POA: Insufficient documentation

## 2019-06-13 DIAGNOSIS — E7800 Pure hypercholesterolemia, unspecified: Secondary | ICD-10-CM | POA: Insufficient documentation

## 2019-06-13 DIAGNOSIS — Z170 Estrogen receptor positive status [ER+]: Secondary | ICD-10-CM

## 2019-06-13 DIAGNOSIS — M25511 Pain in right shoulder: Secondary | ICD-10-CM | POA: Insufficient documentation

## 2019-06-13 DIAGNOSIS — Z881 Allergy status to other antibiotic agents status: Secondary | ICD-10-CM | POA: Diagnosis not present

## 2019-06-13 DIAGNOSIS — Z86711 Personal history of pulmonary embolism: Secondary | ICD-10-CM | POA: Diagnosis not present

## 2019-06-13 DIAGNOSIS — Z800 Family history of malignant neoplasm of digestive organs: Secondary | ICD-10-CM | POA: Insufficient documentation

## 2019-06-13 DIAGNOSIS — M542 Cervicalgia: Secondary | ICD-10-CM

## 2019-06-13 DIAGNOSIS — Z923 Personal history of irradiation: Secondary | ICD-10-CM | POA: Insufficient documentation

## 2019-06-13 DIAGNOSIS — Z8049 Family history of malignant neoplasm of other genital organs: Secondary | ICD-10-CM | POA: Diagnosis not present

## 2019-06-13 DIAGNOSIS — M1990 Unspecified osteoarthritis, unspecified site: Secondary | ICD-10-CM | POA: Diagnosis not present

## 2019-06-13 DIAGNOSIS — C50411 Malignant neoplasm of upper-outer quadrant of right female breast: Secondary | ICD-10-CM | POA: Diagnosis not present

## 2019-06-13 DIAGNOSIS — Z823 Family history of stroke: Secondary | ICD-10-CM | POA: Insufficient documentation

## 2019-06-13 DIAGNOSIS — Z8249 Family history of ischemic heart disease and other diseases of the circulatory system: Secondary | ICD-10-CM | POA: Diagnosis not present

## 2019-06-13 DIAGNOSIS — Z79899 Other long term (current) drug therapy: Secondary | ICD-10-CM | POA: Diagnosis not present

## 2019-06-13 DIAGNOSIS — Z818 Family history of other mental and behavioral disorders: Secondary | ICD-10-CM | POA: Diagnosis not present

## 2019-06-13 DIAGNOSIS — Z79811 Long term (current) use of aromatase inhibitors: Secondary | ICD-10-CM | POA: Insufficient documentation

## 2019-06-13 NOTE — Progress Notes (Signed)
Melissa [name]  [phone] [phone]     ID: Melissa [name] DOB: [birth year]  MR#: [id number]  [facility]#:[id number]  Patient Care Team: Aletha Halim., PA-C as PCP - General (Family Medicine) End, Harrell Gave, MD as PCP - Cardiology (Cardiology) Erroll Luna, MD as Consulting Physician (General Surgery) Magrinat, Virgie Dad, MD as Consulting Physician (Oncology) Kyung Rudd, MD as Consulting Physician (Radiation Oncology) Christain Sacramento, MD as Consulting Physician (Family Medicine) Aletha Halim., PA-C as Physician Assistant (Family Medicine) Richmond Campbell, MD as Consulting Physician (Gastroenterology) Delice Bison, Charlestine Massed, NP as Nurse Practitioner (Hematology and Oncology) OTHER MD:    CHIEF COMPLAINT: Estrogen receptor positive breast cancer  CURRENT TREATMENT: Anastrozole   INTERVAL HISTORY: Melissa [name] returns today for follow up of her estrogen receptor positive breast cancer  She continues on anastrozole.  She is here for urgent evaluation of right breast and shoulder pain that has been slowly worsening over the past couple of weeks.  The area is more swollen and tender to touch.  She denies any recent heavy lifting, erythema, fever chills, or any other concerns today.     REVIEW OF SYSTEMS: A detailed ROS Was otherwise non contributory.     HISTORY OF CURRENT ILLNESS: From the original intake note:  Melissa [name] had routine screening bilateral mammography with tomography at [facility] on 07/12/2017 showing; breast density category C. There was a possible mass in the right breast. On 07/14/2017 she completed unilateral right ultrasonography at [facility] that showed an irregular hypoechoic 1.6 cm mass in the right upper outer quadrant and posterior depth. Evaluation of the right axilla showed 4 lymph nodes with cortical thickening suspicious of malignancy.   Accordingly on 07/19/2017 she proceeded to biopsy of the right breast area and 1 lymph  node in question. 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  The lymph node was biopsied showing no evidence of carcinoma, which was found to be concordant.  Prognostic indicators significant for: estrogen receptor, 95% positive and progesterone receptor, 95% positive, both with strong staining intensity. Proliferation marker Ki67 at 10% . HER2 not amplified with ratios HER2/CEP17 signals 1.22 and average HER2 copies per cell 1.95  The patient's subsequent history is as detailed below.   PAST MEDICAL HISTORY: Past Medical History:  Diagnosis Date  . Anxiety   . Arthritis    shoulders, hands  . Cancer ([place]) 07/2017   right breast cancer  . Chest pain   . Diverticulosis   . GERD (gastroesophageal reflux disease)   . High cholesterol   . IBS (irritable bowel syndrome)   . Kidney disorder    Pt states only has left kidney  . Panic attacks   . Pulmonary embolism ([place]) 2018   pt states resolved and no longer takes blood thinners  Small pulmonary emboli - eliquis for 6 months- resolved.  Depressive and anxiety moods - xanax One functioning Kidney   PAST SURGICAL HISTORY: Past Surgical History:  Procedure Laterality Date  . BREAST LUMPECTOMY WITH RADIOACTIVE SEED AND SENTINEL LYMPH NODE BIOPSY Right 08/23/2017   Procedure: BREAST LUMPECTOMY WITH RADIOACTIVE SEED AND SENTINEL LYMPH NODE BIOPSY;  Surgeon: Erroll Luna, MD;  Location: [facility];  Service: General;  Laterality: Right;  . TUBAL LIGATION      FAMILY HISTORY Family History  Problem Relation Age of Onset  . Alzheimer's disease Mother   . Stroke Father 87  . Heart attack Maternal  Grandmother   . Heart attack Paternal Grandmother   . Cervical cancer Cousin   . Colon cancer Paternal Aunt   . Colon cancer Cousin    The patient's father died at age 60 due to CHF. The patient's mother died at age 47 due to  Alzheimer's. The patient has 1 brother and 4 sisters. There was a paternal 1st cousin diagnosed with cervical cancer at age 57. There was a paternal aunt diagnosed with colon cancer at age 22. There was a paternal 1st cousin diagnosed with colon cancer at age 104. The patient denies a family history of breast or ovarian cancer.     GYNECOLOGIC HISTORY:  No LMP recorded. Patient is postmenopausal. Menarche: 69 years old Age at first live birth: 69 years old She is GX P1. Her LMP was in 2008. She used oral contraception with no complications. She did not used HRT.     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 Her husband, Melissa [name], died in Nov 22, 2016 due to pancreatic cancer. He was a Furniture conservator/restorer. She lives alone with her 48 year old dog, a Web designer. The patient's daughter, Melissa [name] lives in [place] and works at [employer]. Melissa [name] has two blood grandchildren and two step-grandchildren.   ADVANCED DIRECTIVES: The patient plans to name her daughter, Melissa [name] as her HCPOA. The appropriate forms were given at the 07/27/2017 visit.    HEALTH MAINTENANCE: Social History   Tobacco Use  . Smoking status: Former Smoker    Packs/day: 20.00    Years: 1.00    Pack years: 20.00    Types: Cigarettes    Quit date: 1985    Years since quitting: 36.4  . Smokeless tobacco: Never Used  Substance Use Topics  . Alcohol use: Yes    Comment: social  . Drug use: Never    Colonoscopy: Feb. 2019/ [facility]/ diverticulosis  PAP: Nov. 2018  Bone density: Nov. 2018 / osteopenia   Allergies  Allergen Reactions  . Clindamycin Diarrhea and Nausea Only    Current Outpatient Medications  Medication Sig Dispense Refill  . acetaminophen (TYLENOL) 500 MG tablet Take 1,000 mg by mouth every 6 (six) hours as needed for mild pain.    Marland Kitchen ALPRAZolam (XANAX) 0.25 MG tablet 3 (three) times daily. TAKE 1/2 TABLET IN THE MORNING AND 1/2 IN THE EVENING AT BEDTIME      . anastrozole (ARIMIDEX) 1 MG tablet Take 1 tablet (1 mg total) by mouth daily. 90 tablet 12  . cholecalciferol 2000 units tablet Take 2,000 Units by mouth daily.     . hydrocortisone cream 1 % Apply 1 application topically as needed for itching.    . hyoscyamine (ANASPAZ) 0.125 MG TBDP disintergrating tablet Take 0.125 mg by mouth every 6 (six) hours as needed.  3  . omeprazole (PRILOSEC) 20 MG capsule Take 20 mg by mouth 2 (two) times daily before a meal.     No current facility-administered medications for this visit.    OBJECTIVE: Middle-aged white woman who appears stated age  There were no vitals filed for this visit.   There is no height or weight on file to calculate BMI.   Wt Readings from Last 3 Encounters:  05/30/19 162 lb (73.5 kg)  03/27/19 167 lb (75.8 kg)  01/24/19 174 lb (78.9 kg)      ECOG FS:1 - Symptomatic but completely ambulatory  GENERAL: Patient is a well appearing female in no acute distress HEENT:  Sclerae  anicteric.  Mask in place. Neck is supple.  NODES:  No cervical, supraclavicular, or axillary lymphadenopathy palpated.  BREAST EXAM:  Right breast is slightly swollen, tenderness and ? Fluctuance in right axilla at surgical site, no erythema noted,or warmth.  Shoulder without tenderness, ROM is intact in right shoulder.  LUNGS:  Clear to auscultation bilaterally.  No wheezes or rhonchi. HEART:  Regular rate and rhythm. No murmur appreciated. ABDOMEN:  Soft, nontender.  Positive, normoactive bowel sounds. No organomegaly palpated. MSK:  No focal spinal tenderness to palpation. Full range of motion bilaterally in the upper extremities. EXTREMITIES:  No peripheral edema.   SKIN:  Clear with no obvious rashes or skin changes. No nail dyscrasia. NEURO:  Nonfocal. Well oriented.  Appropriate affect.     LAB RESULTS:  CMP     Component Value Date/Time   NA 141 12/04/2018 1337   K 3.7 12/04/2018 1337   CL 104 12/04/2018 1337   CO2 27 12/04/2018 1337    GLUCOSE 95 12/04/2018 1337   BUN 18 12/04/2018 1337   CREATININE 0.85 12/04/2018 1337   CREATININE 0.85 08/02/2018 0904   CALCIUM 9.6 12/04/2018 1337   PROT 7.5 12/04/2018 1337   ALBUMIN 4.1 12/04/2018 1337   AST 20 12/04/2018 1337   AST 23 08/02/2018 0904   ALT 17 12/04/2018 1337   ALT 18 08/02/2018 0904   ALKPHOS 70 12/04/2018 1337   BILITOT 0.4 12/04/2018 1337   BILITOT 0.5 08/02/2018 0904   GFRNONAA >60 12/04/2018 1337   GFRNONAA >60 08/02/2018 0904   GFRAA >60 12/04/2018 1337   GFRAA >60 08/02/2018 0904    No results found for: TOTALPROTELP, ALBUMINELP, A1GS, A2GS, BETS, BETA2SER, GAMS, MSPIKE, SPEI  No results found for: KPAFRELGTCHN, LAMBDASER, KAPLAMBRATIO  Lab Results  Component Value Date   WBC 7.5 12/04/2018   NEUTROABS 4.1 12/04/2018   HGB 13.3 12/04/2018   HCT 40.2 12/04/2018   MCV 95.0 12/04/2018   PLT 259 12/04/2018    _0 @  No results found for: LABCA2  No components found for: JGOTLX726  No results for input(s): INR in the last 168 hours.  No results found for: LABCA2  No results found for: OMB559  No results found for: RCB638  No results found for: GTX646  No results found for: CA2729  No components found for: HGQUANT  No results found for: CEA1 / No results found for: CEA1   No results found for: AFPTUMOR  No results found for: CHROMOGRNA  No results found for: PSA1  No visits with results within 3 Day(s) from this visit.  Latest known visit with results is:  Appointment on 12/04/2018  Component Date Value Ref Range Status  . Sodium 12/04/2018 141  135 - 145 mmol/L Final  . Potassium 12/04/2018 3.7  3.5 - 5.1 mmol/L Final  . Chloride 12/04/2018 104  98 - 111 mmol/L Final  . CO2 12/04/2018 27  22 - 32 mmol/L Final  . Glucose, Bld 12/04/2018 95  70 - 99 mg/dL Final  . BUN 12/04/2018 18  8 - 23 mg/dL Final  . Creatinine, Ser 12/04/2018 0.85  0.44 - 1.00 mg/dL Final  . Calcium 12/04/2018 9.6  8.9 - 10.3 mg/dL Final  .  Total Protein 12/04/2018 7.5  6.5 - 8.1 g/dL Final  . Albumin 12/04/2018 4.1  3.5 - 5.0 g/dL Final  . AST 12/04/2018 20  15 - 41 U/L Final  . ALT 12/04/2018 17  0 - 44 U/L Final  . Alkaline  Phosphatase 12/04/2018 70  38 - 126 U/L Final  . Total Bilirubin 12/04/2018 0.4  0.3 - 1.2 mg/dL Final  . GFR calc non Af Amer 12/04/2018 >60  >60 mL/min Final  . GFR calc Af Amer 12/04/2018 >60  >60 mL/min Final  . Anion gap 12/04/2018 10  5 - 15 Final   Performed at [facility], [place] [address], [place], [place] [address]  . WBC 12/04/2018 7.5  4.0 - 10.5 K/uL Final  . RBC 12/04/2018 4.23  3.87 - 5.11 MIL/uL Final  . Hemoglobin 12/04/2018 13.3  12.0 - 15.0 g/dL Final  . HCT 12/04/2018 40.2  36.0 - 46.0 % Final  . MCV 12/04/2018 95.0  80.0 - 100.0 fL Final  . MCH 12/04/2018 31.4  26.0 - 34.0 pg Final  . MCHC 12/04/2018 33.1  30.0 - 36.0 g/dL Final  . RDW 12/04/2018 12.6  11.5 - 15.5 % Final  . Platelets 12/04/2018 259  150 - 400 K/uL Final  . nRBC 12/04/2018 0.0  0.0 - 0.2 % Final  . Neutrophils Relative % 12/04/2018 55  % Final  . Neutro Abs 12/04/2018 4.1  1.7 - 7.7 K/uL Final  . Lymphocytes Relative 12/04/2018 33  % Final  . Lymphs Abs 12/04/2018 2.5  0.7 - 4.0 K/uL Final  . Monocytes Relative 12/04/2018 9  % Final  . Monocytes Absolute 12/04/2018 0.7  0.1 - 1.0 K/uL Final  . Eosinophils Relative 12/04/2018 2  % Final  . Eosinophils Absolute 12/04/2018 0.2  0.0 - 0.5 K/uL Final  . Basophils Relative 12/04/2018 1  % Final  . Basophils Absolute 12/04/2018 0.1  0.0 - 0.1 K/uL Final  . Immature Granulocytes 12/04/2018 0  % Final  . Abs Immature Granulocytes 12/04/2018 0.03  0.00 - 0.07 K/uL Final   Performed at [facility], [place] [address] [place], [place] [address]    (this displays the last labs from the last 3 days)  No results found for: TOTALPROTELP, ALBUMINELP, A1GS, A2GS, BETS, BETA2SER, GAMS, MSPIKE, SPEI (this displays SPEP  labs)  No results found for: KPAFRELGTCHN, LAMBDASER, KAPLAMBRATIO (kappa/lambda light chains)  No results found for: HGBA, HGBA2QUANT, HGBFQUANT, HGBSQUAN (Hemoglobinopathy evaluation)   No results found for: LDH  No results found for: IRON, TIBC, IRONPCTSAT (Iron and TIBC)  No results found for: FERRITIN  Urinalysis    Component Value Date/Time   COLORURINE STRAW (A) 11/10/2018 [place] 11/10/2018 1242   LABSPEC <1.005 (L) 11/10/2018 1242   PHURINE 7.0 11/10/2018 1242   [place] 11/10/2018 1242   HGBUR TRACE (A) 11/10/2018 1242   BILIRUBINUR NEGATIVE 11/10/2018 1242   [place] 11/10/2018 1242   PROTEINUR NEGATIVE 11/10/2018 1242   NITRITE NEGATIVE 11/10/2018 1242   LEUKOCYTESUR NEGATIVE 11/10/2018 1242    STUDIES: DG Neck Soft Tissue  Result Date: 05/30/2019 CLINICAL DATA:  Foreign body sensation. EXAM: NECK SOFT TISSUES - 1+ VIEW COMPARISON:  None. FINDINGS: There is no evidence of retropharyngeal soft tissue swelling or epiglottic enlargement. The cervical airway is unremarkable and no radio-opaque foreign body identified. IMPRESSION: Negative. Electronically Signed   By: Marijo Conception M.D.   On: 05/30/2019 17:15     ELIGIBLE FOR AVAILABLE RESEARCH PROTOCOL: no   ASSESSMENT: 69 y.o. [place], [place] woman status post right breast upper outer quadrant biopsy 07/19/2017 for a clinical T1c NX, stage IA invasive ductal carcinoma, grade 2, estrogen and progesterone receptor positive, HER-2 not amplified, with  an MIB-1 of 10%  (a) 1 of 4 suspicious axillary lymph nodes biopsied 07/19/2017 was negative  (1) status post right lumpectomy and sentinel lymph node sampling 08/23/2017 for a pT1c pN0, stage IA invasive ductal carcinoma, grade 2, with negative margins  (a) total of 5 sentinel lymph nodes removed  (2) The Oncotype DX score was 12, predicting a risk of outside the breast recurrence over the next 9 years of 3% if the patient's only  systemic therapy is tamoxifen for 5 years.  It also predicts no significant benefit from chemotherapy.  (3) adjuvant radiation completed 09/27/2017 - 10/24/2017  Site/dose: The patient initially received a dose of 42.56 Gy in 16 fractions to the breast using whole-breast tangent fields. This was delivered using a 3-D conformal technique. The patient then received a boost to the seroma. This delivered an additional 8 Gy in 4 fractions using a 3 field photon technique due to the depth of the seroma. The total dose was 50.56 Gy.  (4) anastrozole started 11/25/2017  (a) density 05/10/2016 showed a T score of -1.9  (b) repeat DEXA scan 06/29/2018 showed a T score of -2.3  (c) alendronate/Fosamax started 08/02/2018  (5) history of acute right lower lobe pulmonary embolus 03/19/2016; bilateral Dopplers lower extremity same day were clear  (a) status post apixaban x6 months   PLAN: Shaletta is here today for urgent evaluation of this new pain in her right shoulder, axilla, and neck.  To start we will do the following things.  We will get plain films of the spine and shoulder, along with mammogram and ultrasound of the right breast in particular.  Her mammogram is already due in June, so we will see if they can move it to earlier.    Depending on those results, will depend on next steps.  We did discuss potentially referring her to physical therapy.    I will call her with the above results.  She is scheduled to f/u with Dr. Jana Hakim in November.  She knows to call for any other issue that may develop before then.   Total encounter time: 20 minutes  Wilber Bihari, NP 06/13/19 6:39 AM Medical Oncology and Hematology [facility] [place], [place] [address] Tel. [phone]    Fax. [phone]  *Total Encounter Time as defined by the Centers for Medicare and Medicaid Services includes, in addition to the face-to-face time of a patient visit (documented in the note above)  non-face-to-face time: obtaining and reviewing outside history, ordering and reviewing medications, tests or procedures, care coordination (communications with other health care professionals or caregivers) and documentation in the medical record.

## 2019-06-13 NOTE — Telephone Encounter (Signed)
Per Annabelle Harman, called to make pt aware of appt with [facility] for mammogram and breast US on 6/9 @ 2:15pm. Pt was given [facility] number in case rescheduling is needed. Pt verbalized understanding

## 2019-06-13 NOTE — Telephone Encounter (Signed)
Thank you Val.  I want her to get this completed ASAP.  I agree that 6/9 is too far out.

## 2019-06-13 NOTE — Telephone Encounter (Signed)
This RN received VM from the patient stating she was called by this office post her visit with date for mammo and [place] for 07/04/2019- pt is concerned due to visit today and discussion of possible localized infection.  She tried to call [facility] to reschedule and was not able to speak to anyone per above.  This RN called [facility] locally and obtained VM for Melissa [name] - detailed message left stating urgent need for appointment due to noted issues and need for evaluation ASAP.  This RN's direct number given for contact.  This RN then called pt and informed her of above and that return call is expected tomorrow for appointment ASAP.  This RN also advised the patient to call to this office if she has not heard about appointment by mid day for possible need to call again.  Melissa [name] verbalized understanding and appreciation of call.

## 2019-06-14 ENCOUNTER — Telehealth: Payer: Self-pay | Admitting: Adult Health

## 2019-06-14 ENCOUNTER — Telehealth: Payer: Self-pay | Admitting: *Deleted

## 2019-06-14 NOTE — Telephone Encounter (Signed)
No 5/19 los. No changes made to pt's schedule.

## 2019-06-14 NOTE — Telephone Encounter (Signed)
Received voice mail from [place] at [facility] stating they were able to move Rene's appt to 5/25 @ 0900

## 2019-06-15 NOTE — Telephone Encounter (Signed)
FYI- as I tell my grand kids- we never stop learning ! Thanks Val

## 2019-12-04 ENCOUNTER — Ambulatory Visit: Payer: Medicare Other | Admitting: Oncology

## 2019-12-04 ENCOUNTER — Other Ambulatory Visit: Payer: Medicare Other

## 2019-12-06 ENCOUNTER — Other Ambulatory Visit: Payer: Self-pay

## 2019-12-06 ENCOUNTER — Inpatient Hospital Stay: Payer: Medicare Other | Attending: Oncology

## 2019-12-06 ENCOUNTER — Inpatient Hospital Stay: Payer: Medicare Other | Admitting: Oncology

## 2019-12-06 VITALS — BP 148/71 | HR 80 | Temp 97.7°F | Resp 18 | Ht 63.0 in | Wt 165.0 lb

## 2019-12-06 DIAGNOSIS — Z86711 Personal history of pulmonary embolism: Secondary | ICD-10-CM | POA: Diagnosis not present

## 2019-12-06 DIAGNOSIS — Z8249 Family history of ischemic heart disease and other diseases of the circulatory system: Secondary | ICD-10-CM | POA: Diagnosis not present

## 2019-12-06 DIAGNOSIS — Z79899 Other long term (current) drug therapy: Secondary | ICD-10-CM | POA: Insufficient documentation

## 2019-12-06 DIAGNOSIS — Z8049 Family history of malignant neoplasm of other genital organs: Secondary | ICD-10-CM | POA: Insufficient documentation

## 2019-12-06 DIAGNOSIS — Z818 Family history of other mental and behavioral disorders: Secondary | ICD-10-CM | POA: Insufficient documentation

## 2019-12-06 DIAGNOSIS — M1990 Unspecified osteoarthritis, unspecified site: Secondary | ICD-10-CM | POA: Diagnosis not present

## 2019-12-06 DIAGNOSIS — Z923 Personal history of irradiation: Secondary | ICD-10-CM | POA: Diagnosis not present

## 2019-12-06 DIAGNOSIS — Z87891 Personal history of nicotine dependence: Secondary | ICD-10-CM | POA: Diagnosis not present

## 2019-12-06 DIAGNOSIS — M25511 Pain in right shoulder: Secondary | ICD-10-CM | POA: Diagnosis not present

## 2019-12-06 DIAGNOSIS — Z79811 Long term (current) use of aromatase inhibitors: Secondary | ICD-10-CM | POA: Diagnosis not present

## 2019-12-06 DIAGNOSIS — Z823 Family history of stroke: Secondary | ICD-10-CM | POA: Diagnosis not present

## 2019-12-06 DIAGNOSIS — Z170 Estrogen receptor positive status [ER+]: Secondary | ICD-10-CM | POA: Diagnosis not present

## 2019-12-06 DIAGNOSIS — C50411 Malignant neoplasm of upper-outer quadrant of right female breast: Secondary | ICD-10-CM | POA: Insufficient documentation

## 2019-12-06 DIAGNOSIS — I2782 Chronic pulmonary embolism: Secondary | ICD-10-CM

## 2019-12-06 DIAGNOSIS — R0602 Shortness of breath: Secondary | ICD-10-CM | POA: Insufficient documentation

## 2019-12-06 DIAGNOSIS — E7800 Pure hypercholesterolemia, unspecified: Secondary | ICD-10-CM | POA: Diagnosis not present

## 2019-12-06 DIAGNOSIS — Z800 Family history of malignant neoplasm of digestive organs: Secondary | ICD-10-CM | POA: Diagnosis not present

## 2019-12-06 DIAGNOSIS — Z881 Allergy status to other antibiotic agents status: Secondary | ICD-10-CM | POA: Insufficient documentation

## 2019-12-06 DIAGNOSIS — I2690 Septic pulmonary embolism without acute cor pulmonale: Secondary | ICD-10-CM

## 2019-12-06 DIAGNOSIS — R0789 Other chest pain: Secondary | ICD-10-CM

## 2019-12-06 DIAGNOSIS — R232 Flushing: Secondary | ICD-10-CM | POA: Insufficient documentation

## 2019-12-06 DIAGNOSIS — R059 Cough, unspecified: Secondary | ICD-10-CM | POA: Diagnosis not present

## 2019-12-06 LAB — COMPREHENSIVE METABOLIC PANEL
ALT: 23 U/L (ref 0–44)
AST: 26 U/L (ref 15–41)
Albumin: 3.9 g/dL (ref 3.5–5.0)
Alkaline Phosphatase: 75 U/L (ref 38–126)
Anion gap: 7 (ref 5–15)
BUN: 19 mg/dL (ref 8–23)
CO2: 28 mmol/L (ref 22–32)
Calcium: 9.5 mg/dL (ref 8.9–10.3)
Chloride: 104 mmol/L (ref 98–111)
Creatinine, Ser: 0.79 mg/dL (ref 0.44–1.00)
GFR, Estimated: >60 mL/min (ref >60)
Glucose, Bld: 98 mg/dL (ref 70–99)
Potassium: 3.9 mmol/L (ref 3.5–5.1)
Sodium: 139 mmol/L (ref 135–145)
Total Bilirubin: 0.4 mg/dL (ref 0.3–1.2)
Total Protein: 7.4 g/dL (ref 6.5–8.1)

## 2019-12-06 LAB — CBC WITH DIFFERENTIAL/PLATELET
Abs Immature Granulocytes: 0.02 10*3/uL (ref 0.00–0.07)
Basophils Absolute: 0.1 10*3/uL (ref 0.0–0.1)
Basophils Relative: 1 %
Eosinophils Absolute: 0.1 10*3/uL (ref 0.0–0.5)
Eosinophils Relative: 2 %
HCT: 39.7 % (ref 36.0–46.0)
Hemoglobin: 13.2 g/dL (ref 12.0–15.0)
Immature Granulocytes: 0 %
Lymphocytes Relative: 31 %
Lymphs Abs: 2.5 10*3/uL (ref 0.7–4.0)
MCH: 31.3 pg (ref 26.0–34.0)
MCHC: 33.2 g/dL (ref 30.0–36.0)
MCV: 94.1 fL (ref 80.0–100.0)
Monocytes Absolute: 0.8 10*3/uL (ref 0.1–1.0)
Monocytes Relative: 9 %
Neutro Abs: 4.6 10*3/uL (ref 1.7–7.7)
Neutrophils Relative %: 57 %
Platelets: 267 10*3/uL (ref 150–400)
RBC: 4.22 MIL/uL (ref 3.87–5.11)
RDW: 13.1 % (ref 11.5–15.5)
WBC: 8.1 10*3/uL (ref 4.0–10.5)
nRBC: 0 % (ref 0.0–0.2)

## 2019-12-06 NOTE — Progress Notes (Signed)
[place]  [phone] [phone]     ID: Melissa [name] DOB: April 05, 1950  MR#: [id number]  JGG#:[id number]  Patient Care Team: Aletha Halim., PA-C as PCP - General (Family Medicine) End, Harrell Gave, MD as PCP - Cardiology (Cardiology) Erroll Luna, MD as Consulting Physician (General Surgery) Fujie Dickison, Virgie Dad, MD as Consulting Physician (Oncology) Kyung Rudd, MD as Consulting Physician (Radiation Oncology) Christain Sacramento, MD as Consulting Physician (Family Medicine) Aletha Halim., PA-C as Physician Assistant (Family Medicine) Richmond Campbell, MD as Consulting Physician (Gastroenterology) Delice Bison, Charlestine Massed, NP as Nurse Practitioner (Hematology and Oncology) OTHER MD:    CHIEF COMPLAINT: Estrogen receptor positive breast cancer  CURRENT TREATMENT: Anastrozole   INTERVAL HISTORY: [name] returns today for follow up of her estrogen receptor positive breast cancer.  She is accompanied by her significant other [name] is a Chiropodist  She continues on anastrozole.   She has occasional hot flashes, but more of a vaginal dryness issue.  She participated in our pelvic rehab program but did not find it useful.  She is using some lubricants.  On 06/14/2019 she underwent bilateral diagnostic mammography at [facility].  This showed some postsurgical distortion and lumpectomy changes in the posterior upper outer right breast which are unchanged.  The patient complained of focal right breast pain but there was no mammographic correlate specifically no suspicious masses or calcifications and basically no interval change they proceeded to right ultrasonography which showed no correlate to the patient's pain in the upper outer right breast.  On 09/10/2019 she was seen at [facility] for shortness of breath and a cough.  Chest x-ray suggested possibly resolving bronchitis and raised a question regarding interstitial lung  disease.  She had many questions regarding this.  She continues to have right shoulder pain particularly when she lifts things or vacuums.    REVIEW OF SYSTEMS: [name] generally is doing very well.  A detailed review of systems today was otherwise stable.   COVID 19 VACCINATION STATUS: fully vaccinated Therapist, music)   HISTORY OF CURRENT ILLNESS: From the original intake note:  [name] had routine screening bilateral mammography with tomography at [facility] on 07/12/2017 showing; breast density category C. There was a possible mass in the right breast. On 07/14/2017 she completed unilateral right ultrasonography at [facility] that showed an irregular hypoechoic 1.6 cm mass in the right upper outer quadrant and posterior depth. Evaluation of the right axilla showed 4 lymph nodes with cortical thickening suspicious of malignancy.   Accordingly on 07/19/2017 she proceeded to biopsy of the right breast area and 1 lymph node in question. 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  The lymph node was biopsied showing no evidence of carcinoma, which was found to be concordant.  Prognostic indicators significant for: estrogen receptor, 95% positive and progesterone receptor, 95% positive, both with strong staining intensity. Proliferation marker Ki67 at 10% . HER2 not amplified with ratios HER2/CEP17 signals 1.22 and average HER2 copies per cell 1.95  The patient's subsequent history is as detailed below.   PAST MEDICAL HISTORY: Past Medical History:  Diagnosis Date   Anxiety    Arthritis    shoulders, hands   Cancer ([place]) 07/2017   right breast cancer   Chest pain    Diverticulosis    GERD (gastroesophageal reflux disease)    High cholesterol    IBS (irritable bowel syndrome)    Kidney disorder  Pt states only has left kidney   Panic attacks    Pulmonary embolism ([place]) 2018   pt states  resolved and no longer takes blood thinners  Small pulmonary emboli - eliquis for 6 months- resolved.  Depressive and anxiety moods - xanax One functioning Kidney   PAST SURGICAL HISTORY: Past Surgical History:  Procedure Laterality Date   BREAST LUMPECTOMY WITH RADIOACTIVE SEED AND SENTINEL LYMPH NODE BIOPSY Right 08/23/2017   Procedure: BREAST LUMPECTOMY WITH RADIOACTIVE SEED AND SENTINEL LYMPH NODE BIOPSY;  Surgeon: Erroll Luna, MD;  Location: [facility];  Service: General;  Laterality: Right;   TUBAL LIGATION      FAMILY HISTORY Family History  Problem Relation Age of Onset   Alzheimer's disease Mother    Stroke Father 106   Heart attack Maternal Grandmother    Heart attack Paternal Grandmother    Cervical cancer Cousin    Colon cancer Paternal Aunt    Colon cancer Cousin    The patient's father died at age 52 due to CHF. The patient's mother died at age 49 due to Alzheimer's. The patient has 1 brother and 4 sisters. There was a paternal 1st cousin diagnosed with cervical cancer at age 45. There was a paternal aunt diagnosed with colon cancer at age 12. There was a paternal 1st cousin diagnosed with colon cancer at age 46. The patient denies a family history of breast or ovarian cancer.     GYNECOLOGIC HISTORY:  No LMP recorded. Patient is postmenopausal. Menarche: 69 years old Age at first live birth: 69 years old She is GX P1. Her LMP was in 2008. She used oral contraception with no complications. She did not used HRT.     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 Her husband, [name], died in 11/29/16 due to pancreatic cancer. He was a Furniture conservator/restorer. She lives alone with her 19 year old dog, a Web designer. The patient's daughter, [name] lives in [place] and works at [employer]. [name] has two blood grandchildren and two step-grandchildren.   ADVANCED DIRECTIVES: The patient plans to  name her daughter, [name] as her HCPOA. The appropriate forms were given at the 07/27/2017 visit.    HEALTH MAINTENANCE: Social History   Tobacco Use   Smoking status: Former Smoker    Packs/day: 20.00    Years: 1.00    Pack years: 20.00    Types: Cigarettes    Quit date: 1985    Years since quitting: 36.8   Smokeless tobacco: Never Used  Substance Use Topics   Alcohol use: Yes    Comment: social   Drug use: Never    Colonoscopy: Feb. 2019/ [facility]/ diverticulosis  PAP: Nov. 2018  Bone density: Nov. 2018 / osteopenia   Allergies  Allergen Reactions   Clindamycin Diarrhea and Nausea Only    Current Outpatient Medications  Medication Sig Dispense Refill   acetaminophen (TYLENOL) 500 MG tablet Take 1,000 mg by mouth every 6 (six) hours as needed for mild pain.     ALPRAZolam (XANAX) 0.25 MG tablet 3 (three) times daily. TAKE 1/2 TABLET IN THE MORNING AND 1/2 IN THE EVENING AT BEDTIME     anastrozole (ARIMIDEX) 1 MG tablet Take 1 tablet (1 mg total) by mouth daily. 90 tablet 12   cholecalciferol 2000 units tablet Take 2,000 Units by mouth daily.      hydrocortisone cream 1 % Apply 1 application topically as needed for itching.  hyoscyamine (ANASPAZ) 0.125 MG TBDP disintergrating tablet Take 0.125 mg by mouth every 6 (six) hours as needed.  3   omeprazole (PRILOSEC) 20 MG capsule Take 20 mg by mouth 2 (two) times daily before a meal.     No current facility-administered medications for this visit.    OBJECTIVE:  white woman who appears younger than stated age  76:   12/06/19 1320  BP: (!) 148/71  Pulse: 80  Resp: 18  Temp: 97.7 F (36.5 C)  SpO2: 95%     Body mass index is 29.23 kg/m.   Wt Readings from Last 3 Encounters:  12/06/19 165 lb (74.8 kg)  06/13/19 164 lb 11.2 oz (74.7 kg)  05/30/19 162 lb (73.5 kg)     ECOG FS:1 - Symptomatic but completely ambulatory   Sclerae unicteric, EOMs intact Wearing a mask No cervical or supraclavicular  adenopathy Lungs no rales or rhonchi Heart regular rate and rhythm Abd soft, nontender, positive bowel sounds MSK no focal spinal tenderness, no upper extremity lymphedema Neuro: nonfocal, well oriented, appropriate affect Breasts: The right breast is status post lumpectomy and radiation.  I do not palpate any abnormality of concern.  The left breast is benign.  Both axillae are benign.   LAB RESULTS:  CMP     Component Value Date/Time   NA 139 12/06/2019 1255   K 3.9 12/06/2019 1255   CL 104 12/06/2019 1255   CO2 28 12/06/2019 1255   GLUCOSE 98 12/06/2019 1255   BUN 19 12/06/2019 1255   CREATININE 0.79 12/06/2019 1255   CREATININE 0.85 08/02/2018 0904   CALCIUM 9.5 12/06/2019 1255   PROT 7.4 12/06/2019 1255   ALBUMIN 3.9 12/06/2019 1255   AST 26 12/06/2019 1255   AST 23 08/02/2018 0904   ALT 23 12/06/2019 1255   ALT 18 08/02/2018 0904   ALKPHOS 75 12/06/2019 1255   BILITOT 0.4 12/06/2019 1255   BILITOT 0.5 08/02/2018 0904   GFRNONAA >60 12/06/2019 1255   GFRNONAA >60 08/02/2018 0904   GFRAA >60 12/04/2018 1337   GFRAA >60 08/02/2018 0904    Lab Results  Component Value Date   WBC 8.1 12/06/2019   NEUTROABS 4.6 12/06/2019   HGB 13.2 12/06/2019   HCT 39.7 12/06/2019   MCV 94.1 12/06/2019   PLT 267 12/06/2019   No results found for: LABCA2  No components found for: YFVCBS496  No results for input(s): INR in the last 168 hours.  No results found for: LABCA2  No results found for: PRF163  No results found for: WGY659  No results found for: DJT701  No results found for: CA2729  No components found for: HGQUANT  No results found for: CEA1 / No results found for: CEA1   No results found for: AFPTUMOR  No results found for: CHROMOGRNA  No results found for: TOTALPROTELP, ALBUMINELP, A1GS, A2GS, BETS, BETA2SER, GAMS, MSPIKE, SPEI (this displays SPEP labs)  No results found for: KPAFRELGTCHN, LAMBDASER, KAPLAMBRATIO (kappa/lambda light chains)  No  results found for: HGBA, HGBA2QUANT, HGBFQUANT, HGBSQUAN (Hemoglobinopathy evaluation)   No results found for: LDH  No results found for: IRON, TIBC, IRONPCTSAT (Iron and TIBC)  No results found for: FERRITIN  Urinalysis    Component Value Date/Time   COLORURINE STRAW (A) 11/10/2018 [place] 11/10/2018 1242   LABSPEC <1.005 (L) 11/10/2018 1242   PHURINE 7.0 11/10/2018 [place] 11/10/2018 [place] (A) 11/10/2018 [place] 11/10/2018 1242  KETONESUR NEGATIVE 11/10/2018 1242   PROTEINUR NEGATIVE 11/10/2018 1242   NITRITE NEGATIVE 11/10/2018 1242   LEUKOCYTESUR NEGATIVE 11/10/2018 1242    STUDIES: No results found.   ELIGIBLE FOR AVAILABLE RESEARCH PROTOCOL: no   ASSESSMENT: 69 y.o. [place], [place] woman status post right breast upper outer quadrant biopsy 07/19/2017 for a clinical T1c NX, stage IA invasive ductal carcinoma, grade 2, estrogen and progesterone receptor positive, HER-2 not amplified, with an MIB-1 of 10%  (a) 1 of 4 suspicious axillary lymph nodes biopsied 07/19/2017 was negative  (1) status post right lumpectomy and sentinel lymph node sampling 08/23/2017 for a pT1c pN0, stage IA invasive ductal carcinoma, grade 2, with negative margins  (a) total of 5 sentinel lymph nodes removed  (2) The Oncotype DX score was 12, predicting a risk of outside the breast recurrence over the next 9 years of 3% if the patient's only systemic therapy is tamoxifen for 5 years.  It also predicts no significant benefit from chemotherapy.  (3) adjuvant radiation completed 09/27/2017 - 10/24/2017  Site/dose: The patient initially received a dose of 42.56 Gy in 16 fractions to the breast using whole-breast tangent fields. This was delivered using a 3-D conformal technique. The patient then received a boost to the seroma. This delivered an additional 8 Gy in 4 fractions using a 3 field photon technique due to the depth of the seroma.  The total dose was 50.56 Gy.  (4) anastrozole started 11/25/2017  (a) density 05/10/2016 showed a T score of -1.9  (b) repeat DEXA scan 06/29/2018 showed a T score of -2.3  (c) alendronate/Fosamax started 08/02/2018  (5) history of acute right lower lobe pulmonary embolus 03/19/2016; bilateral Dopplers lower extremity same day were clear  (a) status post apixaban x6 months   PLAN: Sanah is now a little over 2 years out from definitive surgery for breast cancer with no evidence of disease recurrence.  This is very favorable.  She is tolerating anastrozole generally well.  The plan will be to continue that a total of 5 years.  Unfortunately we really cannot switch to tamoxifen given her history of pulmonary embolism.  The best we can do as far as the vaginal dryness is concern is lubricants and these were discussed today.  I reassured her that she has no symptoms suggestive of interstitial lung disease and that the chest x-ray she had in August is not suggestive of it either.  If she really wanted to settle that issue we would have to do a CT scan of the chest and we discussed that.  After much discussion she opted against it because of concerns regarding false positives.  Accordingly we are resuming follow-up as before.  She will have her mammography in the late spring and see her primary care physician around that time.  She will see me again in about a year  She knows to call for any other issue that may develop before then  Total encounter time 35 minutes.Sarajane Jews C. Quintana Canelo, MD 12/06/19 2:00 PM Medical Oncology and Hematology [facility] [place], [place] [address] Tel. [phone]    Fax. [phone]   I, Wilburn Mylar, am acting as scribe for Dr. Virgie Dad. Aliou Mealey.  I, Lurline Del MD, have reviewed the above documentation for accuracy and completeness, and I agree with the above.    *Total Encounter Time as defined by the  Centers for Medicare and Medicaid Services includes, in addition to the  face-to-face time of a patient visit (documented in the note above) non-face-to-face time: obtaining and reviewing outside history, ordering and reviewing medications, tests or procedures, care coordination (communications with other health care professionals or caregivers) and documentation in the medical record.

## 2020-02-07 ENCOUNTER — Other Ambulatory Visit: Payer: Self-pay | Admitting: Oncology

## 2020-03-28 ENCOUNTER — Ambulatory Visit: Payer: Medicare Other | Admitting: Cardiovascular Disease

## 2020-04-30 ENCOUNTER — Ambulatory Visit: Payer: Medicare Other | Admitting: Cardiovascular Disease

## 2020-06-06 ENCOUNTER — Ambulatory Visit: Payer: Medicare Other | Admitting: Cardiovascular Disease

## 2020-06-06 ENCOUNTER — Encounter: Payer: Self-pay | Admitting: Cardiovascular Disease

## 2020-06-06 ENCOUNTER — Other Ambulatory Visit: Payer: Self-pay

## 2020-06-06 VITALS — BP 115/72 | HR 69 | Ht 63.0 in | Wt 150.0 lb

## 2020-06-06 DIAGNOSIS — E7800 Pure hypercholesterolemia, unspecified: Secondary | ICD-10-CM | POA: Diagnosis not present

## 2020-06-06 DIAGNOSIS — R002 Palpitations: Secondary | ICD-10-CM

## 2020-06-06 LAB — HEPATIC FUNCTION PANEL
ALT: 12 IU/L (ref 0–32)
AST: 21 IU/L (ref 0–40)
Albumin: 4.7 g/dL (ref 3.8–4.8)
Alkaline Phosphatase: 84 IU/L (ref 44–121)
Bilirubin Total: 0.4 mg/dL (ref 0.0–1.2)
Bilirubin, Direct: 0.1 mg/dL (ref 0.00–0.40)
Total Protein: 7.4 g/dL (ref 6.0–8.5)

## 2020-06-06 LAB — LIPID PANEL
Chol/HDL Ratio: 4 ratio (ref 0.0–4.4)
Cholesterol, Total: 255 mg/dL — ABNORMAL HIGH (ref 100–199)
HDL: 64 mg/dL (ref >39)
LDL Chol Calc (NIH): 178 mg/dL — ABNORMAL HIGH (ref 0–99)
Triglycerides: 78 mg/dL (ref 0–149)
VLDL Cholesterol Cal: 13 mg/dL (ref 5–40)

## 2020-06-06 NOTE — Assessment & Plan Note (Signed)
History of hyperlipidemia intolerant to statin therapy.  Her last lipid profile performed 07/10/2018 revealed total cholesterol 223, LDL of 140 and HDL of 62.  We will recheck a lipid liver profile.

## 2020-06-06 NOTE — Progress Notes (Signed)
06/06/2020 Melissa [name]   03/20/1950  [id number]  Primary Physician Aletha Halim., PA-C Primary Cardiologist: Lorretta Harp MD Garret Reddish, [place], [place]  HPI:  Melissa [name] is a 70 y.o.  moderately overweight widowed Caucasian female (husband of 53 years died of cancer in 09-May-2016), mother of 1, grandmother of 2 grandchildren referred by her PCP ([facility]) because of palpitations. 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   She also had breast cancer and is now 3 years in remission.  I last saw her in the office 03/27/2019.She has seen Dr. Irving Burton the past who performed routine GXT/3/19 that was entirely normal for atypical chest pain. Risk factors include hyperlipidemia intolerant to statin therapy. She is never had a heart attack or stroke. There is no family history for heart disease. She was diagnosed with breast cancer 2017/05/09 and had radiation therapy followed by Dr. Jana Hakim. She does admit to having panic attacks and anxiety which has been more exacerbated since the loss of her husband. She saw her PCP on 01/16/2019 which time an EKG showed sinus rhythm with PACs. Thyroid function tests were normal. She does not drink alcohol or caffeine.  She had an event monitor that showed PVCs with short runs of SVT and a 2D echo that was essentially normal. She does admit to anxiety and stress and has since made a "special friend" and is admittedly happier and asymptomatic.   Since I saw her a year ago she continues to do well.  She is now 3 years cancer free from having had breast cancer.  She denies chest pain or shortness of breath.  She is had no further palpitations.  She did have a lipid profile performed 07/10/2018 that revealed total cholesterol of 223, LDL of 140 and HDL 62.  Current Meds  Medication Sig  . acetaminophen (TYLENOL) 500 MG tablet Take 1,000 mg by mouth every 6 (six) hours as needed for mild pain.  Marland Kitchen ALPRAZolam (XANAX) 0.25 MG tablet 3  (three) times daily. TAKE 1/2 TABLET IN THE MORNING AND 1/2 IN THE EVENING AT BEDTIME  . anastrozole (ARIMIDEX) 1 MG tablet TAKE 1 TABLET BY MOUTH EVERY DAY  . cholecalciferol 2000 units tablet Take 2,000 Units by mouth daily.   . hydrocortisone cream 1 % Apply 1 application topically as needed for itching.  . hyoscyamine (ANASPAZ) 0.125 MG TBDP disintergrating tablet Take 0.125 mg by mouth every 6 (six) hours as needed.  Marland Kitchen omeprazole (PRILOSEC) 20 MG capsule Take 20 mg by mouth 2 (two) times daily before a meal.     Allergies  Allergen Reactions  . Clindamycin Diarrhea and Nausea Only    Social History   Socioeconomic History  . Marital status: Widowed    Spouse name: Not on file  . Number of children: Not on file  . Years of education: Not on file  . Highest education level: Not on file  Occupational History  . Not on file  Tobacco Use  . Smoking status: Former Smoker    Packs/day: 20.00    Years: 1.00    Pack years: 20.00    Types: Cigarettes    Quit date: 1985    Years since quitting: 37.3  . Smokeless tobacco: Never Used  Substance and Sexual Activity  . Alcohol use: Yes    Comment: social  . Drug use: Never  . Sexual activity: Not Currently  Other Topics Concern  . Not on file  Social History  Narrative   09-13-17 Unable to ask abuse questions daughter with her today.   Social Determinants of Health   Financial Resource Strain: Not on file  Food Insecurity: Not on file  Transportation Needs: Not on file  Physical Activity: Not on file  Stress: Not on file  Social Connections: Not on file  Intimate Partner Violence: Not on file     Review of Systems: General: negative for chills, fever, night sweats or weight changes.  Cardiovascular: negative for chest pain, dyspnea on exertion, edema, orthopnea, palpitations, paroxysmal nocturnal dyspnea or shortness of breath Dermatological: negative for rash Respiratory: negative for cough or wheezing Urologic:  negative for hematuria Abdominal: negative for nausea, vomiting, diarrhea, bright red blood per rectum, melena, or hematemesis Neurologic: negative for visual changes, syncope, or dizziness All other systems reviewed and are otherwise negative except as noted above.    Blood pressure 115/72, pulse 69, height 5\' 3"  (1.6 m), weight 150 lb (68 kg), SpO2 99 %.  General appearance: alert and no distress Neck: no adenopathy, no carotid bruit, no JVD, supple, symmetrical, trachea midline and thyroid not enlarged, symmetric, no tenderness/mass/nodules Lungs: clear to auscultation bilaterally Heart: regular rate and rhythm, S1, S2 normal, no murmur, click, rub or gallop Extremities: extremities normal, atraumatic, no cyanosis or edema Pulses: 2+ and symmetric Skin: Skin color, texture, turgor normal. No rashes or lesions Neurologic: Alert and oriented X 3, normal strength and tone. Normal symmetric reflexes. Normal coordination and gait  EKG sinus rhythm at 69 without ST or T wave changes.  Personally reviewed this EKG.  ASSESSMENT AND PLAN:   Palpitations History of palpitations in the past with event monitoring performed 02/01/2019 showing occasional PVCs with short runs of SVT.  She did admit that this was probably related to stress and anxiety which have since resolved.  Pure hypercholesterolemia History of hyperlipidemia intolerant to statin therapy.  Her last lipid profile performed 07/10/2018 revealed total cholesterol 223, LDL of 140 and HDL of 62.  We will recheck a lipid liver profile.      Lorretta Harp MD FACP,FACC,FAHA, [facility] 06/06/2020 9:07 AM

## 2020-06-06 NOTE — Assessment & Plan Note (Signed)
History of palpitations in the past with event monitoring performed 02/01/2019 showing occasional PVCs with short runs of SVT.  She did admit that this was probably related to stress and anxiety which have since resolved.

## 2020-06-06 NOTE — Patient Instructions (Signed)
Medication Instructions:  No Changes In Medications at this time.  *If you need a refill on your cardiac medications before your next appointment, please call your pharmacy*  Lab Work: Cholesterol/Liver blood work today  If you have labs (blood work) drawn today and your tests are completely normal, you will receive your results only by: Marland Kitchen MyChart Message (if you have MyChart) OR . A paper copy in the mail If you have any lab test that is abnormal or we need to change your treatment, we will call you to review the results.  Follow-Up: At [facility], you and your health needs are our priority.  As part of our continuing mission to provide you with exceptional heart care, we have created designated Provider Care Teams.  These Care Teams include your primary Cardiologist (physician) and Advanced Practice Providers (APPs -  Physician Assistants and Nurse Practitioners) who all work together to provide you with the care you need, when you need it.  Your next appointment:   12 month(s)  The format for your next appointment:   In Person  Provider:   Quay Burow, MD

## 2020-06-19 ENCOUNTER — Encounter: Payer: Self-pay | Admitting: Oncology

## 2020-07-02 ENCOUNTER — Telehealth: Payer: Self-pay | Admitting: Cardiovascular Disease

## 2020-07-02 NOTE — Telephone Encounter (Signed)
Patient states when she was in the office her cholesterol was elevated at 255. She states the nurse said she will need to schedule an appointment. She states since then she saw her PCP and states her cholesterol has dropped to 235. She would like to know if she can just continue to lower it with diet and exercise

## 2020-07-02 NOTE — Telephone Encounter (Signed)
Returned call to patient who states that her Cholesterol at her recent OV with Dr. Gwenlyn Found her total cholesterol was 255 and she states that she has been working really hard on her diet and exercise and states that her Total cholesterol on 6/6 with her PCP was down to 235. Patient would like to know what Dr. Gwenlyn Found thinks about her just focusing on diet and exercise before discussing medication. Advised patient that I would forward message to Dr. Gwenlyn Found for him to review and advise. Patient verbalized understanding.

## 2020-07-02 NOTE — Telephone Encounter (Signed)
Returned call to patient, made patient aware of Dr. Kennon Holter reccomendations. Patient states she is slightly hesitant to get CT due to breast cancer radiation treatments.   Spoke with Dr. Audie Box who states that Calcium score is equivalent to getting a Chest Xray. Made patient aware of this, patient states she is interested but would like to wait to order scan and will call back when she is ready.   Advised patient I would forward message to Dr. Kennon Holter nurse for when patient is ready to order and schedule calcium score. Patient verbalized understanding.

## 2020-11-03 ENCOUNTER — Telehealth: Payer: Self-pay | Admitting: *Deleted

## 2020-11-03 NOTE — Telephone Encounter (Signed)
This RN spoke with pt per call stating onset of " severe pain last night ".  She states she developed abd pain with nausea ( no vomiting ) - and is concerned because " Dr Jana Hakim had mentioned concern about getting scans and exposure to radiation."  This RN discussed above - concerns- she states she has had bowel changes

## 2020-12-10 NOTE — Progress Notes (Signed)
[place]  Telephone:([phone] Fax:([phone]) [phone]     ID: Melissa [name] DOB: [birth year]  MR#: [id number]  URK#:[id number]  Patient Care Team: Aletha Halim., PA-C as PCP - General (Family Medicine) End, Harrell Gave, MD as PCP - Cardiology (Cardiology) Erroll Luna, MD as Consulting Physician (General Surgery) Leonila Speranza, Virgie Dad, MD as Consulting Physician (Oncology) Kyung Rudd, MD as Consulting Physician (Radiation Oncology) Christain Sacramento, MD as Consulting Physician (Family Medicine) Aletha Halim., PA-C as Physician Assistant (Family Medicine) Richmond Campbell, MD as Consulting Physician (Gastroenterology) Delice Bison, Charlestine Massed, NP as Nurse Practitioner (Hematology and Oncology) OTHER MD:    CHIEF COMPLAINT: Estrogen receptor positive breast cancer  CURRENT TREATMENT: Anastrozole; alendronate   INTERVAL HISTORY: Melissa [name] returns today for follow up of her estrogen receptor positive breast cancer.  She is accompanied by her significant other chock.  She continues on anastrozole.   She has moderate hot flashes.  She continues to have vaginal dryness and is using Replens intermittently..  She participated in our pelvic rehab program but did not find it useful.    Since her last visit, she underwent bilateral diagnostic mammography with tomography at [facility] on 06/17/2020 showing: breast density category C; no evidence of malignancy in either breast.   REVIEW OF SYSTEMS: Sehaj was having some pain particularly in the right breast.  She stopped using underwire bras and that has helped.  She is having some reflux and other symptoms.  She is followed by Dr. Earlean Shawl for this.  He changed her to pantoprazole and is planning a colonoscopy in March.  She tells me her cholesterol is not well controlled and her cardiologist has suggested a cardiac calcium score MRI.  She is not walking as much as I would like at this point.  Aside from these issues a detailed  review of systems today was stable   COVID 19 VACCINATION STATUS: fully vaccinated [employer]), status post 1 booster  HISTORY OF CURRENT ILLNESS: From the original intake note:  Melissa [name] had routine screening bilateral mammography with tomography at [facility] on 07/12/2017 showing; breast density category C. There was a possible mass in the right breast. On 07/14/2017 she completed unilateral right ultrasonography at [facility] that showed an irregular hypoechoic 1.6 cm mass in the right upper outer quadrant and posterior depth. Evaluation of the right axilla showed 4 lymph nodes with cortical thickening suspicious of malignancy.   Accordingly on 07/19/2017 she proceeded to biopsy of the right breast area and 1 lymph node in question. 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  The lymph node was biopsied showing no evidence of carcinoma, which was found to be concordant.  Prognostic indicators significant for: estrogen receptor, 95% positive and progesterone receptor, 95% positive, both with strong staining intensity. Proliferation marker Ki67 at 10% . HER2 not amplified with ratios HER2/CEP17 signals 1.22 and average HER2 copies per cell 1.95  The patient's subsequent history is as detailed below.   PAST MEDICAL HISTORY: Past Medical History:  Diagnosis Date   Anxiety    Arthritis    shoulders, hands   Cancer ([place]) 07/2017   right breast cancer   Chest pain    Diverticulosis    GERD (gastroesophageal reflux disease)    High cholesterol    IBS (irritable bowel syndrome)    Kidney disorder    Pt states only has left kidney   Panic attacks    Pulmonary embolism ([place])  2018   pt states resolved and no longer takes blood thinners  Small pulmonary emboli - eliquis for 6 months- resolved.  Depressive and anxiety moods - xanax One functioning Kidney   PAST SURGICAL HISTORY: Past Surgical History:   Procedure Laterality Date   BREAST LUMPECTOMY WITH RADIOACTIVE SEED AND SENTINEL LYMPH NODE BIOPSY Right 08/23/2017   Procedure: BREAST LUMPECTOMY WITH RADIOACTIVE SEED AND SENTINEL LYMPH NODE BIOPSY;  Surgeon: Erroll Luna, MD;  Location: [place];  Service: General;  Laterality: Right;   TUBAL LIGATION      FAMILY HISTORY Family History  Problem Relation Age of Onset   Alzheimer's disease Mother    Stroke Father 32   Heart attack Maternal Grandmother    Heart attack Paternal Grandmother    Cervical cancer Cousin    Colon cancer Paternal Aunt    Colon cancer Cousin   The patient's father died at age 46 due to CHF. The patient's mother died at age 28 due to Alzheimer's. The patient has 1 brother and 4 sisters. There was a paternal 1st cousin diagnosed with cervical cancer at age 37. There was a paternal aunt diagnosed with colon cancer at age 35. There was a paternal 1st cousin diagnosed with colon cancer at age 34. The patient denies a family history of breast or ovarian cancer.     GYNECOLOGIC HISTORY:  No LMP recorded. Patient is postmenopausal. Menarche: 70 years old Age at first live birth: 70 years old She is GX P1. Her LMP was in 2008. She used oral contraception with no complications. She did not used HRT.     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 Her husband, Melissa [name], died in 2016-12-06 due to pancreatic cancer. He was a Furniture conservator/restorer. She lives alone with her 37 year old dog, a Web designer. The patient's daughter, Melissa [name] lives in [place] and works at [employer]. Melissa [name] has two blood grandchildren and two step-grandchildren.   ADVANCED DIRECTIVES: The patient plans to name her daughter, Melissa [name] as her HCPOA. The appropriate forms were given at the 07/27/2017 visit.    HEALTH MAINTENANCE: Social History   Tobacco Use   Smoking status: Former    Packs/day: 20.00    Years: 1.00    Pack  years: 20.00    Types: Cigarettes    Quit date: 1985    Years since quitting: 37.9   Smokeless tobacco: Never  Substance Use Topics   Alcohol use: Yes    Comment: social   Drug use: Never    Colonoscopy: Feb. 2019/ [facility]/ diverticulosis  PAP: Nov. 2018  Bone density: Nov. 2018 / osteopenia   Allergies  Allergen Reactions   Clindamycin Diarrhea and Nausea Only    Current Outpatient Medications  Medication Sig Dispense Refill   acetaminophen (TYLENOL) 500 MG tablet Take 1,000 mg by mouth every 6 (six) hours as needed for mild pain.     ALPRAZolam (XANAX) 0.25 MG tablet 3 (three) times daily. TAKE 1/2 TABLET IN THE MORNING AND 1/2 IN THE EVENING AT BEDTIME     anastrozole (ARIMIDEX) 1 MG tablet TAKE 1 TABLET BY MOUTH EVERY DAY 90 tablet 12   cholecalciferol 2000 units tablet Take 2,000 Units by mouth daily.      hydrocortisone cream 1 % Apply 1 application topically as needed for itching.     hyoscyamine (ANASPAZ) 0.125 MG TBDP disintergrating tablet Take 0.125 mg by mouth every 6 (six) hours as needed.  3   omeprazole (PRILOSEC) 20 MG capsule Take 20 mg by mouth 2 (two) times daily before a meal.     No current facility-administered medications for this visit.    OBJECTIVE:  white woman who appears younger than stated age  63:   12/11/20 1016  BP: (!) 159/80  Pulse: 77  Resp: 18  Temp: 98.4 F (36.9 C)  SpO2: 99%      Body mass index is 26.39 kg/m.   Wt Readings from Last 3 Encounters:  12/11/20 149 lb (67.6 kg)  06/06/20 150 lb (68 kg)  12/06/19 165 lb (74.8 kg)     ECOG FS:1 - Symptomatic but completely ambulatory   Sclerae unicteric, EOMs intact Wearing a mask No cervical or supraclavicular adenopathy Lungs no rales or rhonchi Heart regular rate and rhythm Abd soft, nontender, positive bowel sounds MSK no focal spinal tenderness, no upper extremity lymphedema Neuro: nonfocal, well oriented, appropriate affect Breasts: The right breast is status  postlumpectomy and radiation.  There is no evidence of local recurrence.  The left breast is benign.  Both axillae are benign   LAB RESULTS:  CMP     Component Value Date/Time   NA 139 12/06/2019 1255   K 3.9 12/06/2019 1255   CL 104 12/06/2019 1255   CO2 28 12/06/2019 1255   GLUCOSE 98 12/06/2019 1255   BUN 19 12/06/2019 1255   CREATININE 0.79 12/06/2019 1255   CREATININE 0.85 08/02/2018 0904   CALCIUM 9.5 12/06/2019 1255   PROT 7.4 06/06/2020 1010   ALBUMIN 4.7 06/06/2020 1010   AST 21 06/06/2020 1010   AST 23 08/02/2018 0904   ALT 12 06/06/2020 1010   ALT 18 08/02/2018 0904   ALKPHOS 84 06/06/2020 1010   BILITOT 0.4 06/06/2020 1010   BILITOT 0.5 08/02/2018 0904   GFRNONAA >60 12/06/2019 1255   GFRNONAA >60 08/02/2018 0904   GFRAA >60 12/04/2018 1337   GFRAA >60 08/02/2018 0904    Lab Results  Component Value Date   WBC 9.2 12/11/2020   NEUTROABS 5.4 12/11/2020   HGB 13.3 12/11/2020   HCT 39.6 12/11/2020   MCV 94.7 12/11/2020   PLT 297 12/11/2020   No results found for: LABCA2  No components found for: OZYYQM250  No results for input(s): INR in the last 168 hours.  No results found for: LABCA2  No results found for: IBB048  No results found for: GQB169  No results found for: IHW388  No results found for: CA2729  No components found for: HGQUANT  No results found for: CEA1 / No results found for: CEA1   No results found for: AFPTUMOR  No results found for: CHROMOGRNA  No results found for: TOTALPROTELP, ALBUMINELP, A1GS, A2GS, BETS, BETA2SER, GAMS, MSPIKE, SPEI (this displays SPEP labs)  No results found for: KPAFRELGTCHN, LAMBDASER, KAPLAMBRATIO (kappa/lambda light chains)  No results found for: HGBA, HGBA2QUANT, HGBFQUANT, HGBSQUAN (Hemoglobinopathy evaluation)   No results found for: LDH  No results found for: IRON, TIBC, IRONPCTSAT (Iron and TIBC)  No results found for: FERRITIN  Urinalysis    Component Value Date/Time    COLORURINE STRAW (A) 11/10/2018 [place] 11/10/2018 1242   LABSPEC <1.005 (L) 11/10/2018 1242   PHURINE 7.0 11/10/2018 1242   [place] 11/10/2018 1242   HGBUR TRACE (A) 11/10/2018 1242   BILIRUBINUR NEGATIVE 11/10/2018 [place] 11/10/2018 [place] 11/10/2018 1242   NITRITE NEGATIVE 11/10/2018 1242   [place]  11/10/2018 1242    STUDIES: No results found.   ELIGIBLE FOR AVAILABLE RESEARCH PROTOCOL: no   ASSESSMENT: 70 y.o. [place], [place] woman status post right breast upper outer quadrant biopsy 07/19/2017 for a clinical T1c NX, stage IA invasive ductal carcinoma, grade 2, estrogen and progesterone receptor positive, HER-2 not amplified, with an MIB-1 of 10%  (a) 1 of 4 suspicious axillary lymph nodes biopsied 07/19/2017 was negative  (1) status post right lumpectomy and sentinel lymph node sampling 08/23/2017 for a pT1c pN0, stage IA invasive ductal carcinoma, grade 2, with negative margins  (a) total of 5 sentinel lymph nodes removed  (2) The Oncotype DX score was 12, predicting a risk of outside the breast recurrence over the next 9 years of 3% if the patient's only systemic therapy is tamoxifen for 5 years.  It also predicts no significant benefit from chemotherapy.  (3) adjuvant radiation completed 09/27/2017 - 10/24/2017  Site/dose: The patient initially received a dose of 42.56 Gy in 16 fractions to the breast using whole-breast tangent fields. This was delivered using a 3-D conformal technique. The patient then received a boost to the seroma. This delivered an additional 8 Gy in 4 fractions using a 3 field photon technique due to the depth of the seroma. The total dose was 50.56 Gy.  (4) anastrozole started 11/25/2017  (a) density 05/10/2016 showed a T score of -1.9  (b) repeat DEXA scan 06/29/2018 showed a T score of -2.3  (c) alendronate/Fosamax started 08/02/2018  (5) history of acute right lower lobe  pulmonary embolus 03/19/2016; bilateral Dopplers lower extremity same day were clear  (a) status post apixaban x6 months   PLAN: Wilburta is now a little over 3 years out from definitive surgery for her breast cancer with no evidence of disease recurrence.  This is very favorable.  She is tolerating anastrozole well and the plan will be to continue that a minimum of 5 years.  I suggest that she try the Replens 3 times a week until there is optimal recovery of the local tissue and then go to twice a week.  Unfortunately we cannot use local estrogens at this point.  That might become an option after she goes off anastrozole  I encouraged her to walk 45 minutes a day at least 5 days a week.  I think she would benefit from low-dose venlafaxine and we are starting that today.  She has a good understanding of the possible toxicities side effects and complications and in particular she knows not to stop the medication suddenly.  If she wishes to go off it after a while she will call us and we will give her a taper.  She will see [place] again in a year for routine follow-up.  Total encounter time 35 minutes.Sarajane Jews C. Magrinat, MD 12/11/20 10:39 AM Medical Oncology and Hematology [facility] [place], [place] [address] Tel. [phone]    Fax. [phone]   I, Wilburn Mylar, am acting as scribe for Dr. Virgie Dad. Magrinat.  I, Lurline Del MD, have reviewed the above documentation for accuracy and completeness, and I agree with the above.   *Total Encounter Time as defined by the Centers for Medicare and Medicaid Services includes, in addition to the face-to-face time of a patient visit (documented in the note above) non-face-to-face time: obtaining and reviewing outside history, ordering and reviewing medications, tests or procedures, care coordination (communications with other health care professionals or caregivers) and documentation  in the medical  record.

## 2020-12-11 ENCOUNTER — Inpatient Hospital Stay: Payer: Medicare Other | Attending: Oncology | Admitting: Oncology

## 2020-12-11 ENCOUNTER — Inpatient Hospital Stay: Payer: Medicare Other

## 2020-12-11 ENCOUNTER — Other Ambulatory Visit: Payer: Self-pay

## 2020-12-11 VITALS — BP 159/80 | HR 77 | Temp 98.4°F | Resp 18 | Ht 63.0 in | Wt 149.0 lb

## 2020-12-11 DIAGNOSIS — Z86711 Personal history of pulmonary embolism: Secondary | ICD-10-CM | POA: Insufficient documentation

## 2020-12-11 DIAGNOSIS — Z823 Family history of stroke: Secondary | ICD-10-CM | POA: Diagnosis not present

## 2020-12-11 DIAGNOSIS — Z800 Family history of malignant neoplasm of digestive organs: Secondary | ICD-10-CM | POA: Insufficient documentation

## 2020-12-11 DIAGNOSIS — Z170 Estrogen receptor positive status [ER+]: Secondary | ICD-10-CM

## 2020-12-11 DIAGNOSIS — Z79899 Other long term (current) drug therapy: Secondary | ICD-10-CM | POA: Diagnosis not present

## 2020-12-11 DIAGNOSIS — C50411 Malignant neoplasm of upper-outer quadrant of right female breast: Secondary | ICD-10-CM | POA: Diagnosis present

## 2020-12-11 DIAGNOSIS — Z8249 Family history of ischemic heart disease and other diseases of the circulatory system: Secondary | ICD-10-CM | POA: Diagnosis not present

## 2020-12-11 DIAGNOSIS — Z8049 Family history of malignant neoplasm of other genital organs: Secondary | ICD-10-CM | POA: Insufficient documentation

## 2020-12-11 DIAGNOSIS — N6489 Other specified disorders of breast: Secondary | ICD-10-CM | POA: Diagnosis not present

## 2020-12-11 DIAGNOSIS — Z818 Family history of other mental and behavioral disorders: Secondary | ICD-10-CM | POA: Diagnosis not present

## 2020-12-11 DIAGNOSIS — R232 Flushing: Secondary | ICD-10-CM | POA: Diagnosis not present

## 2020-12-11 DIAGNOSIS — E7800 Pure hypercholesterolemia, unspecified: Secondary | ICD-10-CM | POA: Diagnosis not present

## 2020-12-11 DIAGNOSIS — Z79811 Long term (current) use of aromatase inhibitors: Secondary | ICD-10-CM | POA: Insufficient documentation

## 2020-12-11 DIAGNOSIS — Z923 Personal history of irradiation: Secondary | ICD-10-CM | POA: Insufficient documentation

## 2020-12-11 DIAGNOSIS — Z881 Allergy status to other antibiotic agents status: Secondary | ICD-10-CM | POA: Diagnosis not present

## 2020-12-11 DIAGNOSIS — Z87891 Personal history of nicotine dependence: Secondary | ICD-10-CM | POA: Insufficient documentation

## 2020-12-11 DIAGNOSIS — R0789 Other chest pain: Secondary | ICD-10-CM

## 2020-12-11 DIAGNOSIS — I2690 Septic pulmonary embolism without acute cor pulmonale: Secondary | ICD-10-CM

## 2020-12-11 LAB — CBC WITH DIFFERENTIAL/PLATELET
Abs Immature Granulocytes: 0.03 10*3/uL (ref 0.00–0.07)
Basophils Absolute: 0.1 10*3/uL (ref 0.0–0.1)
Basophils Relative: 1 %
Eosinophils Absolute: 0.2 10*3/uL (ref 0.0–0.5)
Eosinophils Relative: 2 %
HCT: 39.6 % (ref 36.0–46.0)
Hemoglobin: 13.3 g/dL (ref 12.0–15.0)
Immature Granulocytes: 0 %
Lymphocytes Relative: 28 %
Lymphs Abs: 2.6 10*3/uL (ref 0.7–4.0)
MCH: 31.8 pg (ref 26.0–34.0)
MCHC: 33.6 g/dL (ref 30.0–36.0)
MCV: 94.7 fL (ref 80.0–100.0)
Monocytes Absolute: 0.9 10*3/uL (ref 0.1–1.0)
Monocytes Relative: 9 %
Neutro Abs: 5.4 10*3/uL (ref 1.7–7.7)
Neutrophils Relative %: 60 %
Platelets: 297 10*3/uL (ref 150–400)
RBC: 4.18 MIL/uL (ref 3.87–5.11)
RDW: 12.4 % (ref 11.5–15.5)
WBC: 9.2 10*3/uL (ref 4.0–10.5)
nRBC: 0 % (ref 0.0–0.2)

## 2020-12-11 LAB — COMPREHENSIVE METABOLIC PANEL
ALT: 13 U/L (ref 0–44)
AST: 20 U/L (ref 15–41)
Albumin: 4.2 g/dL (ref 3.5–5.0)
Alkaline Phosphatase: 71 U/L (ref 38–126)
Anion gap: 11 (ref 5–15)
BUN: 14 mg/dL (ref 8–23)
CO2: 27 mmol/L (ref 22–32)
Calcium: 9.7 mg/dL (ref 8.9–10.3)
Chloride: 104 mmol/L (ref 98–111)
Creatinine, Ser: 0.8 mg/dL (ref 0.44–1.00)
GFR, Estimated: >60 mL/min (ref >60)
Glucose, Bld: 97 mg/dL (ref 70–99)
Potassium: 3.5 mmol/L (ref 3.5–5.1)
Sodium: 142 mmol/L (ref 135–145)
Total Bilirubin: 0.5 mg/dL (ref 0.3–1.2)
Total Protein: 7.7 g/dL (ref 6.5–8.1)

## 2020-12-11 MED ORDER — VENLAFAXINE HCL ER 37.5 MG PO CP24: 37.5000 mg | ORAL_CAPSULE | Freq: Every day | ORAL | 4 refills | Status: DC

## 2020-12-11 MED ORDER — ANASTROZOLE 1 MG PO TABS: 1.0000 mg | ORAL_TABLET | Freq: Every day | ORAL | 4 refills | Status: DC

## 2021-01-16 IMAGING — DX DG CERVICAL SPINE 2 OR 3 VIEWS
4 series · 4 of 4 positions shown · non-contrast
Comparison: None.

CLINICAL DATA: Right-sided neck pain radiating to the shoulder.
History of breast cancer.

EXAM:
CERVICAL SPINE - 2-3 VIEW

[c-spine lat]
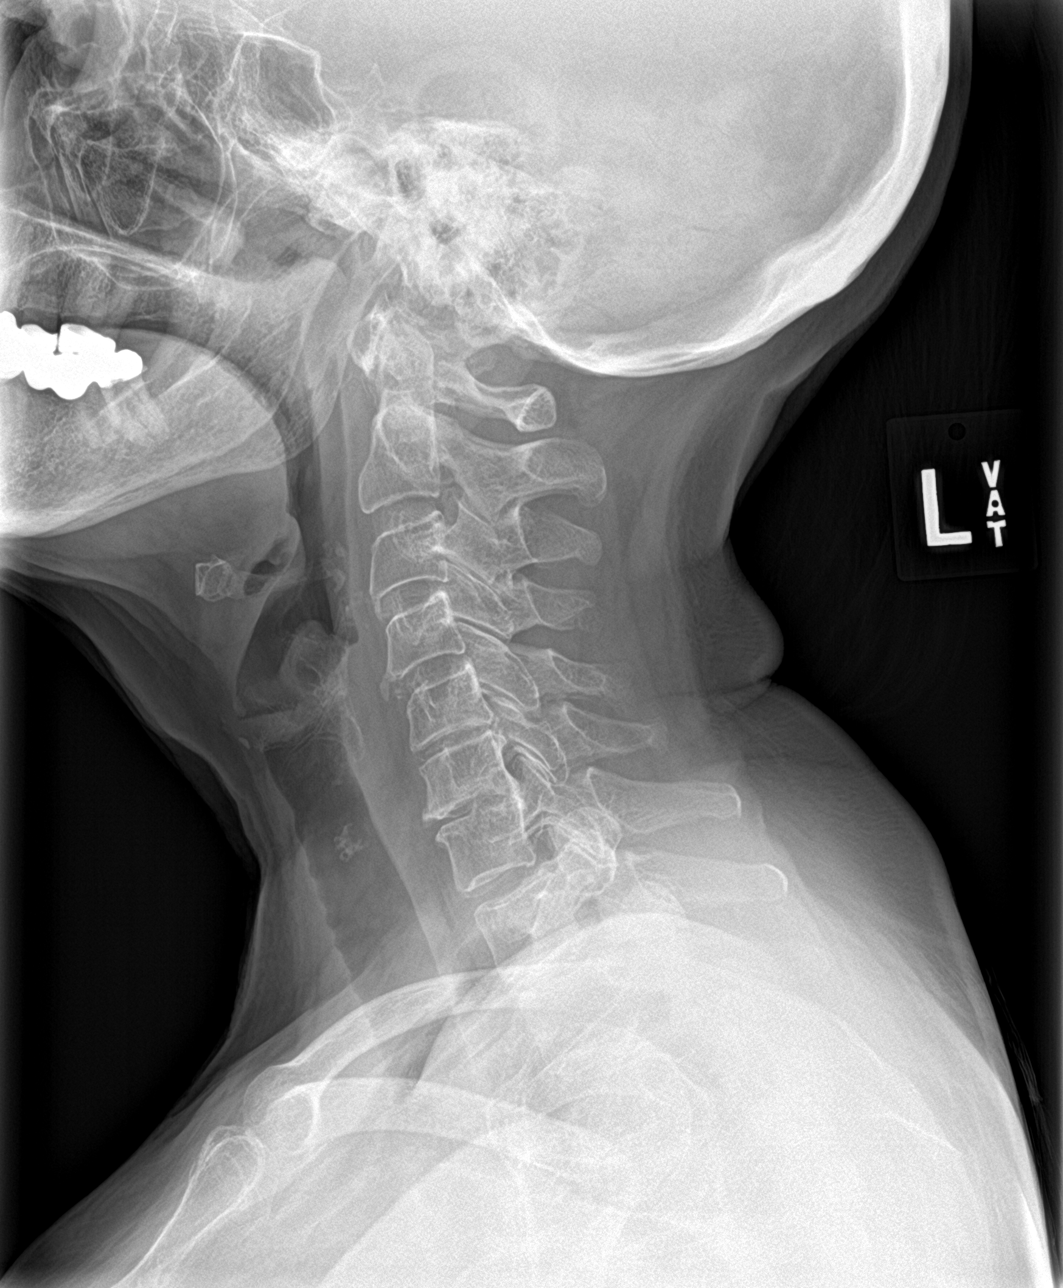

[c-spine ap]
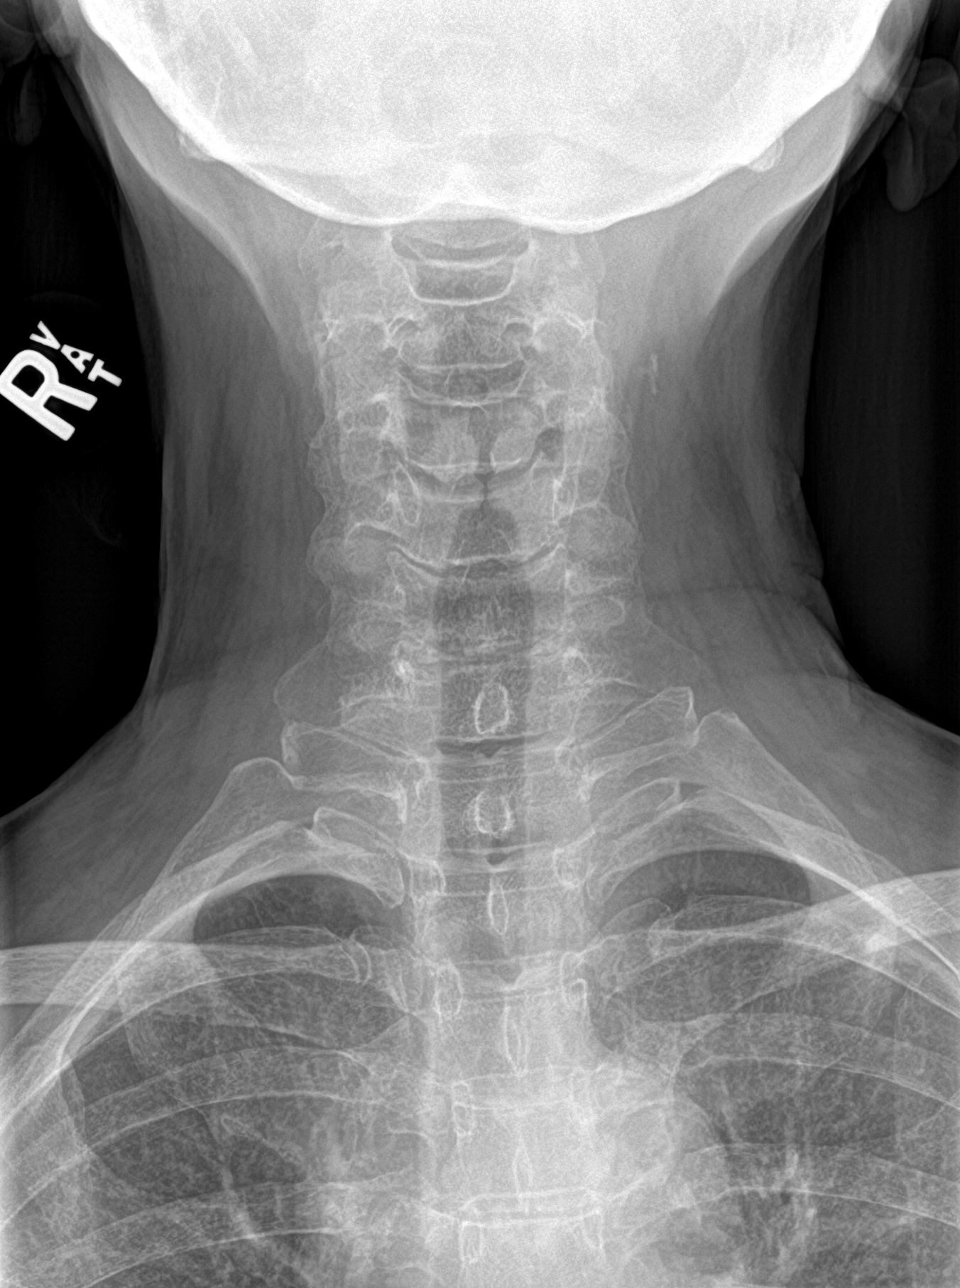

[c-spine open mouth]
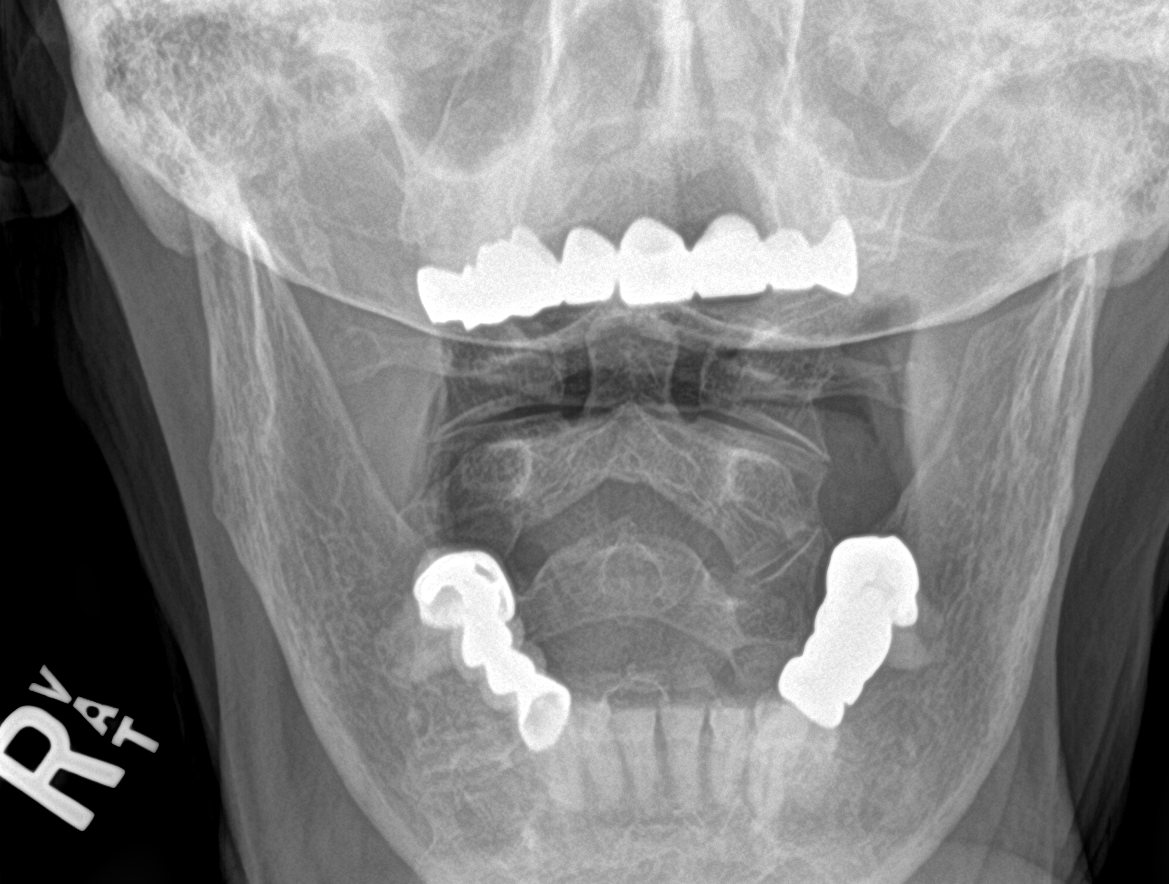

[c-spine swimmers]
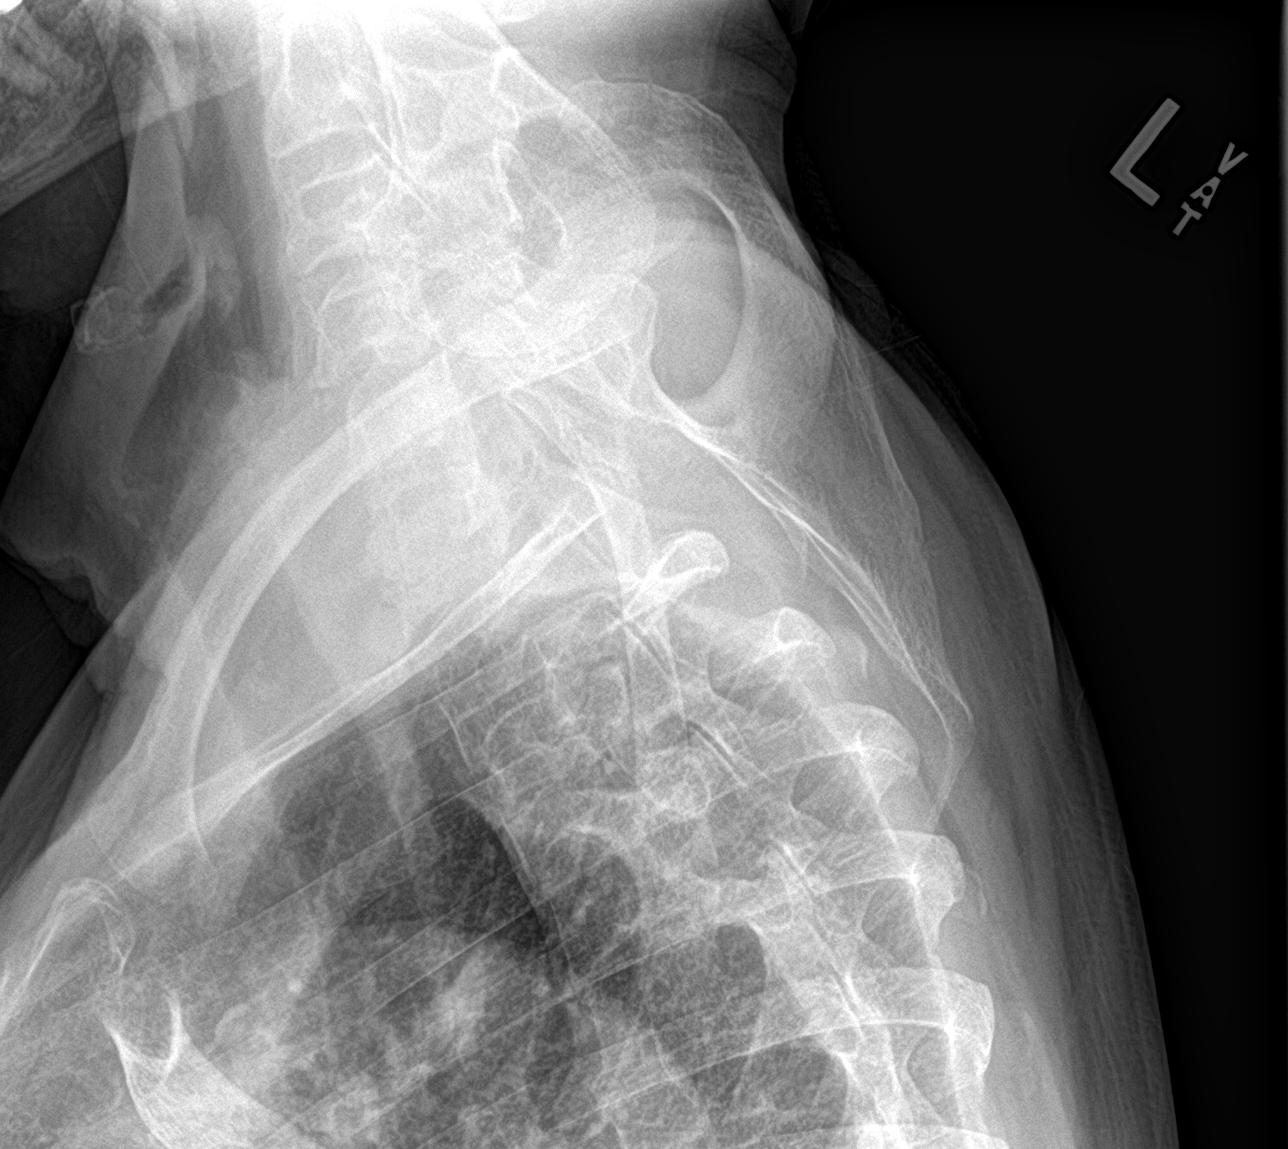

[4 of 4 positions shown; findings below may reference images not displayed]

FINDINGS: There is straightening of the normal cervical lordosis, and there is
trace retrolisthesis of C3 on C4 and trace anterolisthesis of C7 on
T1. No fracture is identified. There is moderate disc space
narrowing at C5-6 and C6-7 with mild degenerative endplate sclerosis
and spurring at the latter. The prevertebral soft tissues are within
normal limits. The visualized lung apices are clear.
IMPRESSION: 1. No acute osseous abnormality identified.
2. Disc degeneration at greatest at C6-7.

## 2021-01-16 IMAGING — DX DG SHOULDER 2+V*R*
3 series · 3 of 3 positions shown · non-contrast
Comparison: 05/04/2016

CLINICAL DATA: Right-sided neck pain radiating to the shoulder.
History of breast cancer.

EXAM:
RIGHT SHOULDER - 2+ VIEW

[shoulder grashey]
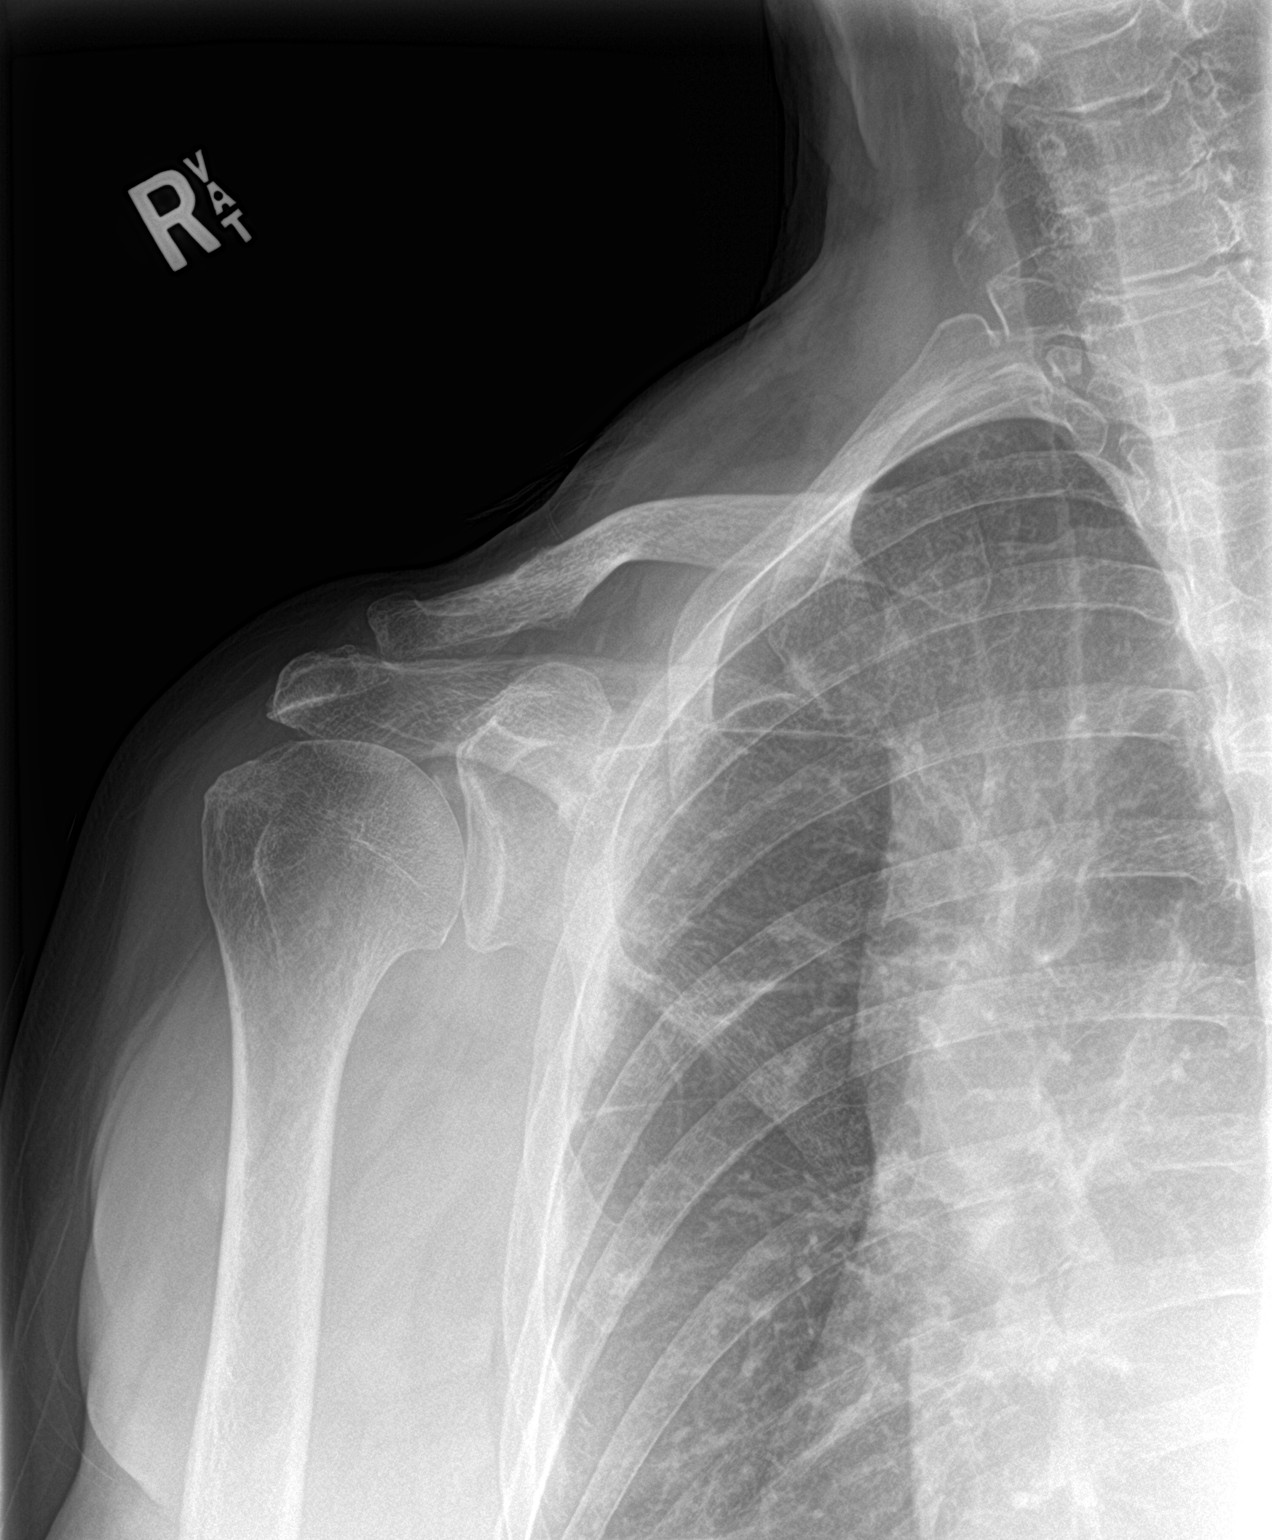

[shoulder y view]
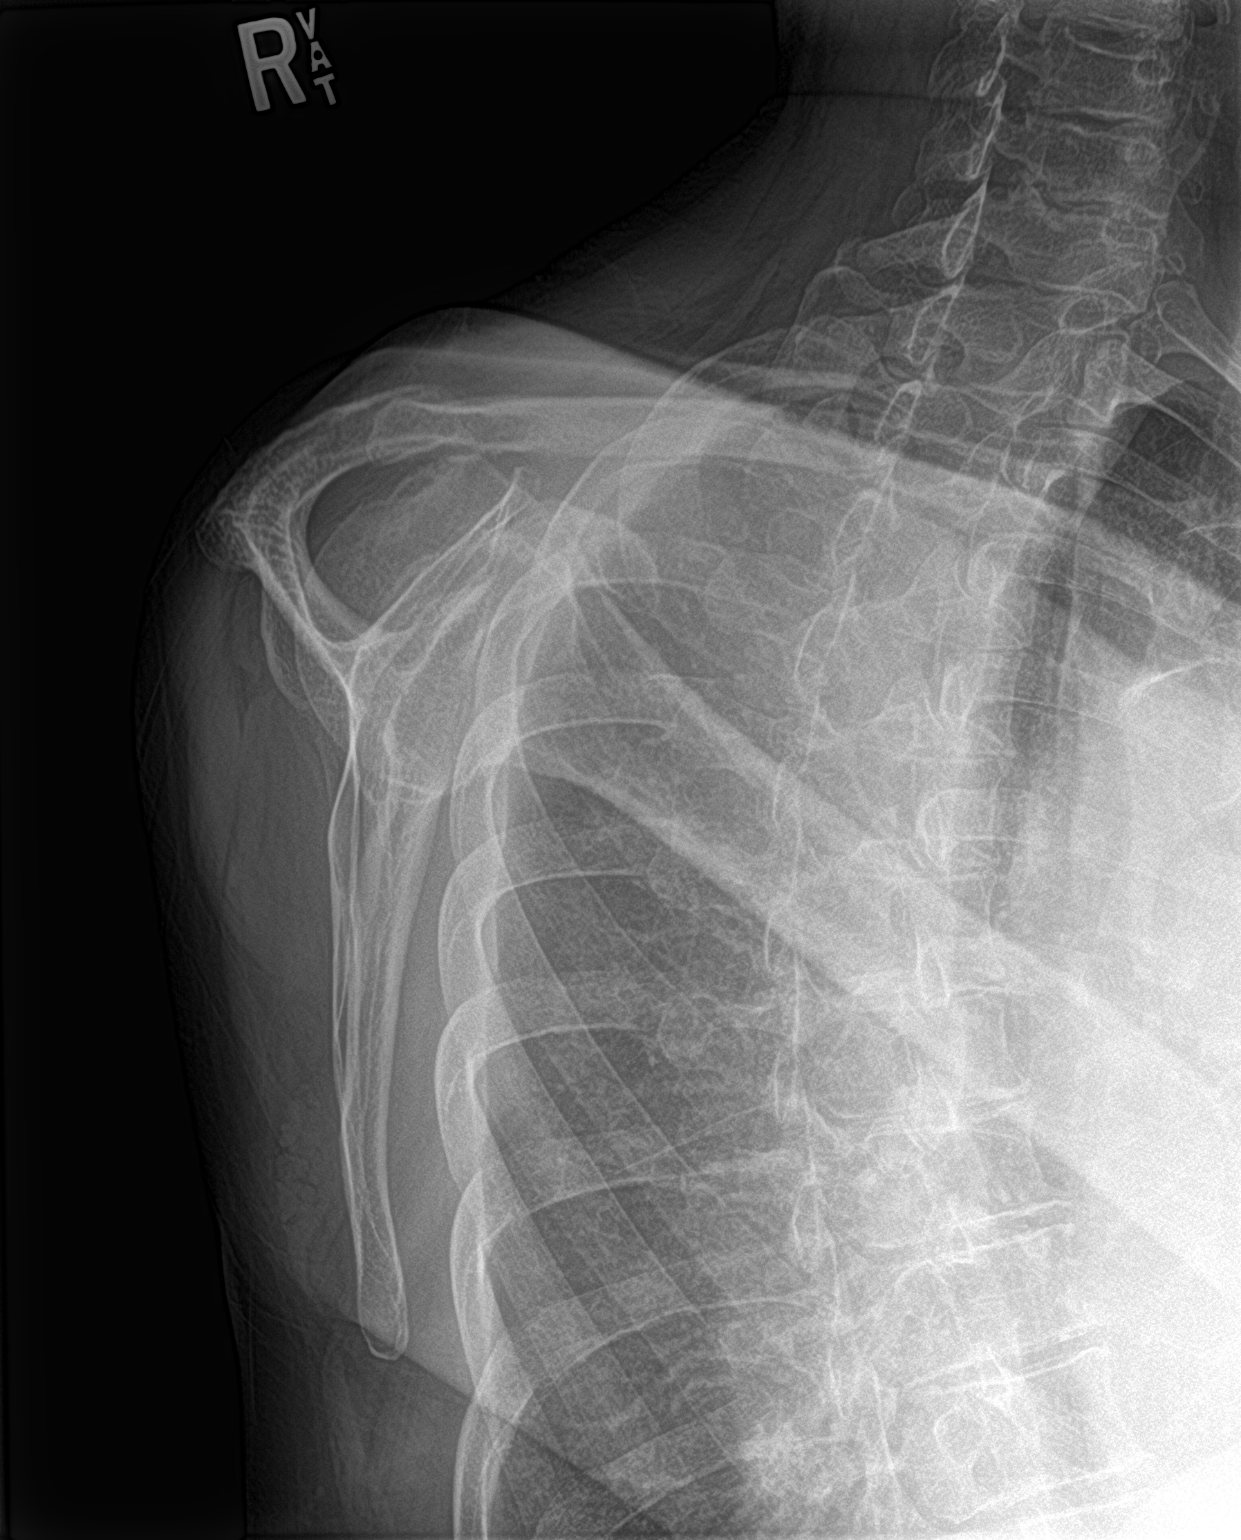

[shoulder axillary]
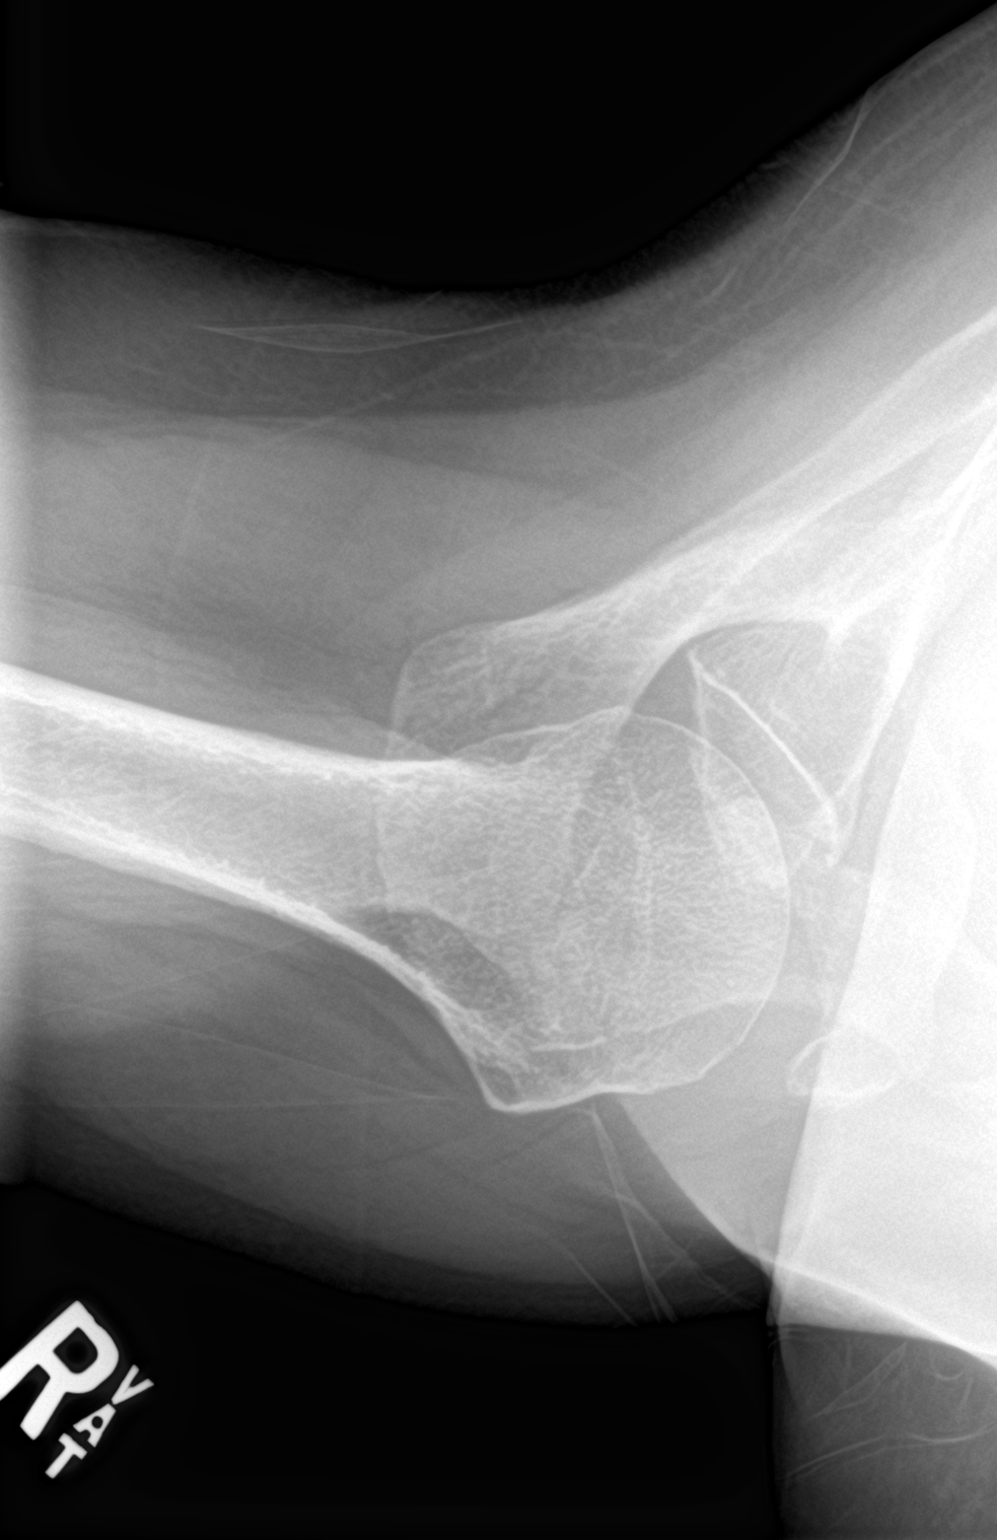

[3 of 3 positions shown; findings below may reference images not displayed]

FINDINGS: There is no evidence of fracture or dislocation. There is no
evidence of arthropathy or other focal bone abnormality. Soft
tissues are unremarkable.
IMPRESSION: Negative.

## 2021-03-03 ENCOUNTER — Telehealth: Payer: Self-pay | Admitting: Cardiovascular Disease

## 2021-03-03 DIAGNOSIS — E7800 Pure hypercholesterolemia, unspecified: Secondary | ICD-10-CM

## 2021-03-03 NOTE — Telephone Encounter (Signed)
Patient would like to have a calcium score test done.

## 2021-03-03 NOTE — Telephone Encounter (Signed)
Returned call to patient who states that she was calling to see if she could go ahead and order the calcium score that was discussed after previous OV 6/22 with Dr. Gwenlyn Found due to elevated cholesterol. Advised patient I would forward message to Dr. Gwenlyn Found to ensure its okay to go ahead and order. Advised patient we would let her know. Patient verbalized understanding.

## 2021-03-04 NOTE — Telephone Encounter (Signed)
Order has been placed for Calcium score- okay per Dr. Gwenlyn Found.   Attempted to call patient to make her aware but unable to reach. Left message stating that scan has been ordered and scheduling will reach out to get her scheduled. Left call back # incase of any issues.

## 2021-03-09 ENCOUNTER — Telehealth: Payer: Self-pay | Admitting: Cardiovascular Disease

## 2021-03-09 DIAGNOSIS — G459 Transient cerebral ischemic attack, unspecified: Secondary | ICD-10-CM

## 2021-03-09 NOTE — Telephone Encounter (Signed)
Patient states she was recently admitted with a TIA dx and she would like to discuss this with Dr. Kennon Holter nurse. Please return call to discuss when able.

## 2021-03-09 NOTE — Telephone Encounter (Signed)
Called pt, no answer.  Left message to return call.

## 2021-03-17 ENCOUNTER — Other Ambulatory Visit: Payer: Self-pay | Admitting: Cardiovascular Disease

## 2021-03-17 ENCOUNTER — Encounter: Payer: Self-pay | Admitting: *Deleted

## 2021-03-17 ENCOUNTER — Ambulatory Visit (INDEPENDENT_AMBULATORY_CARE_PROVIDER_SITE_OTHER): Payer: Medicare Other

## 2021-03-17 DIAGNOSIS — G459 Transient cerebral ischemic attack, unspecified: Secondary | ICD-10-CM

## 2021-03-17 DIAGNOSIS — I4891 Unspecified atrial fibrillation: Secondary | ICD-10-CM

## 2021-03-17 NOTE — Addendum Note (Signed)
Addended by: Cristopher Estimable on: 03/17/2021 04:25 PM   Modules accepted: Orders

## 2021-03-17 NOTE — Telephone Encounter (Signed)
Spoke with pt, she developed left sided neck pain and numbness in the left side of her face 2 weekends ago. She went to [facility] and the echo shows a small PFO with tiny left to right shunt. She was diagnosed with possible TIA. They wanted her to see a cardiologist but she prefers to see dr berry. She currently is having no problems. She has a calcium scoring CT scan on 04/23/21. Follow up scheduled with dr berry 03/25/21 as they told her she would probably need to wear a monitor. Aware will forward this to dr berry to make him aware and see if there are any recommendations prior to the follow up appointment.

## 2021-03-17 NOTE — Telephone Encounter (Signed)
Spoke with pt, aware of dr berry's recommendations.  Order placed for monitor.  Follow up rescheduled for after monitor complete.

## 2021-03-17 NOTE — Progress Notes (Unsigned)
Enrolled for [employer] to mail a ZIO XT long term holter monitor to the patients address on file.  

## 2021-03-21 DIAGNOSIS — G459 Transient cerebral ischemic attack, unspecified: Secondary | ICD-10-CM | POA: Diagnosis not present

## 2021-03-21 DIAGNOSIS — I4891 Unspecified atrial fibrillation: Secondary | ICD-10-CM

## 2021-03-25 ENCOUNTER — Ambulatory Visit: Payer: Medicare Other | Admitting: Cardiovascular Disease

## 2021-03-31 ENCOUNTER — Telehealth: Payer: Self-pay | Admitting: Cardiovascular Disease

## 2021-03-31 NOTE — Telephone Encounter (Signed)
Spoke to patient advised Dr.Berry's RN is in clinic today.She stated her monitor fell off this morning.She has wore for 10 days.She is suppose to wear for 14 days.She wanted to know if ok to mail back.I will send message to her for advice. ?She also wanted to let her know she went to [facility] ED yesterday.She woke up yesterday very weak and felt faint.Echo,CXR, lab work done.All test normal.Stated she thinks Effexor she started taking 5 days ago caused weakness.She stopped taking and feels better. ?I will send message to Dr.Berry's RN to make her aware and ask if ok to mail back monitor. ?

## 2021-03-31 NOTE — Telephone Encounter (Signed)
Spoke to patient Dr.Berry advised ok to mail monitor back. ?

## 2021-03-31 NOTE — Telephone Encounter (Signed)
Patient is requesting to speak with Dr. Kennon Holter nurse. She states she is on the 10th day of wearing her heart monitor, but it came off. She would like to know how to proceed. ?

## 2021-04-06 ENCOUNTER — Telehealth: Payer: Self-pay | Admitting: Cardiovascular Disease

## 2021-04-06 NOTE — Telephone Encounter (Signed)
Follow Up: ? ? ? ? ?Patient wants t o know if her Monitor resu.lts are ready? She says she need this, so they can proceed with her Colonoscopy. ?

## 2021-04-06 NOTE — Telephone Encounter (Signed)
Returned call to patient, who states that she was calling in regard to her monitor results. Patient states that she needs the monitor results in order to get clearance for her colonoscopy. Advised patient to have GI to send [place] a clearance form and patient verbalized understanding. Also advised patient that results were not in yet for her heart monitor but that I would forward message to Dr. Kennon Holter nurse so that when results come in someone can call her to go over them. Patient verbalized understanding.  ?

## 2021-04-13 NOTE — Telephone Encounter (Addendum)
Results sent to PCP and GI. ?

## 2021-04-14 ENCOUNTER — Ambulatory Visit: Payer: Medicare Other | Admitting: Cardiovascular Disease

## 2021-04-14 ENCOUNTER — Other Ambulatory Visit: Payer: Self-pay

## 2021-04-14 ENCOUNTER — Encounter: Payer: Self-pay | Admitting: Cardiovascular Disease

## 2021-04-14 DIAGNOSIS — R002 Palpitations: Secondary | ICD-10-CM | POA: Diagnosis not present

## 2021-04-14 DIAGNOSIS — G459 Transient cerebral ischemic attack, unspecified: Secondary | ICD-10-CM | POA: Diagnosis not present

## 2021-04-14 DIAGNOSIS — E7800 Pure hypercholesterolemia, unspecified: Secondary | ICD-10-CM | POA: Diagnosis not present

## 2021-04-14 NOTE — Patient Instructions (Signed)
Medication Instructions:  ?Your physician recommends that you continue on your current medications as directed. Please refer to the Current Medication list given to you today. ? ?*If you need a refill on your cardiac medications before your next appointment, please call your pharmacy* ? ? ?Lab Work: ?Your physician recommends that you return for lab work in: within 7 days of TEE: BMET and CBC ? ?If you have labs (blood work) drawn today and your tests are completely normal, you will receive your results only by: ?MyChart Message (if you have MyChart) OR ?A paper copy in the mail ?If you have any lab test that is abnormal or we need to change your treatment, we will call you to review the results. ? ? ?Testing/Procedures: ?See below ? ? ?Follow-Up: ?At [facility], you and your health needs are our priority.  As part of our continuing mission to provide you with exceptional heart care, we have created designated Provider Care Teams.  These Care Teams include your primary Cardiologist (physician) and Advanced Practice Providers (APPs -  Physician Assistants and Nurse Practitioners) who all work together to provide you with the care you need, when you need it. ? ?We recommend signing up for the patient portal called "MyChart".  Sign up information is provided on this After Visit Summary.  MyChart is used to connect with patients for Virtual Visits (Telemedicine).  Patients are able to view lab/test results, encounter notes, upcoming appointments, etc.  Non-urgent messages can be sent to your provider as well.   ?To learn more about what you can do with MyChart, go to [email or web address].   ? ?Your next appointment:   ?3 month(s) ? ?The format for your next appointment:   ?In Person ? ?Provider:   ?Quay Burow, MD ? ? ? ?Other Instructions ? ?You are scheduled for a TEE on Wednesday, April 5th  with Dr. Harl Bowie.  Please arrive at the [facility] (Main Entrance A) at [facility]: [address]  [place], [place] [address] at 10:30 am. (1 hour prior to procedure unless lab work is needed; if lab work is needed arrive 1.5 hours ahead) ? ?DIET: Nothing to eat or drink after midnight except a sip of water with medications (see medication instructions below) ? ?FYI: For your safety, and to allow [place] to monitor your vital signs accurately during the surgery/procedure we request that   ?if you have artificial nails, gel coating, SNS etc. Please have those removed prior to your surgery/procedure. Not having the nail coverings /polish removed may result in cancellation or delay of your surgery/procedure. ? ? ?Labs: to be done no more than 7 days prior to procedure. ? ?You must have a responsible person to drive you home and stay in the waiting area during your procedure. Failure to do so could result in cancellation. ? ?Interior and spatial designer cards. ? ?*Special Note: Every effort is made to have your procedure done on time. Occasionally there are emergencies that occur at the hospital that may cause delays. Please be patient if a delay does occur.   ? ?

## 2021-04-14 NOTE — Assessment & Plan Note (Signed)
History of palpitations in the past with recent event monitor showed performed 03/17/2021 showing occasional PACs, PVCs and short runs of nonsustained VT and SVT.  These are markedly improved since I originally saw her back 2 years ago.  Importantly, there was no PAF noted. ?

## 2021-04-14 NOTE — Progress Notes (Signed)
? ? ? ?04/14/2021 ?Melissa [name]   ?01-21-1951  ?[id number] ? ?Primary Physician Melissa [name]., PA-C ?Primary Cardiologist: Lorretta Harp MD Melissa [name], [place] ? ?HPI:  Melissa [name] is a 71 y.o.  moderately overweight widowed Caucasian female (husband of 62 years died of cancer in 04-13-2016), mother of 1, grandmother of 2 grandchildren referred by her PCP ([name]) because of palpitations.  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  She is accompanied by her fianc?s check today.   She also had breast cancer and is now 3 years in remission.  I last saw her in the office 06/06/2020. She has seen Dr. Saunders Revel in the past who performed routine GXT 04/27/17 that was entirely normal for atypical chest pain.  Risk factors include hyperlipidemia intolerant to statin therapy.  She is never had a heart attack or stroke.  There is no family history for heart disease.  She was diagnosed with breast cancer 04/13/17 and had radiation therapy followed by Dr. Jana [name].  She does admit to having panic attacks and anxiety which has been more exacerbated since the loss of her husband.  She saw her PCP on 01/16/2019 which time an EKG showed sinus rhythm with PACs.  Thyroid function tests were normal.  She does not drink alcohol or caffeine. ?  ?She had an event monitor that showed PVCs with short runs of SVT and a 2D echo that was essentially normal. She does admit to anxiety and stress and has since made a "special friend" and is admittedly happier and asymptomatic.  ?  ?She is now 4 years cancer free from having had breast cancer.   ? ?Since I saw her a year ago she was seen in the [facility], for TIA type symptoms on 03/06/2021.  A complete work-up including MRI/MRI were unrevealing.  A 2D echo was essentially normal except for question of left to right shunt through PFO but this was not definitive.  Her symptoms resolved.  She was seen again in the ER on 03/30/2021 with  dizziness which quickly passed without any documented abnormalities in the emergency room. ? ?She did have an event monitor performed in our office 03/17/2021 that showed occasional PACs, PVCs and short runs of nonsustained ventricular tachycardia and SVT but importantly no PAF.  She was begun on rosuvastatin which she did not tolerate. ? ?Current Meds  ?Medication Sig  ? acetaminophen (TYLENOL) 500 MG tablet Take 1,000 mg by mouth every 6 (six) hours as needed for mild pain.  ? ALPRAZolam (XANAX) 0.25 MG tablet 3 (three) times daily. TAKE 1/2 TABLET IN THE MORNING AND 1/2 IN THE EVENING AT BEDTIME  ? anastrozole (ARIMIDEX) 1 MG tablet Take 1 tablet (1 mg total) by mouth daily.  ? cholecalciferol 2000 units tablet Take 2,000 Units by mouth daily.   ? hydrocortisone cream 1 % Apply 1 application topically as needed for itching.  ? hyoscyamine (ANASPAZ) 0.125 MG TBDP disintergrating tablet Take 0.125 mg by mouth every 6 (six) hours as needed.  ? venlafaxine XR (EFFEXOR-XR) 37.5 MG 24 hr capsule Take 1 capsule (37.5 mg total) by mouth daily with breakfast.  ?  ? ?Allergies  ?Allergen Reactions  ? Clindamycin Diarrhea and Nausea Only  ? ? ?Social History  ? ?Socioeconomic History  ? Marital status: Widowed  ?  Spouse name: Not on file  ? Number of children: Not on file  ? Years of education: Not on file  ?  Highest education level: Not on file  ?Occupational History  ? Not on file  ?Tobacco Use  ? Smoking status: Former  ?  Packs/day: 20.00  ?  Years: 1.00  ?  Pack years: 20.00  ?  Types: Cigarettes  ?  Quit date: 3  ?  Years since quitting: 38.2  ? Smokeless tobacco: Never  ?Substance and Sexual Activity  ? Alcohol use: Yes  ?  Comment: social  ? Drug use: Never  ? Sexual activity: Not Currently  ?Other Topics Concern  ? Not on file  ?Social History Narrative  ? 09-13-17 Unable to ask abuse questions daughter with her today.  ? ?Social Determinants of Health  ? ?Financial Resource Strain: Not on file  ?Food  Insecurity: Not on file  ?Transportation Needs: Not on file  ?Physical Activity: Not on file  ?Stress: Not on file  ?Social Connections: Not on file  ?Intimate Partner Violence: Not on file  ?  ? ?Review of Systems: ?General: negative for chills, fever, night sweats or weight changes.  ?Cardiovascular: negative for chest pain, dyspnea on exertion, edema, orthopnea, palpitations, paroxysmal nocturnal dyspnea or shortness of breath ?Dermatological: negative for rash ?Respiratory: negative for cough or wheezing ?Urologic: negative for hematuria ?Abdominal: negative for nausea, vomiting, diarrhea, bright red blood per rectum, melena, or hematemesis ?Neurologic: negative for visual changes, syncope, or dizziness ?All other systems reviewed and are otherwise negative except as noted above. ? ? ? ?Blood pressure (!) 144/78, pulse 80, height '5\' 3"'$  (1.6 m), weight 155 lb 3.2 oz (70.4 kg), SpO2 99 %.  ?General appearance: alert and no distress ?Neck: no adenopathy, no carotid bruit, no JVD, supple, symmetrical, trachea midline, and thyroid not enlarged, symmetric, no tenderness/mass/nodules ?Lungs: clear to auscultation bilaterally ?Heart: regular rate and rhythm, S1, S2 normal, no murmur, click, rub or gallop ?Extremities: extremities normal, atraumatic, no cyanosis or edema ?Pulses: 2+ and symmetric ?Skin: Skin color, texture, turgor normal. No rashes or lesions ?Neurologic: Grossly normal ? ?EKG sinus rhythm at 80 without ST or T wave changes.  Personally reviewed this EKG. ? ?ASSESSMENT AND PLAN:  ? ?Palpitations ?History of palpitations in the past with recent event monitor showed performed 03/17/2021 showing occasional PACs, PVCs and short runs of nonsustained VT and SVT.  These are markedly improved since I originally saw her back 2 years ago.  Importantly, there was no PAF noted. ? ?Pure hypercholesterolemia ?History of hyperlipidemia intolerant to statin therapy with lipid profile performed 06/06/2020 revealing total  cholesterol 255, LDL 178 and HDL 64.  She does have a coronary calcium score scheduled in the near future. ? ?TIA (transient ischemic attack) ?Patient was seen in [place], [facility] of [facility], on 03/06/2021 with TIA type symptoms.  She did have a transthoracic echo that was essentially normal except for the possibility of a tiny left to right shunt across the patent foramen ovale.  I am going to order a TEE to further evaluate.  Recent event monitor did not show any PAF. ? ? ? ? ?Lorretta Harp MD FACP,FACC,FAHA, FSCAI ?04/14/2021 ?9:40 AM ?

## 2021-04-14 NOTE — Assessment & Plan Note (Signed)
Patient was seen in [place], [facility] of [facility], on 03/06/2021 with TIA type symptoms.  She did have a transthoracic echo that was essentially normal except for the possibility of a tiny left to right shunt across the patent foramen ovale.  I am going to order a TEE to further evaluate.  Recent event monitor did not show any PAF. ?

## 2021-04-14 NOTE — Assessment & Plan Note (Signed)
History of hyperlipidemia intolerant to statin therapy with lipid profile performed 06/06/2020 revealing total cholesterol 255, LDL 178 and HDL 64.  She does have a coronary calcium score scheduled in the near future. ?

## 2021-04-15 ENCOUNTER — Other Ambulatory Visit: Payer: Self-pay

## 2021-04-15 ENCOUNTER — Telehealth: Payer: Self-pay | Admitting: Cardiovascular Disease

## 2021-04-15 DIAGNOSIS — G459 Transient cerebral ischemic attack, unspecified: Secondary | ICD-10-CM

## 2021-04-15 NOTE — Telephone Encounter (Signed)
Spoke with pt regarding request for monitor per Dr. Earlean Shawl. Results of cardiac monitor re-sent with attention to [facility]. Instructed pt they have any issues to call us back. Pt verbalizes understanding.  ?

## 2021-04-15 NOTE — Telephone Encounter (Signed)
? ?  Pt is requesting to speak with Dr. Kennon Holter nurse. She said she has question about her heart monitor and if Dr. Gwenlyn Found will cleared her for colonoscopy, she said her GI doctor send request a week ago and the Dr. Gwenlyn Found should have already receive that ?

## 2021-04-21 ENCOUNTER — Encounter (HOSPITAL_COMMUNITY): Payer: Self-pay | Admitting: Internal Medicine

## 2021-04-23 ENCOUNTER — Ambulatory Visit (INDEPENDENT_AMBULATORY_CARE_PROVIDER_SITE_OTHER)
Admission: RE | Admit: 2021-04-23 | Discharge: 2021-04-23 | Disposition: A | Discharge status: Home / Self Care | Payer: Self-pay | Source: Ambulatory Visit | Attending: Cardiovascular Disease | Admitting: Cardiovascular Disease

## 2021-04-23 ENCOUNTER — Telehealth: Payer: Self-pay | Admitting: Cardiovascular Disease

## 2021-04-23 DIAGNOSIS — E7800 Pure hypercholesterolemia, unspecified: Secondary | ICD-10-CM

## 2021-04-23 DIAGNOSIS — G459 Transient cerebral ischemic attack, unspecified: Secondary | ICD-10-CM

## 2021-04-23 NOTE — Telephone Encounter (Signed)
Contacted patient, she states that she would like to cancel upcoming TEE to get rescheduled to sometime in May if possible. She states that she only has one kidney and it concerns her to have to fast until the scheduled procedure. We did discuss that they are aware of this, but patient states she does not want to do it right now, and she is to have a colonoscopy at the end of the month and she needs to do this first. I advised before rescheduling I would have to notify Dr.Berry to make sure this was okay- advised that his nurse would be in contact.  ?Patient verbalized understanding.  ? ?I contacted ENDO scheduling and and cancelled TEE for 04/05. ? ? ?

## 2021-04-23 NOTE — Telephone Encounter (Signed)
Patient rescheduled.  ?Mychart message sent with information.  ?Blood work reordered- needed 1 week prior.  ?Thanks! ?

## 2021-04-23 NOTE — Telephone Encounter (Signed)
Sent mychart message on date or day in May that would work before scheduling.  ? ?

## 2021-04-23 NOTE — Telephone Encounter (Signed)
New message: ? ? ? ?Patient would like to talk to you about the TEE she have scheduled.  ?

## 2021-04-29 ENCOUNTER — Ambulatory Visit (HOSPITAL_COMMUNITY): Admission: RE | Admit: 2021-04-29 | Payer: Medicare Other | Source: Home / Self Care | Admitting: Internal Medicine

## 2021-04-29 SURGERY — ECHOCARDIOGRAM, TRANSESOPHAGEAL
Anesthesia: Monitor Anesthesia Care

## 2021-06-03 ENCOUNTER — Encounter (HOSPITAL_BASED_OUTPATIENT_CLINIC_OR_DEPARTMENT_OTHER): Payer: Self-pay | Admitting: Internal Medicine

## 2021-06-03 ENCOUNTER — Ambulatory Visit (HOSPITAL_BASED_OUTPATIENT_CLINIC_OR_DEPARTMENT_OTHER): Payer: Medicare Other | Admitting: Internal Medicine

## 2021-06-03 VITALS — BP 143/80 | HR 76 | Ht 63.0 in | Wt 157.4 lb

## 2021-06-03 DIAGNOSIS — E785 Hyperlipidemia, unspecified: Secondary | ICD-10-CM

## 2021-06-03 DIAGNOSIS — G459 Transient cerebral ischemic attack, unspecified: Secondary | ICD-10-CM | POA: Diagnosis not present

## 2021-06-03 MED ORDER — ATORVASTATIN CALCIUM 40 MG PO TABS: 40.0000 mg | ORAL_TABLET | Freq: Every day | ORAL | 3 refills | Status: DC

## 2021-06-03 NOTE — Progress Notes (Signed)
? ? ?LIPID CLINIC CONSULT NOTE ? ?Chief Complaint:  ?Manage dyslipidemia ? ?Primary Care Physician: ?Aletha Halim., PA-C ? ?Primary Cardiologist:  ?Nelva Bush, MD ? ?HPI:  ?Melissa [name] is a 71 y.o. female who is being seen today for the evaluation of dyslipidemia at the request of Lorretta Harp, MD. this is a pleasant 71 year old female kindly referred for evaluation management of dyslipidemia.  She reports a longstanding cholesterol disorder but has been intolerant to statins.  In the distant past she was placed on rosuvastatin but could not tolerate it and recently restarted it but had issues with myalgias.  She reports she has not tried any other statins.  Other medical problems include history of pulmonary embolism remotely and then more recently she had possible TIA.  She is scheduled for a TEE to evaluate a possible PFO.  She was noted however to have high cholesterol with recent total cholesterol of 251, triglycerides 101, HDL 61 and LDL 170.  Given her history of TIA, her target LDL cholesterol is less than 70.  She does report some intake of saturated fats.  In addition she reports eating 2 eggs daily.  She did have a recent coronary calcium score which was 0 and is somewhat reassuring. ? ?PMHx:  ?Past Medical History:  ?Diagnosis Date  ? Anxiety   ? Arthritis   ? shoulders, hands  ? Cancer ([place]) 07/2017  ? right breast cancer  ? Chest pain   ? Diverticulosis   ? GERD (gastroesophageal reflux disease)   ? High cholesterol   ? IBS (irritable bowel syndrome)   ? Kidney disorder   ? Pt states only has left kidney  ? Panic attacks   ? Pulmonary embolism ([place]) 2018  ? pt states resolved and no longer takes blood thinners  ? ? ?Past Surgical History:  ?Procedure Laterality Date  ? BREAST LUMPECTOMY WITH RADIOACTIVE SEED AND SENTINEL LYMPH NODE BIOPSY Right 08/23/2017  ? Procedure: BREAST LUMPECTOMY WITH RADIOACTIVE SEED AND SENTINEL LYMPH NODE BIOPSY;  Surgeon: Erroll Luna, MD;  Location:  [place];  Service: General;  Laterality: Right;  ? TUBAL LIGATION    ? ? ?FAMHx:  ?Family History  ?Problem Relation Age of Onset  ? Alzheimer's disease Mother   ? Stroke Father 71  ? Heart attack Maternal Grandmother   ? Heart attack Paternal Grandmother   ? Cervical cancer Cousin   ? Colon cancer Paternal Aunt   ? Colon cancer Cousin   ? ? ?SOCHx:  ? reports that she quit smoking about 38 years ago. Her smoking use included cigarettes. She has a 20.00 pack-year smoking history. She has never used smokeless tobacco. She reports current alcohol use. She reports that she does not use drugs. ? ?ALLERGIES:  ?Allergies  ?Allergen Reactions  ? Clindamycin Diarrhea and Nausea Only  ? ? ?ROS: ?Pertinent items noted in HPI and remainder of comprehensive ROS otherwise negative. ? ?HOME MEDS: ?Current Outpatient Medications on File Prior to Visit  ?Medication Sig Dispense Refill  ? acetaminophen (TYLENOL) 500 MG tablet Take 1,000 mg by mouth every 6 (six) hours as needed for mild pain.    ? ALPRAZolam (XANAX) 0.25 MG tablet 3 (three) times daily. TAKE 1/2 TABLET IN THE MORNING AND 1/2 IN THE EVENING AT BEDTIME    ? anastrozole (ARIMIDEX) 1 MG tablet Take 1 tablet (1 mg total) by mouth daily. 90 tablet 4  ? cholecalciferol 2000 units tablet Take 2,000 Units by mouth daily.     ?  hydrocortisone cream 1 % Apply 1 application topically as needed for itching.    ? hyoscyamine (ANASPAZ) 0.125 MG TBDP disintergrating tablet Take 0.125 mg by mouth every 6 (six) hours as needed.  3  ? omeprazole (PRILOSEC) 20 MG capsule Take 20 mg by mouth daily.    ? venlafaxine XR (EFFEXOR-XR) 37.5 MG 24 hr capsule Take 1 capsule (37.5 mg total) by mouth daily with breakfast. (Patient not taking: Reported on 06/03/2021) 90 capsule 4  ? ?No current facility-administered medications on file prior to visit.  ? ? ?LABS/IMAGING: ?No results found for this or any previous visit (from the past 48 hour(s)). ?No results found. ? ?LIPID  PANEL: ?   ?Component Value Date/Time  ? CHOL 255 (H) 06/06/2020 1010  ? TRIG 78 06/06/2020 1010  ? HDL 64 06/06/2020 1010  ? CHOLHDL 4.0 06/06/2020 1010  ? CHOLHDL 3.9 03/26/2017 0700  ? VLDL 11 03/26/2017 0700  ? LDLCALC 178 (H) 06/06/2020 1010  ? ? ?WEIGHTS: ?Wt Readings from Last 3 Encounters:  ?06/03/21 157 lb 6.4 oz (71.4 kg)  ?04/14/21 155 lb 3.2 oz (70.4 kg)  ?12/11/20 149 lb (67.6 kg)  ? ? ?VITALS: ?BP (!) 143/80   Pulse 76   Ht '5\' 3"'$  (1.6 m)   Wt 157 lb 6.4 oz (71.4 kg)   SpO2 98%   BMI 27.88 kg/m?  ? ?EXAM: ?Deferred ? ?EKG: ?Deferred ? ?ASSESSMENT: ?Mixed dyslipidemia, goal LDL less than 70 ?History of possible TIA ?History of PE ?0 CAC score (03/2021) ? ?PLAN: ?1.   Ms. Jutte has a mixed dyslipidemia with a target LDL less than 70.  She is only really tried rosuvastatin in the past which caused her myalgias.  I think we need to try another high intensity statin and would recommend atorvastatin 40 mg nightly.  If she has myalgias with this, will have to consider PCSK9 inhibitor.  Would like to get a lipid NMR as well as LP(a) given her history of PE and possible TIA, LP(a) may be high as it is atherogenic and could be a risk factor for stroke.  The fact that she has no coronary calcium is somewhat reassuring. ? ?Plan follow-up with me in 3 to 4 months.  Thanks again for the kind referral. ? ?Pixie Casino, MD, [facility], FACP  ?[place]  ?Medical Director of the Advanced Lipid Disorders &  ?Cardiovascular Risk Reduction Clinic ?Diplomate of the [employer] of Clinical Lipidology ?Attending Cardiologist  ?Direct Dial: [phone]  Fax: [phone]  ?Website:  [email or web address] ? ?Nadean Corwin Tamilyn Lupien ?06/03/2021, 8:58 AM  ?

## 2021-06-03 NOTE — Patient Instructions (Signed)
Medication Instructions:  ?START atorvastatin '40mg'$  daily at nighttime ? ?*If you need a refill on your cardiac medications before your next appointment, please call your pharmacy* ? ? ?Lab Work: ?FASTING lab work to check cholesterol in 3-4 months  ? ?If you have labs (blood work) drawn today and your tests are completely normal, you will receive your results only by: ?MyChart Message (if you have MyChart) OR ?A paper copy in the mail ?If you have any lab test that is abnormal or we need to change your treatment, we will call you to review the results. ? ? ?Testing/Procedures: ?NONE ? ? ?Follow-Up: ?At [facility], you and your health needs are our priority.  As part of our continuing mission to provide you with exceptional heart care, we have created designated Provider Care Teams.  These Care Teams include your primary Cardiologist (physician) and Advanced Practice Providers (APPs -  Physician Assistants and Nurse Practitioners) who all work together to provide you with the care you need, when you need it. ? ?We recommend signing up for the patient portal called "MyChart".  Sign up information is provided on this After Visit Summary.  MyChart is used to connect with patients for Virtual Visits (Telemedicine).  Patients are able to view lab/test results, encounter notes, upcoming appointments, etc.  Non-urgent messages can be sent to your provider as well.   ?To learn more about what you can do with MyChart, go to [email or web address].   ? ?Your next appointment:   ? ?3-4 months with Dr. Debara Pickett -- lipid clinic ? ?

## 2021-06-15 ENCOUNTER — Encounter (HOSPITAL_COMMUNITY): Payer: Self-pay | Admitting: Cardiovascular Disease

## 2021-06-15 NOTE — Progress Notes (Signed)
Attempted to obtain medical history via telephone, unable to reach at this time. HIPAA compliant voicemail message left requesting return call to pre surgical testing department. 

## 2021-06-16 ENCOUNTER — Telehealth: Payer: Self-pay | Admitting: Internal Medicine

## 2021-06-16 NOTE — Telephone Encounter (Signed)
Pt c/o medication issue:  1. Name of Medication: atorvastatin (LIPITOR) 40 MG tablet  2. How are you currently taking this medication (dosage and times per day)? Take 1 tablet (40 mg total) by mouth daily.  3. Are you having a reaction (difficulty breathing--STAT)?   4. What is your medication issue? After taking it for 5 days, she had stomach upset, pain, and frequent urination.  Has been dealing this for a few days. Is no longer taking it.

## 2021-06-17 NOTE — Telephone Encounter (Signed)
Ok .. advise her to staff of the atorvastatin - will look into alternatives.  Dr Lemmie Evens

## 2021-06-18 NOTE — Telephone Encounter (Signed)
Pt informed of providers recommendations. Pt verbalized understanding. She has already stopped. No further questions .

## 2021-06-24 ENCOUNTER — Encounter (HOSPITAL_COMMUNITY): Admission: RE | Payer: Self-pay | Source: Home / Self Care

## 2021-06-24 ENCOUNTER — Ambulatory Visit (HOSPITAL_COMMUNITY): Admission: RE | Admit: 2021-06-24 | Payer: Medicare Other | Source: Home / Self Care | Admitting: Cardiovascular Disease

## 2021-06-24 SURGERY — ECHOCARDIOGRAM, TRANSESOPHAGEAL
Anesthesia: Monitor Anesthesia Care

## 2021-07-03 ENCOUNTER — Telehealth: Payer: Self-pay | Admitting: Internal Medicine

## 2021-07-03 NOTE — Telephone Encounter (Signed)
Patient stated she has been off atorvastatin since Jun 16, 2021. She no longer has GI issues or body aches. She had blood work done yesterday 6/8 (results in [place]). She would rather not be on a statin and she wants to preserve her remaining kidney. Please advise.

## 2021-07-03 NOTE — Telephone Encounter (Signed)
Pt c/o medication issue:  1. Name of Medication: atorvastatin (LIPITOR) 40 MG tablet  2. How are you currently taking this medication (dosage and times per day)? Take 1 tablet (40 mg total) by mouth daily.  3. Are you having a reaction (difficulty breathing--STAT)? no  4. What is your medication issue? Patient stomach was upset all the time, diarrhea, body aching all over and felt bad. Had blood work done on yesterday, it showed her cholesterol being high. dr sent blood work over. Please advise

## 2021-07-06 NOTE — Telephone Encounter (Signed)
Has lipid clinic appointment with Dr Debara Pickett in September.  Direct LDL 178.  Will route to Dr Debara Pickett and Eliezer Lofts.

## 2021-07-07 MED ORDER — REPATHA SURECLICK 140 MG/ML ~~LOC~~ SOAJ: 1.0000 | SUBCUTANEOUS | 3 refills | Status: DC

## 2021-07-07 NOTE — Telephone Encounter (Signed)
Melissa Casino, MD  Fidel Levy, RN Melissa [name]: Unspecified (4 days ago,  8:44 AM) She was supposed to have NMR and LP(a) - cannot take statin. LDL not over 190, but would prefer to try and get her on PCSK9i.  She has a history of TIA - so target LDL <70. I would go that route.   Dr Lemmie Evens

## 2021-07-07 NOTE — Telephone Encounter (Signed)
Spoke with patient and relayed MD advice on labs and med changes She agreed w/plan Informed her will seek PA from insurance Scheduled her for appt with [facility]-pharmacist for first injection, med education on 07/15/21 Advised will let her know when med is approved Pharmacy: [facility]  PA submitted in [facility] (Key: [place])

## 2021-07-07 NOTE — Telephone Encounter (Signed)
Request Reference Number: [id number]. REPATHA SURE INJ '140MG'$ /ML is approved through 01/06/2022  Patient aware med approved Rx(s) sent to pharmacy electronically.

## 2021-07-15 ENCOUNTER — Telehealth: Payer: Self-pay | Admitting: Internal Medicine

## 2021-07-15 ENCOUNTER — Ambulatory Visit: Payer: Medicare Other

## 2021-07-15 ENCOUNTER — Encounter: Payer: Self-pay | Admitting: Pharmacist Clinician (PhC)/ Clinical Pharmacy Specialist

## 2021-07-15 ENCOUNTER — Ambulatory Visit: Payer: Medicare Other | Admitting: Pharmacist Clinician (PhC)/ Clinical Pharmacy Specialist

## 2021-07-15 ENCOUNTER — Ambulatory Visit: Payer: Medicare Other | Admitting: Cardiovascular Disease

## 2021-07-15 ENCOUNTER — Encounter: Payer: Self-pay | Admitting: Cardiovascular Disease

## 2021-07-15 DIAGNOSIS — R0789 Other chest pain: Secondary | ICD-10-CM

## 2021-07-15 DIAGNOSIS — E785 Hyperlipidemia, unspecified: Secondary | ICD-10-CM | POA: Insufficient documentation

## 2021-07-15 DIAGNOSIS — R002 Palpitations: Secondary | ICD-10-CM | POA: Diagnosis not present

## 2021-07-15 DIAGNOSIS — E782 Mixed hyperlipidemia: Secondary | ICD-10-CM | POA: Diagnosis not present

## 2021-07-15 NOTE — Assessment & Plan Note (Signed)
History of palpitations with event monitor that showed occasional PACs, PVCs and rare short runs of NSVT and SVT.  Importantly, there is no PAF documented on the study.

## 2021-07-15 NOTE — Progress Notes (Signed)
07/15/2021 Melissa [name]   March 11, 1950  [id number]  Primary Physician Aletha Halim., PA-C Primary Cardiologist: Lorretta Harp MD Garret Reddish, [place], [place]  HPI:  Melissa [name] is a 71 y.o.  moderately overweight widowed Caucasian female (husband of 70 years died of cancer in April 16, 2016), mother of 1, grandmother of 2 grandchildren referred by her PCP ([facility]) because of palpitations.  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  She is accompanied by her fiancs check today.   She also had breast cancer and is now 3 years in remission.  I last saw her in the office 04/14/2021. She has seen Dr. Saunders Revel in the past who performed routine GXT 04/27/17 that was entirely normal for atypical chest pain.  Risk factors include hyperlipidemia intolerant to statin therapy.  She is never had a heart attack or stroke.  There is no family history for heart disease.  She was diagnosed with breast cancer 04/16/2017 and had radiation therapy followed by Dr. Jana Hakim.  She does admit to having panic attacks and anxiety which has been more exacerbated since the loss of her husband.  She saw her PCP on 01/16/2019 which time an EKG showed sinus rhythm with PACs.  Thyroid function tests were normal.  She does not drink alcohol or caffeine.   She had an event monitor that showed PVCs with short runs of SVT and a 2D echo that was essentially normal. She does admit to anxiety and stress and has since made a "special friend" and is admittedly happier and asymptomatic.    She is now 4 years cancer free from having had breast cancer.     She was seen in the [facility], for TIA type symptoms on 03/06/2021.  A complete work-up including MRI/MRI were unrevealing.  A 2D echo was essentially normal except for question of left to right shunt through PFO but this was not definitive.  Her symptoms resolved.  She was seen again in the ER on 03/30/2021 with dizziness which quickly  passed without any documented abnormalities in the emergency room.  She did have an event monitor performed in our office 03/17/2021 that showed occasional PACs, PVCs and short runs of nonsustained ventricular tachycardia and SVT but importantly no PAF.  She was begun on rosuvastatin which she did not tolerate.  I referred her to Dr. Debara Pickett who tried her on atorvastatin which she likewise did not tolerate.  She was prescribed Repatha which she has yet to start because of concerns with regards to possible side effects.  She denies chest pain or shortness of breath.   Current Meds  Medication Sig   acetaminophen (TYLENOL) 500 MG tablet Take 1,000 mg by mouth every 6 (six) hours as needed for mild pain.   ALPRAZolam (XANAX) 0.25 MG tablet 3 (three) times daily. TAKE 1/2 TABLET IN THE MORNING AND 1/2 IN THE EVENING AT BEDTIME   anastrozole (ARIMIDEX) 1 MG tablet Take 1 tablet (1 mg total) by mouth daily.   cholecalciferol 2000 units tablet Take 2,000 Units by mouth daily.    hydrocortisone cream 1 % Apply 1 application topically as needed for itching.   hyoscyamine (ANASPAZ) 0.125 MG TBDP disintergrating tablet Take 0.125 mg by mouth every 6 (six) hours as needed.   omeprazole (PRILOSEC) 20 MG capsule Take 20 mg by mouth daily.     Allergies  Allergen Reactions   Clindamycin Diarrhea and Nausea Only   Atorvastatin  Myalgias, GI upset   Rosuvastatin     Myalgias, GI upset    Social History   Socioeconomic History   Marital status: Widowed    Spouse name: Not on file   Number of children: Not on file   Years of education: Not on file   Highest education level: Not on file  Occupational History   Not on file  Tobacco Use   Smoking status: Former    Packs/day: 20.00    Years: 1.00    Total pack years: 20.00    Types: Cigarettes    Quit date: 15    Years since quitting: 38.4   Smokeless tobacco: Never  Substance and Sexual Activity   Alcohol use: Yes    Comment: social    Drug use: Never   Sexual activity: Not Currently  Other Topics Concern   Not on file  Social History Narrative   09-13-17 Unable to ask abuse questions daughter with her today.   Social Determinants of Health   Financial Resource Strain: Not on file  Food Insecurity: Not on file  Transportation Needs: Not on file  Physical Activity: Not on file  Stress: Not on file  Social Connections: Not on file  Intimate Partner Violence: Not At Risk (11/23/2017)   Humiliation, Afraid, Rape, and Kick questionnaire    Fear of Current or Ex-Partner: No    Emotionally Abused: No    Physically Abused: No    Sexually Abused: No     Review of Systems: General: negative for chills, fever, night sweats or weight changes.  Cardiovascular: negative for chest pain, dyspnea on exertion, edema, orthopnea, palpitations, paroxysmal nocturnal dyspnea or shortness of breath Dermatological: negative for rash Respiratory: negative for cough or wheezing Urologic: negative for hematuria Abdominal: negative for nausea, vomiting, diarrhea, bright red blood per rectum, melena, or hematemesis Neurologic: negative for visual changes, syncope, or dizziness All other systems reviewed and are otherwise negative except as noted above.    Blood pressure (!) 146/72, pulse 83, height '5\' 3"'$  (1.6 m), weight 158 lb (71.7 kg), SpO2 97 %.  General appearance: alert and no distress Neck: no adenopathy, no carotid bruit, no JVD, supple, symmetrical, trachea midline, and thyroid not enlarged, symmetric, no tenderness/mass/nodules Lungs: clear to auscultation bilaterally Heart: regular rate and rhythm, S1, S2 normal, no murmur, click, rub or gallop Extremities: extremities normal, atraumatic, no cyanosis or edema Pulses: 2+ and symmetric Skin: Skin color, texture, turgor normal. No rashes or lesions Neurologic: Grossly normal  EKG not performed today  ASSESSMENT AND PLAN:   Atypical chest pain Atypical chest pain with a  coronary calcium score of 0  Palpitations History of palpitations with event monitor that showed occasional PACs, PVCs and rare short runs of NSVT and SVT.  Importantly, there is no PAF documented on the study.  Hyperlipidemia History of hyperlipidemia intolerant to statin therapy.  She was hesitant to begin Repatha because of potential side effects.  I am going to have her speak with a Pharm.D. this afternoon to discuss.  Importantly, her coronary calcium score was 0.  She did see Dr. Debara Pickett at my request.     Lorretta Harp MD [facility], [facility] 07/15/2021 10:24 AM

## 2021-07-15 NOTE — Progress Notes (Signed)
Patient in office today for injection training,   She was concerned about being prescribed medication without any information and hesitant to start.  She had previously been on rosuvastatin then atorvastatin, but not able to tolerate either.  When she failed atorvastatin, Dr. Debara Pickett prescribed Repatha.  She picked up the prescription at her pharmacy, but has not taken any doses at this time.  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    Reviewed mechanism of action of Repatha in detail, as well as possible side effects.  Showed injection technique with sample pen and answered all patient questions.   She was agreeable to start medication and took first dose in the office.  Will continue with q14 day dosing and repeat labs after 2-3 months.

## 2021-07-15 NOTE — Assessment & Plan Note (Signed)
History of hyperlipidemia intolerant to statin therapy.  She was hesitant to begin Repatha because of potential side effects.  I am going to have her speak with a Pharm.D. this afternoon to discuss.  Importantly, her coronary calcium score was 0.  She did see Dr. Debara Pickett at my request.

## 2021-07-15 NOTE — Patient Instructions (Signed)
Medication Instructions:  Your physician recommends that you continue on your current medications as directed. Please refer to the Current Medication list given to you today.  *If you need a refill on your cardiac medications before your next appointment, please call your pharmacy*   Follow-Up: At [facility], you and your health needs are our priority.  As part of our continuing mission to provide you with exceptional heart care, we have created designated Provider Care Teams.  These Care Teams include your primary Cardiologist (physician) and Advanced Practice Providers (APPs -  Physician Assistants and Nurse Practitioners) who all work together to provide you with the care you need, when you need it.  We recommend signing up for the patient portal called "MyChart".  Sign up information is provided on this After Visit Summary.  MyChart is used to connect with patients for Virtual Visits (Telemedicine).  Patients are able to view lab/test results, encounter notes, upcoming appointments, etc.  Non-urgent messages can be sent to your provider as well.   To learn more about what you can do with MyChart, go to [email or web address].    Your next appointment:   6 month(s)  The format for your next appointment:   In Person  Provider:   Angela Duke, PA-C, Callie Goodrich, PA-C, Jennifer, Lambert, PA-C, Kathryn Lawrence, DNP, ANP, Hao Meng, PA-C, or Emily Monge, NP       Then, Jonathan Berry, MD will plan to see you again in 12 month(s).  

## 2021-07-15 NOTE — Telephone Encounter (Signed)
Pt c/o medication issue:  1. Name of Medication:   Evolocumab (REPATHA SURECLICK) 419 MG/ML SOAJ    2. How are you currently taking this medication (dosage and times per day)? As written  3. Are you having a reaction (difficulty breathing--STAT)? no  4. What is your medication issue?  Pt cancelled her appt today with pharmacist and would like to speak with a nurse regarding this medication.

## 2021-07-15 NOTE — Telephone Encounter (Signed)
Expresses fears of starting repatha and side effects Says she does have repatha at home but is reluctant to starting it due to potential side effects of high blood pressure Discussed in detail the benefits of taking repatha vs not taking it and risks associated with high cholesterol Gave patient repatha number ([phone]) to contact repatha education nurse for more details Advised message would be sent to PharmD and to contact our office if she decided to start it. Verbalized understanding of plan

## 2021-07-15 NOTE — Assessment & Plan Note (Signed)
Atypical chest pain with a coronary calcium score of 0

## 2021-07-29 ENCOUNTER — Telehealth: Payer: Self-pay | Admitting: Cardiovascular Disease

## 2021-07-29 NOTE — Telephone Encounter (Signed)
Should not be any drug interactions between Bactrim and Repatha

## 2021-07-29 NOTE — Telephone Encounter (Signed)
Patient informed of pharmD reply.Marland KitchenMarland Kitchen"Should not be any drug interactions between Bactrim and Repatha." Patient had no other questions at this time.

## 2021-07-29 NOTE — Telephone Encounter (Signed)
Pt c/o medication issue:  1. Name of Medication: Evolocumab (REPATHA SURECLICK) 592 MG/ML SOAJ  2. How are you currently taking this medication (dosage and times per day)? Inject 1 Dose into the skin every 14 (fourteen) days.Patient not taking: Reported on 07/15/2021  3. Are you having a reaction (difficulty breathing--STAT)? no  4. What is your medication issue? Patient has a UTI and the medication that was prescribe to her. She wants to know if she would wait on take the Repatha until she is finish with medication. Please advis e

## 2021-07-29 NOTE — Telephone Encounter (Signed)
Patient is currently being treat for UTI with Bactrim. She wants to know if it will affect her liver with her other drugs, esp. Repatha. Please advise.

## 2021-08-19 ENCOUNTER — Telehealth: Payer: Self-pay | Admitting: Internal Medicine

## 2021-08-19 NOTE — Telephone Encounter (Signed)
Patient informed of the reply from Dr. Debara Pickett.Marland KitchenMarland Kitchen"Options are limited- cannot take statins. If she cannot tolerate Repatha, then we could look into Leqvio- but may be cost prohibitive.  Otherwise, might consider Nexlizet." Patient stated she may try Repatha again. She wanted the options placed in Mychart so she can look up the medications. Done.

## 2021-08-19 NOTE — Telephone Encounter (Signed)
Pt c/o medication issue:  1. Name of Medication:   Evolocumab (REPATHA SURECLICK) 601 MG/ML SOAJ    2. How are you currently taking this medication (dosage and times per day)? Inject 1 Dose into the skin every 14 (fourteen) days  3. Are you having a reaction (difficulty breathing--STAT)? No  4. What is your medication issue? Pt states that she has been having flu like symptoms as well as upper back and spine pain since taking medication. Pt would like for nurse to callback regarding this matter. Please advise

## 2021-08-19 NOTE — Telephone Encounter (Signed)
Patient reported that after her first 2 doses of Repatha, she had flu-like symptoms and joint pain, esp. In upper back and across shoulders. On 7/19, she saw pcp and had blood work. She did not take her 3rd does of Repatha that day and stated the joint pain and back /shoulder pain has decreased and she is feeling better. Patient wants to know if she should be on another lipid-lowering med.

## 2021-08-19 NOTE — Telephone Encounter (Signed)
Options are limited- cannot take statins. If she cannot tolerate Repatha, then we could look into Leqvio- but may be cost prohibitive.  Otherwise, might consider Nexlizet.  Dr Lemmie Evens

## 2021-08-21 NOTE — Telephone Encounter (Signed)
Melissa [name] - can we pursue a PA for repatha if needed? She has taken this before and wants to try again.  -Mali

## 2021-08-25 NOTE — Telephone Encounter (Signed)
Repatha PA is approved until 01/24/22 MyChart message sent to patient and advised if she has been off for some time/not started then we should move her 9/5 appt out to allow about 3 months on therapy before checking lipids.

## 2021-09-29 ENCOUNTER — Encounter (HOSPITAL_BASED_OUTPATIENT_CLINIC_OR_DEPARTMENT_OTHER): Payer: Self-pay | Admitting: Internal Medicine

## 2021-09-29 ENCOUNTER — Ambulatory Visit (HOSPITAL_BASED_OUTPATIENT_CLINIC_OR_DEPARTMENT_OTHER): Payer: Medicare Other | Admitting: Internal Medicine

## 2021-09-29 VITALS — BP 128/72 | HR 77 | Ht 63.75 in | Wt 162.4 lb

## 2021-09-29 DIAGNOSIS — Z8673 Personal history of transient ischemic attack (TIA), and cerebral infarction without residual deficits: Secondary | ICD-10-CM

## 2021-09-29 DIAGNOSIS — E785 Hyperlipidemia, unspecified: Secondary | ICD-10-CM | POA: Diagnosis not present

## 2021-09-29 NOTE — Progress Notes (Signed)
LIPID CLINIC CONSULT NOTE  Chief Complaint:  Follow-up dyslipidemia  Primary Care Physician: Aletha Halim., PA-C  Primary Cardiologist:  Nelva Bush, MD  HPI:  Melissa [name] is a 71 y.o. female who is being seen today for the evaluation of dyslipidemia at the request of Deatra Ina, Kristen W., PA-C. this is a pleasant 71 year old female kindly referred for evaluation management of dyslipidemia.  She reports a longstanding cholesterol disorder but has been intolerant to statins.  In the distant past she was placed on rosuvastatin but could not tolerate it and recently restarted it but had issues with myalgias.  She reports she has not tried any other statins.  Other medical problems include history of pulmonary embolism remotely and then more recently she had possible TIA.  She is scheduled for a TEE to evaluate a possible PFO.  She was noted however to have high cholesterol with recent total cholesterol of 251, triglycerides 101, HDL 61 and LDL 170.  Given her history of TIA, her target LDL cholesterol is less than 70.  She does report some intake of saturated fats.  In addition she reports eating 2 eggs daily.  She did have a recent coronary calcium score which was 0 and is somewhat reassuring.  09/29/2021  Ms. Hedger returns today for follow-up.  She reports due to some medical issues she had been delayed in starting her Repatha.  She did however recently start the medication after being seen and evaluated by her PCP and treated for a UTI which may have been causing her myalgias.  She has not had repeat lipid assessment.  PMHx:  Past Medical History:  Diagnosis Date   Anxiety    Arthritis    shoulders, hands   Cancer ([place]) 07/2017   right breast cancer   Chest pain    Diverticulosis    GERD (gastroesophageal reflux disease)    High cholesterol    IBS (irritable bowel syndrome)    Kidney disorder    Pt states only has left kidney   Panic attacks    Pulmonary embolism  ([place]) 2018   pt states resolved and no longer takes blood thinners    Past Surgical History:  Procedure Laterality Date   BREAST LUMPECTOMY WITH RADIOACTIVE SEED AND SENTINEL LYMPH NODE BIOPSY Right 08/23/2017   Procedure: BREAST LUMPECTOMY WITH RADIOACTIVE SEED AND SENTINEL LYMPH NODE BIOPSY;  Surgeon: Erroll Luna, MD;  Location: [facility];  Service: General;  Laterality: Right;   TUBAL LIGATION      FAMHx:  Family History  Problem Relation Age of Onset   Alzheimer's disease Mother    Stroke Father 81   Heart attack Maternal Grandmother    Heart attack Paternal Grandmother    Cervical cancer Cousin    Colon cancer Paternal Aunt    Colon cancer Cousin     SOCHx:   reports that she quit smoking about 38 years ago. Her smoking use included cigarettes. She has a 20.00 pack-year smoking history. She has never used smokeless tobacco. She reports current alcohol use. She reports that she does not use drugs.  ALLERGIES:  Allergies  Allergen Reactions   Clindamycin Diarrhea and Nausea Only   Atorvastatin     Myalgias, GI upset   Rosuvastatin     Myalgias, GI upset    ROS: Pertinent items noted in HPI and remainder of comprehensive ROS otherwise negative.  HOME MEDS: Current Outpatient Medications on File Prior to Visit  Medication Sig Dispense Refill  acetaminophen (TYLENOL) 500 MG tablet Take 1,000 mg by mouth every 6 (six) hours as needed for mild pain.     ALPRAZolam (XANAX) 0.25 MG tablet 3 (three) times daily. TAKE 1/2 TABLET IN THE MORNING AND 1/2 IN THE EVENING AT BEDTIME     anastrozole (ARIMIDEX) 1 MG tablet Take 1 tablet (1 mg total) by mouth daily. 90 tablet 4   cholecalciferol 2000 units tablet Take 2,000 Units by mouth daily.      Evolocumab (REPATHA SURECLICK) 474 MG/ML SOAJ Inject 1 Dose into the skin every 14 (fourteen) days. 6 mL 3   hydrocortisone cream 1 % Apply 1 application topically as needed for itching.     hyoscyamine (ANASPAZ)  0.125 MG TBDP disintergrating tablet Take 0.125 mg by mouth every 6 (six) hours as needed.  3   omeprazole (PRILOSEC) 20 MG capsule Take 20 mg by mouth daily.     traMADol (ULTRAM) 50 MG tablet as needed.     venlafaxine XR (EFFEXOR-XR) 37.5 MG 24 hr capsule Take 1 capsule (37.5 mg total) by mouth daily with breakfast. 90 capsule 4   No current facility-administered medications on file prior to visit.    LABS/IMAGING: No results found for this or any previous visit (from the past 48 hour(s)). No results found.  LIPID PANEL:    Component Value Date/Time   CHOL 255 (H) 06/06/2020 1010   TRIG 78 06/06/2020 1010   HDL 64 06/06/2020 1010   CHOLHDL 4.0 06/06/2020 1010   CHOLHDL 3.9 03/26/2017 0700   VLDL 11 03/26/2017 0700   LDLCALC 178 (H) 06/06/2020 1010    WEIGHTS: Wt Readings from Last 3 Encounters:  09/29/21 162 lb 6.4 oz (73.7 kg)  07/15/21 158 lb (71.7 kg)  06/03/21 157 lb 6.4 oz (71.4 kg)    VITALS: BP 128/72   Pulse 77   Ht 5' 3.75" (1.619 m)   Wt 162 lb 6.4 oz (73.7 kg)   SpO2 99%   BMI 28.09 kg/m   EXAM: Deferred  EKG: Deferred  ASSESSMENT: Mixed dyslipidemia, goal LDL less than 70 History of possible TIA History of PE 0 CAC score (03/2021)  PLAN: 1.   Ms. Semidey she has been compliant with her Repatha for more than 2 doses.  She will need reassessment of her lipids.  We will go ahead and obtain that.  Hopefully she will reach target LDL less than 70.  Although she initially felt that she may have symptoms with this it does now seem to be more tolerated.  If she is able to reach target we will continue this current therapy.  Plan otherwise follow-up with me annually or sooner as necessary.  Pixie Casino, MD, [facility], [place] Director of the Advanced Lipid Disorders &  Cardiovascular Risk Reduction Clinic Diplomate of the American Board of Clinical Lipidology Attending Cardiologist  Direct Dial: [phone]  Fax:  [phone]  Website:  [email or web address]  Nadean Corwin Nyliah Nierenberg 09/29/2021, 10:15 AM

## 2021-09-29 NOTE — Patient Instructions (Signed)
Medication Instructions:  NO CHANGES today   *If you need a refill on your cardiac medications before your next appointment, please call your pharmacy*   Lab Work: FASTING lab work as soon as able - NMR lipoprofile, [place] lab work in 1 year -- before next appointment   If you have labs (blood work) drawn today and your tests are completely normal, you will receive your results only by: MyChart Message (if you have MyChart) OR A paper copy in the mail If you have any lab test that is abnormal or we need to change your treatment, we will call you to review the results.   Testing/Procedures: NONE   Follow-Up: At [facility], you and your health needs are our priority.  As part of our continuing mission to provide you with exceptional heart care, we have created designated Provider Care Teams.  These Care Teams include your primary Cardiologist (physician) and Advanced Practice Providers (APPs -  Physician Assistants and Nurse Practitioners) who all work together to provide you with the care you need, when you need it.  We recommend signing up for the patient portal called "MyChart".  Sign up information is provided on this After Visit Summary.  MyChart is used to connect with patients for Virtual Visits (Telemedicine).  Patients are able to view lab/test results, encounter notes, upcoming appointments, etc.  Non-urgent messages can be sent to your provider as well.   To learn more about what you can do with MyChart, go to [email or web address].    Your next appointment:   12 month(s)  The format for your next appointment:   In Person  Provider:   Dr. Lyman Bishop -- lipid clinic

## 2021-10-01 LAB — NMR, LIPOPROFILE
Cholesterol, Total: 216 mg/dL — ABNORMAL HIGH (ref 100–199)
HDL Particle Number: 39.8 umol/L (ref >=30.5)
HDL-C: 76 mg/dL (ref >39)
LDL Particle Number: 1250 nmol/L — ABNORMAL HIGH (ref <1000)
LDL Size: 21.9 nm (ref >20.5)
LDL-C (NIH Calc): 125 mg/dL — ABNORMAL HIGH (ref 0–99)
LP-IR Score: <25 (ref <=45)
Small LDL Particle Number: 251 nmol/L (ref <=527)
Triglycerides: 87 mg/dL (ref 0–149)

## 2021-10-01 LAB — LIPOPROTEIN A (LPA): Lipoprotein (a): 96.3 nmol/L — ABNORMAL HIGH (ref <75.0)

## 2021-10-06 ENCOUNTER — Ambulatory Visit (HOSPITAL_BASED_OUTPATIENT_CLINIC_OR_DEPARTMENT_OTHER): Payer: Medicare Other | Admitting: Internal Medicine

## 2021-10-06 ENCOUNTER — Encounter (HOSPITAL_BASED_OUTPATIENT_CLINIC_OR_DEPARTMENT_OTHER): Payer: Self-pay | Admitting: Internal Medicine

## 2021-10-15 ENCOUNTER — Other Ambulatory Visit: Payer: Self-pay | Admitting: *Deleted

## 2021-10-15 DIAGNOSIS — E785 Hyperlipidemia, unspecified: Secondary | ICD-10-CM

## 2021-10-15 MED ORDER — EZETIMIBE 10 MG PO TABS: 10.0000 mg | ORAL_TABLET | Freq: Every day | ORAL | 3 refills | Status: DC

## 2021-10-21 ENCOUNTER — Telehealth: Payer: Self-pay | Admitting: Internal Medicine

## 2021-10-21 NOTE — Telephone Encounter (Signed)
Pt c/o medication issue:  1. Name of Medication:   Evolocumab (REPATHA SURECLICK) 182 MG/ML SOAJ    2. How are you currently taking this medication (dosage and times per day)? Inject 1 Dose into the skin every 14 (fourteen) days.  3. Are you having a reaction (difficulty breathing--STAT)? No  4. What is your medication issue? Would like to know if order can be sent due to medication not coming out when pt tried to inject.

## 2021-10-21 NOTE — Telephone Encounter (Signed)
Verbal order given to Traci Sermon D to send out a replacement

## 2021-12-08 ENCOUNTER — Other Ambulatory Visit: Payer: Self-pay | Admitting: *Deleted

## 2021-12-08 DIAGNOSIS — C50411 Malignant neoplasm of upper-outer quadrant of right female breast: Secondary | ICD-10-CM

## 2021-12-09 ENCOUNTER — Encounter: Payer: Self-pay | Admitting: Hematology and Oncology

## 2021-12-09 ENCOUNTER — Inpatient Hospital Stay (HOSPITAL_BASED_OUTPATIENT_CLINIC_OR_DEPARTMENT_OTHER): Payer: Medicare Other | Admitting: Hematology and Oncology

## 2021-12-09 ENCOUNTER — Inpatient Hospital Stay: Payer: Medicare Other | Attending: Hematology and Oncology

## 2021-12-09 ENCOUNTER — Other Ambulatory Visit: Payer: Self-pay

## 2021-12-09 VITALS — BP 142/63 | HR 71 | Temp 97.9°F | Resp 16 | Ht 63.0 in | Wt 164.0 lb

## 2021-12-09 DIAGNOSIS — Z923 Personal history of irradiation: Secondary | ICD-10-CM | POA: Insufficient documentation

## 2021-12-09 DIAGNOSIS — R232 Flushing: Secondary | ICD-10-CM | POA: Diagnosis not present

## 2021-12-09 DIAGNOSIS — Z87891 Personal history of nicotine dependence: Secondary | ICD-10-CM | POA: Insufficient documentation

## 2021-12-09 DIAGNOSIS — Z818 Family history of other mental and behavioral disorders: Secondary | ICD-10-CM | POA: Diagnosis not present

## 2021-12-09 DIAGNOSIS — Z8249 Family history of ischemic heart disease and other diseases of the circulatory system: Secondary | ICD-10-CM | POA: Diagnosis not present

## 2021-12-09 DIAGNOSIS — Z8049 Family history of malignant neoplasm of other genital organs: Secondary | ICD-10-CM | POA: Diagnosis not present

## 2021-12-09 DIAGNOSIS — Z800 Family history of malignant neoplasm of digestive organs: Secondary | ICD-10-CM | POA: Diagnosis not present

## 2021-12-09 DIAGNOSIS — Z79811 Long term (current) use of aromatase inhibitors: Secondary | ICD-10-CM | POA: Insufficient documentation

## 2021-12-09 DIAGNOSIS — Z86711 Personal history of pulmonary embolism: Secondary | ICD-10-CM | POA: Insufficient documentation

## 2021-12-09 DIAGNOSIS — E7800 Pure hypercholesterolemia, unspecified: Secondary | ICD-10-CM | POA: Diagnosis not present

## 2021-12-09 DIAGNOSIS — Z170 Estrogen receptor positive status [ER+]: Secondary | ICD-10-CM | POA: Diagnosis not present

## 2021-12-09 DIAGNOSIS — C50411 Malignant neoplasm of upper-outer quadrant of right female breast: Secondary | ICD-10-CM | POA: Insufficient documentation

## 2021-12-09 DIAGNOSIS — Z823 Family history of stroke: Secondary | ICD-10-CM | POA: Diagnosis not present

## 2021-12-09 DIAGNOSIS — Z79899 Other long term (current) drug therapy: Secondary | ICD-10-CM | POA: Insufficient documentation

## 2021-12-09 DIAGNOSIS — N6489 Other specified disorders of breast: Secondary | ICD-10-CM | POA: Diagnosis not present

## 2021-12-09 DIAGNOSIS — Z881 Allergy status to other antibiotic agents status: Secondary | ICD-10-CM | POA: Insufficient documentation

## 2021-12-09 LAB — CBC WITH DIFFERENTIAL (CANCER CENTER ONLY)
Abs Immature Granulocytes: 0.01 10*3/uL (ref 0.00–0.07)
Basophils Absolute: 0 10*3/uL (ref 0.0–0.1)
Basophils Relative: 1 %
Eosinophils Absolute: 0.2 10*3/uL (ref 0.0–0.5)
Eosinophils Relative: 3 %
HCT: 38.9 % (ref 36.0–46.0)
Hemoglobin: 13.3 g/dL (ref 12.0–15.0)
Immature Granulocytes: 0 %
Lymphocytes Relative: 32 %
Lymphs Abs: 2.5 10*3/uL (ref 0.7–4.0)
MCH: 32.7 pg (ref 26.0–34.0)
MCHC: 34.2 g/dL (ref 30.0–36.0)
MCV: 95.6 fL (ref 80.0–100.0)
Monocytes Absolute: 0.7 10*3/uL (ref 0.1–1.0)
Monocytes Relative: 9 %
Neutro Abs: 4.4 10*3/uL (ref 1.7–7.7)
Neutrophils Relative %: 55 %
Platelet Count: 293 10*3/uL (ref 150–400)
RBC: 4.07 MIL/uL (ref 3.87–5.11)
RDW: 12.4 % (ref 11.5–15.5)
WBC Count: 7.8 10*3/uL (ref 4.0–10.5)
nRBC: 0 % (ref 0.0–0.2)

## 2021-12-09 LAB — CMP (CANCER CENTER ONLY)
ALT: 13 U/L (ref 0–44)
AST: 19 U/L (ref 15–41)
Albumin: 4.4 g/dL (ref 3.5–5.0)
Alkaline Phosphatase: 64 U/L (ref 38–126)
Anion gap: 6 (ref 5–15)
BUN: 17 mg/dL (ref 8–23)
CO2: 28 mmol/L (ref 22–32)
Calcium: 10 mg/dL (ref 8.9–10.3)
Chloride: 103 mmol/L (ref 98–111)
Creatinine: 0.76 mg/dL (ref 0.44–1.00)
GFR, Estimated: >60 mL/min (ref >60)
Glucose, Bld: 93 mg/dL (ref 70–99)
Potassium: 4.4 mmol/L (ref 3.5–5.1)
Sodium: 137 mmol/L (ref 135–145)
Total Bilirubin: 0.4 mg/dL (ref 0.3–1.2)
Total Protein: 7.5 g/dL (ref 6.5–8.1)

## 2021-12-09 NOTE — Progress Notes (Signed)
[facility]  [phone] [phone]     ID: Melissa [name] DOB: 09/01/1950  MR#: [id number]  CSN#:[id number]  Patient Care Team: Kaplan, Kristen W., PA-C as PCP - General (Family Medicine) End, Christopher, MD as PCP - Cardiology (Cardiology) Cornett, Thomas, MD as Consulting Physician (General Surgery) Moody, John, MD as Consulting Physician (Radiation Oncology) Wilson, Fred H, MD as Consulting Physician (Family Medicine) Kaplan, Kristen W., PA-C as Physician Assistant (Family Medicine) Medoff, Jeffrey, MD as Consulting Physician (Gastroenterology) Causey, Lindsey Cornetto, NP as Nurse Practitioner (Hematology and Oncology) Iruku, Praveena, MD as Medical Oncologist (Hematology and Oncology)  CHIEF COMPLAINT: Estrogen receptor positive breast cancer  CURRENT TREATMENT: Anastrozole; alendronate   INTERVAL HISTORY:  [name] returns today for follow up of her estrogen receptor positive breast cancer.   She continues on anastrozole.   She continues to tolerate hot flashes reasonably well.  She is using Replens for vaginal dryness.  She denies any other changes today.  Mammogram from May 2023, BI-RADS 2, no evidence of malignancy.  COVID 19 VACCINATION STATUS: fully vaccinated (Pfizer), status post 1 booster  HISTORY OF CURRENT ILLNESS: From the original intake note:  [name] had routine screening bilateral mammography with tomography at [facility] on 07/12/2017 showing; breast density category C. There was a possible mass in the right breast. On 07/14/2017 she completed unilateral right ultrasonography at [facility] that showed an irregular hypoechoic 1.6 cm mass in the right upper outer quadrant and posterior depth. Evaluation of the right axilla showed 4 lymph nodes with cortical thickening suspicious of malignancy.   Accordingly on 07/19/2017 she proceeded to biopsy of the right breast area and 1 lymph node in question. 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  The lymph node was biopsied showing no evidence of carcinoma, which was found to be concordant.  Prognostic indicators significant for: estrogen receptor, 95% positive and progesterone receptor, 95% positive, both with strong staining intensity. Proliferation marker Ki67 at 10% . HER2 not amplified with ratios HER2/CEP17 signals 1.22 and average HER2 copies per cell 1.95  The patient's subsequent history is as detailed below.   PAST MEDICAL HISTORY: Past Medical History:  Diagnosis Date   Anxiety    Arthritis    shoulders, hands   Cancer (HCC) 07/2017   right breast cancer   Chest pain    Diverticulosis    GERD (gastroesophageal reflux disease)    High cholesterol    IBS (irritable bowel syndrome)    Kidney disorder    Pt states only has left kidney   Panic attacks    Pulmonary embolism (HCC) 2018   pt states resolved and no longer takes blood thinners  Small pulmonary emboli - eliquis for 6 months- resolved.  Depressive and anxiety moods - xanax One functioning Kidney   PAST SURGICAL HISTORY: Past Surgical History:  Procedure Laterality Date   BREAST LUMPECTOMY WITH RADIOACTIVE SEED AND SENTINEL LYMPH NODE BIOPSY Right 08/23/2017   Procedure: BREAST LUMPECTOMY WITH RADIOACTIVE SEED AND SENTINEL LYMPH NODE BIOPSY;  Surgeon: Cornett, Thomas, MD;  Location: [facility];  Service: General;  Laterality: Right;   TUBAL LIGATION      FAMILY HISTORY Family History  Problem Relation Age of Onset   Alzheimer's disease Mother    Stroke Father 65   Heart attack Maternal Grandmother    Heart attack Paternal Grandmother    Cervical cancer Cousin    Colon   cancer Paternal Aunt    Colon cancer Cousin   The patient's father died at age 35 due to CHF. The patient's mother died at age 78 due to Alzheimer's. The patient has 1 brother and 4 sisters. There was a  paternal 1st cousin diagnosed with cervical cancer at age 9. There was a paternal aunt diagnosed with colon cancer at age 14. There was a paternal 1st cousin diagnosed with colon cancer at age 38. The patient denies a family history of breast or ovarian cancer.     GYNECOLOGIC HISTORY:  No LMP recorded. Patient is postmenopausal. Menarche: 71 years old Age at first live birth: 71 years old She is GX P1. Her LMP was in 2008. She used oral contraception with no complications. She did not used HRT.     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 Her husband, [name], died in 11/23/16 due to pancreatic cancer. He was a Furniture conservator/restorer. She has 39 year old dog, a Web designer. The patient's daughter, [name] lives in [place] and works at [employer]. [name] has two blood grandchildren and two step-grandchildren.   ADVANCED DIRECTIVES: The patient plans to name her daughter, [name] as her HCPOA. The appropriate forms were given at the 07/27/2017 visit.    HEALTH MAINTENANCE: Social History   Tobacco Use   Smoking status: Former    Packs/day: 20.00    Years: 1.00    Total pack years: 20.00    Types: Cigarettes    Quit date: 75    Years since quitting: 38.8   Smokeless tobacco: Never  Substance Use Topics   Alcohol use: Yes    Comment: social   Drug use: Never    Colonoscopy: Feb. 2019/ [facility]/ diverticulosis  PAP: Nov. 2018  Bone density: Nov. 2018 / osteopenia   Allergies  Allergen Reactions   Clindamycin Diarrhea and Nausea Only   Atorvastatin     Myalgias, GI upset   Rosuvastatin     Myalgias, GI upset    Current Outpatient Medications  Medication Sig Dispense Refill   acetaminophen (TYLENOL) 500 MG tablet Take 1,000 mg by mouth every 6 (six) hours as needed for mild pain.     ALPRAZolam (XANAX) 0.25 MG tablet 3 (three) times daily. TAKE 1/2 TABLET IN THE MORNING AND 1/2 IN THE EVENING AT BEDTIME     anastrozole  (ARIMIDEX) 1 MG tablet Take 1 tablet (1 mg total) by mouth daily. 90 tablet 4   cholecalciferol 2000 units tablet Take 2,000 Units by mouth daily.      Evolocumab (REPATHA SURECLICK) 174 MG/ML SOAJ Inject 1 Dose into the skin every 14 (fourteen) days. 6 mL 3   ezetimibe (ZETIA) 10 MG tablet Take 1 tablet (10 mg total) by mouth daily. 90 tablet 3   hydrocortisone cream 1 % Apply 1 application topically as needed for itching.     hyoscyamine (ANASPAZ) 0.125 MG TBDP disintergrating tablet Take 0.125 mg by mouth every 6 (six) hours as needed.  3   omeprazole (PRILOSEC) 20 MG capsule Take 20 mg by mouth daily.     traMADol (ULTRAM) 50 MG tablet as needed.     venlafaxine XR (EFFEXOR-XR) 37.5 MG 24 hr capsule Take 1 capsule (37.5 mg total) by mouth daily with breakfast. 90 capsule 4   No current facility-administered medications for this visit.    OBJECTIVE:  white woman who appears younger than stated age  62:   12/09/21 1026  BP: Marland Kitchen)  142/63  Pulse: 71  Resp: 16  Temp: 97.9 F (36.6 C)  SpO2: 97%      Body mass index is 29.05 kg/m.   Wt Readings from Last 3 Encounters:  12/09/21 164 lb (74.4 kg)  09/29/21 162 lb 6.4 oz (73.7 kg)  07/15/21 158 lb (71.7 kg)     ECOG FS:1 - Symptomatic but completely ambulatory   Physical Exam Constitutional:      Appearance: Normal appearance.  Cardiovascular:     Rate and Rhythm: Normal rate and regular rhythm.     Pulses: Normal pulses.     Heart sounds: Normal heart sounds.  Chest:     Comments: Bilateral breasts inspected.  No palpable masses or regional adenopathy Musculoskeletal:     Cervical back: Normal range of motion. No rigidity.  Lymphadenopathy:     Cervical: No cervical adenopathy.  Neurological:     Mental Status: She is alert.      LAB RESULTS:  CMP     Component Value Date/Time   NA 142 12/11/2020 1002   K 3.5 12/11/2020 1002   CL 104 12/11/2020 1002   CO2 27 12/11/2020 1002   GLUCOSE 97 12/11/2020 1002    BUN 14 12/11/2020 1002   CREATININE 0.80 12/11/2020 1002   CREATININE 0.85 08/02/2018 0904   CALCIUM 9.7 12/11/2020 1002   PROT 7.7 12/11/2020 1002   PROT 7.4 06/06/2020 1010   ALBUMIN 4.2 12/11/2020 1002   ALBUMIN 4.7 06/06/2020 1010   AST 20 12/11/2020 1002   AST 23 08/02/2018 0904   ALT 13 12/11/2020 1002   ALT 18 08/02/2018 0904   ALKPHOS 71 12/11/2020 1002   BILITOT 0.5 12/11/2020 1002   BILITOT 0.4 06/06/2020 1010   BILITOT 0.5 08/02/2018 0904   GFRNONAA >60 12/11/2020 1002   GFRNONAA >60 08/02/2018 0904   GFRAA >60 12/04/2018 1337   GFRAA >60 08/02/2018 0904    Lab Results  Component Value Date   WBC 7.8 12/09/2021   NEUTROABS 4.4 12/09/2021   HGB 13.3 12/09/2021   HCT 38.9 12/09/2021   MCV 95.6 12/09/2021   PLT 293 12/09/2021   No results found for: "LABCA2"  No components found for: "LABCAN125"  No results for input(s): "INR" in the last 168 hours.  No results found for: "LABCA2"  No results found for: "CAN199"  No results found for: "CAN125"  No results found for: "CAN153"  No results found for: "CA2729"  No components found for: "HGQUANT"  No results found for: "CEA1", "CEA" / No results found for: "CEA1", "CEA"   No results found for: "AFPTUMOR"  No results found for: "CHROMOGRNA"  No results found for: "TOTALPROTELP", "ALBUMINELP", "A1GS", "A2GS", "BETS", "BETA2SER", "GAMS", "MSPIKE", "SPEI" (this displays SPEP labs)  No results found for: "KPAFRELGTCHN", "LAMBDASER", "KAPLAMBRATIO" (kappa/lambda light chains)  No results found for: "HGBA", "HGBA2QUANT", "HGBFQUANT", "HGBSQUAN" (Hemoglobinopathy evaluation)   No results found for: "LDH"  No results found for: "IRON", "TIBC", "IRONPCTSAT" (Iron and TIBC)  No results found for: "FERRITIN"  Urinalysis    Component Value Date/Time   COLORURINE STRAW (A) 11/10/2018 1242   APPEARANCEUR CLEAR 11/10/2018 1242   LABSPEC <1.005 (L) 11/10/2018 1242   PHURINE 7.0 11/10/2018 1242    GLUCOSEU NEGATIVE 11/10/2018 1242   HGBUR TRACE (A) 11/10/2018 1242   BILIRUBINUR NEGATIVE 11/10/2018 1242   KETONESUR NEGATIVE 11/10/2018 1242   PROTEINUR NEGATIVE 11/10/2018 1242   NITRITE NEGATIVE 11/10/2018 1242   LEUKOCYTESUR NEGATIVE 11/10/2018 1242    STUDIES: No   results found.   ELIGIBLE FOR AVAILABLE RESEARCH PROTOCOL: no   ASSESSMENT: 71 y.o. [place] woman status post right breast upper outer quadrant biopsy 07/19/2017 for a clinical T1c NX, stage IA invasive ductal carcinoma, grade 2, estrogen and progesterone receptor positive, HER-2 not amplified, with an MIB-1 of 10%  (a) 1 of 4 suspicious axillary lymph nodes biopsied 07/19/2017 was negative  (1) status post right lumpectomy and sentinel lymph node sampling 08/23/2017 for a pT1c pN0, stage IA invasive ductal carcinoma, grade 2, with negative margins  (a) total of 5 sentinel lymph nodes removed  (2) The Oncotype DX score was 12, predicting a risk of outside the breast recurrence over the next 9 years of 3% if the patient's only systemic therapy is tamoxifen for 5 years.  It also predicts no significant benefit from chemotherapy.  (3) adjuvant radiation completed 09/27/2017 - 10/24/2017  Site/dose: The patient initially received a dose of 42.56 Gy in 16 fractions to the breast using whole-breast tangent fields. This was delivered using a 3-D conformal technique. The patient then received a boost to the seroma. This delivered an additional 8 Gy in 4 fractions using a 3 field photon technique due to the depth of the seroma. The total dose was 50.56 Gy.  (4) anastrozole started 11/25/2017  (a) density 05/10/2016 showed a T score of -1.9  (b) repeat DEXA scan 06/29/2018 showed a T score of -2.3  (c) alendronate/Fosamax started 08/02/2018  (5) history of acute right lower lobe pulmonary embolus 03/19/2016; bilateral Dopplers lower extremity same day were clear  (a) status post apixaban x6 months   PLAN:  Ms. Vianna is  here for follow-up.  She is tolerating anastrozole well.   She will complete 5 years of antiestrogen therapy in November 2024.   No concern for recurrence. Most recent physical exam, no palpable masses or regional adenopathy. She had some questions about using Repatha and liver function.  I have encouraged her to talk to her primary care doctor as well about this.  From breast cancer standpoint, there is no contraindication to use it nor there is any significant interaction with anastrozole. Her labs from today show no evidence of transaminitis. She will continue anastrozole and return to clinic in 1 year or sooner as needed.  Total encounter time 30 minutes.*  *Total Encounter Time as defined by the Centers for Medicare and Medicaid Services includes, in addition to the face-to-face time of a patient visit (documented in the note above) non-face-to-face time: obtaining and reviewing outside history, ordering and reviewing medications, tests or procedures, care coordination (communications with other health care professionals or caregivers) and documentation in the medical record. 

## 2021-12-21 ENCOUNTER — Encounter (HOSPITAL_BASED_OUTPATIENT_CLINIC_OR_DEPARTMENT_OTHER): Payer: Self-pay | Admitting: Internal Medicine

## 2021-12-22 NOTE — Telephone Encounter (Signed)
Called pt and she is convinced that her side effects stomach issues (her BM are not normal) and joint pain. She has been told that these are not from the [place]. She has stopped the Zetia as well. Informed pt that if she is still having sx after stopping the Zetia they are not from the [place] but, she "will not restart, she is afraid of the permanent side effects listed on the internet".

## 2022-01-11 ENCOUNTER — Telehealth: Payer: Self-pay | Admitting: Internal Medicine

## 2022-01-11 NOTE — Telephone Encounter (Signed)
PA for repatha submitted via CMM (Key: [id number])

## 2022-01-12 NOTE — Telephone Encounter (Signed)
Request Reference Number: [id number]. REPATHA SURE INJ '140MG'$ /ML is approved through 01/25/2023. Your patient may now fill this prescription and it will be covered.

## 2022-01-13 ENCOUNTER — Other Ambulatory Visit: Payer: Self-pay

## 2022-01-13 MED ORDER — ANASTROZOLE 1 MG PO TABS: 1.0000 mg | ORAL_TABLET | Freq: Every day | ORAL | 4 refills | Status: DC

## 2022-02-01 ENCOUNTER — Encounter: Payer: Self-pay | Admitting: Internal Medicine

## 2022-02-09 LAB — NMR, LIPOPROFILE
Cholesterol, Total: 200 mg/dL — ABNORMAL HIGH (ref 100–199)
HDL Particle Number: 42.2 umol/L (ref >=30.5)
HDL-C: 67 mg/dL (ref >39)
LDL Particle Number: 1190 nmol/L — ABNORMAL HIGH (ref <1000)
LDL Size: 21.7 nm (ref >20.5)
LDL-C (NIH Calc): 116 mg/dL — ABNORMAL HIGH (ref 0–99)
LP-IR Score: <25 (ref <=45)
Small LDL Particle Number: 328 nmol/L (ref <=527)
Triglycerides: 98 mg/dL (ref 0–149)

## 2022-03-25 ENCOUNTER — Ambulatory Visit: Payer: Medicare Other | Admitting: Physician Assistant

## 2022-04-26 ENCOUNTER — Other Ambulatory Visit: Payer: Self-pay | Admitting: Internal Medicine

## 2022-04-26 DIAGNOSIS — G459 Transient cerebral ischemic attack, unspecified: Secondary | ICD-10-CM

## 2022-04-26 DIAGNOSIS — I700 Atherosclerosis of aorta: Secondary | ICD-10-CM

## 2022-04-26 DIAGNOSIS — E7800 Pure hypercholesterolemia, unspecified: Secondary | ICD-10-CM

## 2022-04-29 NOTE — Progress Notes (Signed)
Cardiology Office Note:    Date:  05/13/2022   ID:  Melissa [name], DOB [birth year], MRN [id number]  PCP:  Richmond Campbell PA-C   [facility] Providers Cardiologist:  Nanetta Batty, MD     Referring MD: Richmond Campbell., PA-C   Chief Complaint  Patient presents with   Follow-up    HLD    History of Present Illness:    Melissa [name] is a 72 y.o. female with a hx of with HLD,  breast cancer s/p radiation, TIA, and PACs/PVCs. She has a POET 04/27/17 that was nonischemic. Event monitor 02/27/19 showed PVCs, short runs of SVT. Echo showed preserved EF and no significant valvular disease. She was seen by [facility] for TIA type symptoms 03/06/21. Echo as part of that workup showed possible left to right shunt through a PFO.  Repeat heart monitor 04/08/21 showed SR, occasional PACs/PVCs but no Afib. Coronary calcium score 03/2021 was zero. She has been statin intolerant and apparently started on Repatha. She was also seen by pharmD and Dr. Rennis Golden in the advanced lipid clinic.  She had COVID-19 infection earlier this year, she did not require hospitalization.   She presents today for routine cardiology follow up.  This was an appt that was rescheduled from Dr. Allyson Sabal. She is not tolerating repatha due to UTIs, muscle pain, abdominal cramping, and elevated BG levels. She questions if the BG levels is due to anastrozole - she has one year left. She has also had dizziness. She has been very inconsistent with repatha due to these side effects. Symptoms reside after about 2 weeks after repatha. She has not taken repatha since Feb when she had COVID-19.   She does snore, but denies gasping for air. She denies cardiac symptoms. She is getting married this year, but has a lot of anxiety regarding her cancer check up in June (mammogram) and oncology appt in Nov.   She walks about 1 mile twice daily with her dog, remains active around the house.    Past Medical History:  Diagnosis Date   Anxiety     Arthritis    shoulders, hands   Cancer 07/2017   right breast cancer   Chest pain    Diverticulosis    GERD (gastroesophageal reflux disease)    High cholesterol    IBS (irritable bowel syndrome)    Kidney disorder    Pt states only has left kidney   Panic attacks    Pulmonary embolism 2018   pt states resolved and no longer takes blood thinners    Past Surgical History:  Procedure Laterality Date   BREAST LUMPECTOMY WITH RADIOACTIVE SEED AND SENTINEL LYMPH NODE BIOPSY Right 08/23/2017   Procedure: BREAST LUMPECTOMY WITH RADIOACTIVE SEED AND SENTINEL LYMPH NODE BIOPSY;  Surgeon: Harriette Bouillon, MD;  Location: [facility];  Service: General;  Laterality: Right;   TUBAL LIGATION      Current Medications: Current Meds  Medication Sig   acetaminophen (TYLENOL) 500 MG tablet Take 1,000 mg by mouth every 6 (six) hours as needed for mild pain.   ALPRAZolam (XANAX) 0.25 MG tablet 3 (three) times daily. TAKE 1/2 TABLET IN THE MORNING AND 1/2 IN THE EVENING AT BEDTIME   anastrozole (ARIMIDEX) 1 MG tablet Take 1 tablet (1 mg total) by mouth daily.   aspirin EC 81 MG tablet Take 81 mg by mouth daily.   cholecalciferol 2000 units tablet Take 2,000 Units by mouth daily.    hydrocortisone cream 1 %  Apply 1 application topically as needed for itching.   hyoscyamine (ANASPAZ) 0.125 MG TBDP disintergrating tablet Take 0.125 mg by mouth every 6 (six) hours as needed.   omeprazole (PRILOSEC) 20 MG capsule Take 20 mg by mouth daily.   traMADol (ULTRAM) 50 MG tablet as needed.   [DISCONTINUED] Evolocumab (REPATHA SURECLICK) 140 MG/ML SOAJ INJECT 1 DOSE INTO THE SKIN EVERY 14 (FOURTEEN) DAYS.     Allergies:   Clindamycin, Venlafaxine, Atorvastatin, and Rosuvastatin   Social History   Socioeconomic History   Marital status: Widowed    Spouse name: Not on file   Number of children: Not on file   Years of education: Not on file   Highest education level: Not on file  Occupational  History   Not on file  Tobacco Use   Smoking status: Former    Packs/day: 20.00    Years: 1.00    Additional pack years: 0.00    Total pack years: 20.00    Types: Cigarettes    Quit date: 11    Years since quitting: 39.3   Smokeless tobacco: Never  Substance and Sexual Activity   Alcohol use: Yes    Comment: social   Drug use: Never   Sexual activity: Not Currently  Other Topics Concern   Not on file  Social History Narrative   09-13-17 Unable to ask abuse questions daughter with her today.   Social Determinants of Health   Financial Resource Strain: Not on file  Food Insecurity: Not on file  Transportation Needs: Not on file  Physical Activity: Not on file  Stress: Not on file  Social Connections: Not on file     Family History: The patient's family history includes Alzheimer's disease in her mother; Cervical cancer in her cousin; Colon cancer in her cousin and paternal aunt; Heart attack in her maternal grandmother and paternal grandmother; Stroke (age of onset: 39) in her father.  ROS:   Please see the history of present illness.     All other systems reviewed and are negative.  EKGs/Labs/Other Studies Reviewed:    The following studies were reviewed today:  Coronary calcium score 03/2021: IMPRESSION: 1.  Coronary calcium score of 0.   2. Mild descending aortic atherosclerosis.   Heart monitor 04/08/21: Patient had a min HR of 50 bpm, max HR of 184 bpm, and avg HR of 74 bpm. Predominant underlying rhythm was Sinus Rhythm. 1 run of Ventricular Tachycardia occurred lasting 4 beats with a max rate of 184 bpm (avg 170 bpm). 15 Supraventricular Tachycardia  runs occurred, the run with the fastest interval lasting 5 beats with a max rate of 160 bpm, the longest lasting 10.4 secs with an avg rate of 123 bpm. Isolated SVEs were rare (<1.0%), SVE Couplets were rare (<1.0%), and SVE Triplets were rare (<1.0%).  Isolated VEs were rare (<1.0%), VE Couplets were rare  (<1.0%), and no VE Triplets were present.    1. SR/SB/ST 2. Occasional PACs/PVCs 3. Rare short runs of NSVT and SVT 4. No evid of PAF   EKG:  EKG is ordered today.  The ekg ordered today demonstrates sinus rhythm with sinus arrhythmia HR 66  Recent Labs: 12/09/2021: ALT 13; BUN 17; Creatinine 0.76; Hemoglobin 13.3; Platelet Count 293; Potassium 4.4; Sodium 137  Recent Lipid Panel    Component Value Date/Time   CHOL 255 (H) 06/06/2020 1010   TRIG 78 06/06/2020 1010   HDL 64 06/06/2020 1010   CHOLHDL 4.0 06/06/2020 1010  CHOLHDL 3.9 03/26/2017 0700   VLDL 11 03/26/2017 0700   LDLCALC 178 (H) 06/06/2020 1010     Risk Assessment/Calculations:                Physical Exam:    VS:  BP 130/82   Pulse 66   Ht  (1.6 m)   Wt 160 lb (72.6 kg)   SpO2 98%   BMI 28.34 kg/m     Wt Readings from Last 3 Encounters:  05/13/22 160 lb (72.6 kg)  12/09/21 164 lb (74.4 kg)  09/29/21 162 lb 6.4 oz (73.7 kg)     GEN:  Well nourished, well developed in no acute distress HEENT: Normal NECK: No JVD; No carotid bruits LYMPHATICS: No lymphadenopathy CARDIAC: RRR, no murmurs, rubs, gallops RESPIRATORY:  Clear to auscultation without rales, wheezing or rhonchi  ABDOMEN: Soft, non-tender, non-distended MUSCULOSKELETAL:  No edema; No deformity  SKIN: Warm and dry NEUROLOGIC:  Alert and oriented x 3 PSYCHIATRIC:  Normal affect   ASSESSMENT:    1. Mixed hyperlipidemia   2. Dyslipidemia, goal LDL below 70   3. Statin intolerance   4. Palpitations   5. Panic attacks   6. TIA (transient ischemic attack)    PLAN:    In order of problems listed above:  Hyperlipidemia Intolerant to statins Stopped repatha Feb 2024  Never started zetia Side effects as above We discussed switching to praluent to see if she has an improvement in side effects, she does not want to do this This is causing her a lot of anxiety. She prefers to wait and speak with Dr. Rennis Golden about next steps. We  will collect a repeat lipid panel and A1c (she reports elevated BG) so they will have data to discuss in May.    TIA Monitors have been negative for Afib   Palpitations Occur with panic attacks, managed with benzo   Follow up with Dr. Rennis Golden as planned, Dr. Allyson Sabal in 1 year.     Medication Adjustments/Labs and Tests Ordered: Current medicines are reviewed at length with the patient today.  Concerns regarding medicines are outlined above.  Orders Placed This Encounter  Procedures   Lipid panel   Hemoglobin A1c   EKG 12-Lead   No orders of the defined types were placed in this encounter.   Patient Instructions  Medication Instructions:  No Changes *If you need a refill on your cardiac medications before your next appointment, please call your pharmacy*   Lab Work: Lipid Panel, Hemoglobin A1C. If you have labs (blood work) drawn today and your tests are completely normal, you will receive your results only by: MyChart Message (if you have MyChart) OR A paper copy in the mail If you have any lab test that is abnormal or we need to change your treatment, we will call you to review the results.   Testing/Procedures: No Testing   Follow-Up: At [facility], you and your health needs are our priority.  As part of our continuing mission to provide you with exceptional heart care, we have created designated Provider Care Teams.  These Care Teams include your primary Cardiologist (physician) and Advanced Practice Providers (APPs -  Physician Assistants and Nurse Practitioners) who all work together to provide you with the care you need, when you need it.  We recommend signing up for the patient portal called "MyChart".  Sign up information is provided on this After Visit Summary.  MyChart is used to connect with patients for Virtual  Visits (Telemedicine).  Patients are able to view lab/test results, encounter notes, upcoming appointments, etc.  Non-urgent messages can be  sent to your provider as well.   To learn more about what you can do with MyChart, go to [email or web address].    Your next appointment:   1 year(s)  Provider:   Nanetta Batty, MD    Signed, Roe Rutherford Ahley Bulls, [place]  05/13/2022 8:48 AM    [facility]

## 2022-05-13 ENCOUNTER — Ambulatory Visit: Payer: Medicare Other | Attending: Physician Assistant | Admitting: Physician Assistant

## 2022-05-13 ENCOUNTER — Encounter: Payer: Self-pay | Admitting: Physician Assistant

## 2022-05-13 VITALS — BP 130/82 | HR 66 | Ht 63.0 in | Wt 160.0 lb

## 2022-05-13 DIAGNOSIS — R739 Hyperglycemia, unspecified: Secondary | ICD-10-CM

## 2022-05-13 DIAGNOSIS — Z789 Other specified health status: Secondary | ICD-10-CM | POA: Diagnosis not present

## 2022-05-13 DIAGNOSIS — Z131 Encounter for screening for diabetes mellitus: Secondary | ICD-10-CM

## 2022-05-13 DIAGNOSIS — F410 Panic disorder [episodic paroxysmal anxiety] without agoraphobia: Secondary | ICD-10-CM

## 2022-05-13 DIAGNOSIS — E785 Hyperlipidemia, unspecified: Secondary | ICD-10-CM | POA: Diagnosis not present

## 2022-05-13 DIAGNOSIS — E782 Mixed hyperlipidemia: Secondary | ICD-10-CM

## 2022-05-13 DIAGNOSIS — G459 Transient cerebral ischemic attack, unspecified: Secondary | ICD-10-CM

## 2022-05-13 DIAGNOSIS — R002 Palpitations: Secondary | ICD-10-CM

## 2022-05-13 NOTE — Patient Instructions (Signed)
Medication Instructions:  No Changes *If you need a refill on your cardiac medications before your next appointment, please call your pharmacy*   Lab Work: Lipid Panel, Hemoglobin A1C. If you have labs (blood work) drawn today and your tests are completely normal, you will receive your results only by: MyChart Message (if you have MyChart) OR A paper copy in the mail If you have any lab test that is abnormal or we need to change your treatment, we will call you to review the results.   Testing/Procedures: No Testing   Follow-Up: At [facility], you and your health needs are our priority.  As part of our continuing mission to provide you with exceptional heart care, we have created designated Provider Care Teams.  These Care Teams include your primary Cardiologist (physician) and Advanced Practice Providers (APPs -  Physician Assistants and Nurse Practitioners) who all work together to provide you with the care you need, when you need it.  We recommend signing up for the patient portal called "MyChart".  Sign up information is provided on this After Visit Summary.  MyChart is used to connect with patients for Virtual Visits (Telemedicine).  Patients are able to view lab/test results, encounter notes, upcoming appointments, etc.  Non-urgent messages can be sent to your provider as well.   To learn more about what you can do with MyChart, go to [email or web address].    Your next appointment:   1 year(s)  Provider:   Nanetta Batty, MD

## 2022-05-25 LAB — LIPID PANEL: LDL Chol Calc (NIH): 180 mg/dL — ABNORMAL HIGH (ref 0–99)

## 2022-05-26 LAB — LIPID PANEL
Chol/HDL Ratio: 3.9 ratio (ref 0.0–4.4)
Cholesterol, Total: 257 mg/dL — ABNORMAL HIGH (ref 100–199)
HDL: 66 mg/dL (ref >39)
Triglycerides: 70 mg/dL (ref 0–149)
VLDL Cholesterol Cal: 11 mg/dL (ref 5–40)

## 2022-05-26 LAB — HEMOGLOBIN A1C
Est. average glucose Bld gHb Est-mCnc: 114 mg/dL
Hgb A1c MFr Bld: 5.6 % (ref 4.8–5.6)

## 2022-06-15 ENCOUNTER — Encounter: Payer: Self-pay | Admitting: Internal Medicine

## 2022-06-15 ENCOUNTER — Ambulatory Visit: Payer: Medicare Other | Attending: Internal Medicine | Admitting: Internal Medicine

## 2022-06-15 VITALS — BP 138/78 | HR 78 | Ht 63.0 in | Wt 160.4 lb

## 2022-06-15 DIAGNOSIS — Z8673 Personal history of transient ischemic attack (TIA), and cerebral infarction without residual deficits: Secondary | ICD-10-CM | POA: Diagnosis not present

## 2022-06-15 DIAGNOSIS — E785 Hyperlipidemia, unspecified: Secondary | ICD-10-CM | POA: Diagnosis not present

## 2022-06-15 DIAGNOSIS — M791 Myalgia, unspecified site: Secondary | ICD-10-CM

## 2022-06-15 DIAGNOSIS — T466X5A Adverse effect of antihyperlipidemic and antiarteriosclerotic drugs, initial encounter: Secondary | ICD-10-CM

## 2022-06-15 MED ORDER — EZETIMIBE 10 MG PO TABS: 10.0000 mg | ORAL_TABLET | Freq: Every day | ORAL | 3 refills | Status: DC

## 2022-06-15 NOTE — Patient Instructions (Signed)
Medication Instructions:  STOP Repatha START Ezetimibe (Zetia) 10mg  once daily  *If you need a refill on your cardiac medications before your next appointment, please call your pharmacy*   Lab Work: FASTING lab work in about 4 months  If you have labs (blood work) drawn today and your tests are completely normal, you will receive your results only by: [employer] (if you have MyChart) OR A paper copy in the mail If you have any lab test that is abnormal or we need to change your treatment, we will call you to review the results.   Follow-Up: At [facility], you and your health needs are our priority.  As part of our continuing mission to provide you with exceptional heart care, we have created designated Provider Care Teams.  These Care Teams include your primary Cardiologist (physician) and Advanced Practice Providers (APPs -  Physician Assistants and Nurse Practitioners) who all work together to provide you with the care you need, when you need it.  We recommend signing up for the patient portal called "MyChart".  Sign up information is provided on this After Visit Summary.  MyChart is used to connect with patients for Virtual Visits (Telemedicine).  Patients are able to view lab/test results, encounter notes, upcoming appointments, etc.  Non-urgent messages can be sent to your provider as well.   To learn more about what you can do with MyChart, go to [email or web address].    Your next appointment:    4 months with Dr. Rennis Golden

## 2022-06-15 NOTE — Progress Notes (Signed)
LIPID CLINIC CONSULT NOTE  Chief Complaint:  Follow-up dyslipidemia  Primary Care Physician: Richmond Campbell., PA-C  Primary Cardiologist:  Nanetta Batty, MD  HPI:  Melissa [name] is a 72 y.o. female who is being seen today for the evaluation of dyslipidemia at the request of Arlyce Dice, Kristen W., PA-C. this is a pleasant 72 year old female kindly referred for evaluation management of dyslipidemia.  She reports a longstanding cholesterol disorder but has been intolerant to statins.  In the distant past she was placed on rosuvastatin but could not tolerate it and recently restarted it but had issues with myalgias.  She reports she has not tried any other statins.  Other medical problems include history of pulmonary embolism remotely and then more recently she had possible TIA.  She is scheduled for a TEE to evaluate a possible PFO.  She was noted however to have high cholesterol with recent total cholesterol of 251, triglycerides 101, HDL 61 and LDL 170.  Given her history of TIA, her target LDL cholesterol is less than 70.  She does report some intake of saturated fats.  In addition she reports eating 2 eggs daily.  She did have a recent coronary calcium score which was 0 and is somewhat reassuring.  09/29/2021  Ms. Forino returns today for follow-up.  She reports due to some medical issues she had been delayed in starting her Repatha.  She did however recently start the medication after being seen and evaluated by her PCP and treated for a UTI which may have been causing her myalgias.  She has not had repeat lipid assessment.  06/15/2022  Ms. Swanigan is seen today in follow-up.  Unfortunately she has had side effects on Repatha.  She says she is very sensitive to medications.  She had some myalgia and fatigue and symptoms that lasted almost the 2 weeks in between doses.  Based on that she no longer wants to take the medication.  Options are otherwise fairly limited and she is concerned that  her cholesterol is quite high.  She did have repeat testing off of therapy showing total cholesterol 257 triglycerides 70, HDL 66 and LDL 180.  PMHx:  Past Medical History:  Diagnosis Date   Anxiety    Arthritis    shoulders, hands   Cancer (HCC) 07/2017   right breast cancer   Chest pain    Diverticulosis    GERD (gastroesophageal reflux disease)    High cholesterol    IBS (irritable bowel syndrome)    Kidney disorder    Pt states only has left kidney   Panic attacks    Pulmonary embolism (HCC) 2018   pt states resolved and no longer takes blood thinners    Past Surgical History:  Procedure Laterality Date   BREAST LUMPECTOMY WITH RADIOACTIVE SEED AND SENTINEL LYMPH NODE BIOPSY Right 08/23/2017   Procedure: BREAST LUMPECTOMY WITH RADIOACTIVE SEED AND SENTINEL LYMPH NODE BIOPSY;  Surgeon: Harriette Bouillon, MD;  Location: [facility];  Service: General;  Laterality: Right;   TUBAL LIGATION      FAMHx:  Family History  Problem Relation Age of Onset   Alzheimer's disease Mother    Stroke Father 26   Heart attack Maternal Grandmother    Heart attack Paternal Grandmother    Cervical cancer Cousin    Colon cancer Paternal Aunt    Colon cancer Cousin     SOCHx:   reports that she quit smoking about 39 years ago. Her smoking use  included cigarettes. She has a 20.00 pack-year smoking history. She has never used smokeless tobacco. She reports current alcohol use. She reports that she does not use drugs.  ALLERGIES:  Allergies  Allergen Reactions   Clindamycin Diarrhea and Nausea Only   Venlafaxine Other (See Comments)    Not feeling well   Atorvastatin     Myalgias, GI upset   Rosuvastatin     Myalgias, GI upset    ROS: Pertinent items noted in HPI and remainder of comprehensive ROS otherwise negative.  HOME MEDS: Current Outpatient Medications on File Prior to Visit  Medication Sig Dispense Refill   acetaminophen (TYLENOL) 500 MG tablet Take 1,000 mg  by mouth every 6 (six) hours as needed for mild pain.     ALPRAZolam (XANAX) 0.25 MG tablet 3 (three) times daily. TAKE 1/2 TABLET IN THE MORNING AND 1/2 IN THE EVENING AT BEDTIME     anastrozole (ARIMIDEX) 1 MG tablet Take 1 tablet (1 mg total) by mouth daily. 90 tablet 4   aspirin EC 81 MG tablet Take 81 mg by mouth daily.     cholecalciferol 2000 units tablet Take 2,000 Units by mouth daily.      hydrocortisone cream 1 % Apply 1 application topically as needed for itching.     hyoscyamine (ANASPAZ) 0.125 MG TBDP disintergrating tablet Take 0.125 mg by mouth every 6 (six) hours as needed.  3   omeprazole (PRILOSEC) 20 MG capsule Take 20 mg by mouth daily.     traMADol (ULTRAM) 50 MG tablet as needed.     No current facility-administered medications on file prior to visit.    LABS/IMAGING: No results found for this or any previous visit (from the past 48 hour(s)). No results found.  LIPID PANEL:    Component Value Date/Time   CHOL 257 (H) 05/25/2022 0953   TRIG 70 05/25/2022 0953   HDL 66 05/25/2022 0953   CHOLHDL 3.9 05/25/2022 0953   CHOLHDL 3.9 03/26/2017 0700   VLDL 11 03/26/2017 0700   LDLCALC 180 (H) 05/25/2022 0953    WEIGHTS: Wt Readings from Last 3 Encounters:  06/15/22 160 lb 6.4 oz (72.8 kg)  05/13/22 160 lb (72.6 kg)  12/09/21 164 lb (74.4 kg)    VITALS: BP 138/78   Pulse 78   Ht 5\' 3"  (1.6 m)   Wt 160 lb 6.4 oz (72.8 kg)   SpO2 98%   BMI 28.41 kg/m   EXAM: Deferred  EKG: Deferred  ASSESSMENT: Mixed dyslipidemia, goal LDL less than 70 History of possible TIA History of PE 0 CAC score (03/2021) Statin and Repatha intolerance - myalgias, fatigue, flu-like symptoms  PLAN: 1.   Ms. Lively has had intolerance to Repatha as well as statins.  Options are otherwise fairly limited.  We could consider Zetia although she does have a history of IBS or Nexletol.  This can also have some GI side effects as well.  She is agreeable to trying ezetimibe as well  as dietary changes to see if she can lower the numbers.  If tolerated we will plan to repeat lipid in a few months.  We could then maybe consider adding Nexletol as a combination Nexlizet option.  Chrystie Nose, MD, [facility], FACP  [place]  Medical Director of the Advanced Lipid Disorders &  Cardiovascular Risk Reduction Clinic Diplomate of the American Board of Clinical Lipidology Attending Cardiologist  Direct Dial: [phone]  Fax: [phone]  Website:  [email or web address]  Iantha Fallen  C Finnleigh Marchetti 06/15/2022, 11:06 AM

## 2022-07-12 ENCOUNTER — Encounter: Payer: Self-pay | Admitting: Hematology and Oncology

## 2022-10-02 LAB — LIPID PANEL: HDL: 59 mg/dL (ref >39)

## 2022-10-06 ENCOUNTER — Ambulatory Visit: Payer: Medicare Other | Attending: Internal Medicine | Admitting: Internal Medicine

## 2022-10-06 ENCOUNTER — Encounter: Payer: Self-pay | Admitting: Internal Medicine

## 2022-10-06 VITALS — BP 153/82 | HR 85 | Ht 64.0 in | Wt 166.4 lb

## 2022-10-06 DIAGNOSIS — E785 Hyperlipidemia, unspecified: Secondary | ICD-10-CM | POA: Diagnosis not present

## 2022-10-06 DIAGNOSIS — T466X5D Adverse effect of antihyperlipidemic and antiarteriosclerotic drugs, subsequent encounter: Secondary | ICD-10-CM | POA: Diagnosis not present

## 2022-10-06 DIAGNOSIS — M791 Myalgia, unspecified site: Secondary | ICD-10-CM | POA: Diagnosis not present

## 2022-10-06 DIAGNOSIS — Z8673 Personal history of transient ischemic attack (TIA), and cerebral infarction without residual deficits: Secondary | ICD-10-CM | POA: Diagnosis not present

## 2022-10-06 NOTE — Progress Notes (Signed)
LIPID CLINIC CONSULT NOTE  Chief Complaint:  Follow-up dyslipidemia  Primary Care Physician: Richmond Campbell., PA-C  Primary Cardiologist:  Nanetta Batty, MD  HPI:  Melissa [name] is a 72 y.o. female who is being seen today for the evaluation of dyslipidemia at the request of Arlyce Dice, Kristen W., PA-C. this is a pleasant 72 year old female kindly referred for evaluation management of dyslipidemia.  She reports a longstanding cholesterol disorder but has been intolerant to statins.  In the distant past she was placed on rosuvastatin but could not tolerate it and recently restarted it but had issues with myalgias.  She reports she has not tried any other statins.  Other medical problems include history of pulmonary embolism remotely and then more recently she had possible TIA.  She is scheduled for a TEE to evaluate a possible PFO.  She was noted however to have high cholesterol with recent total cholesterol of 251, triglycerides 101, HDL 61 and LDL 170.  Given her history of TIA, her target LDL cholesterol is less than 70.  She does report some intake of saturated fats.  In addition she reports eating 2 eggs daily.  She did have a recent coronary calcium score which was 0 and is somewhat reassuring.  09/29/2021  Melissa [name] returns today for follow-up.  She reports due to some medical issues she had been delayed in starting her Repatha.  She did however recently start the medication after being seen and evaluated by her PCP and treated for a UTI which may have been causing her myalgias.  She has not had repeat lipid assessment.  06/15/2022  Melissa [name] is seen today in follow-up.  Unfortunately she has had side effects on Repatha.  She says she is very sensitive to medications.  She had some myalgia and fatigue and symptoms that lasted almost the 2 weeks in between doses.  Based on that she no longer wants to take the medication.  Options are otherwise fairly limited and she is concerned that  her cholesterol is quite high.  She did have repeat testing off of therapy showing total cholesterol 257 triglycerides 70, HDL 66 and LDL 180.  10/06/2022  Melissa [name] is seen today in follow-up.  Unfortunately she could not take Zetia as well.  She said she had similar side effects to the Repatha and the statins.  She has had a minimal improvement in her cholesterol which is now total 243, triglycerides 98, HDL 59 and LDL 167.  She says she is under a lot of stress but is adamant about losing weight and improving her triglycerides with diet.  PMHx:  Past Medical History:  Diagnosis Date   Anxiety    Arthritis    shoulders, hands   Cancer (HCC) 07/2017   right breast cancer   Chest pain    Diverticulosis    GERD (gastroesophageal reflux disease)    High cholesterol    IBS (irritable bowel syndrome)    Kidney disorder    Pt states only has left kidney   Panic attacks    Pulmonary embolism (HCC) 2018   pt states resolved and no longer takes blood thinners    Past Surgical History:  Procedure Laterality Date   BREAST LUMPECTOMY WITH RADIOACTIVE SEED AND SENTINEL LYMPH NODE BIOPSY Right 08/23/2017   Procedure: BREAST LUMPECTOMY WITH RADIOACTIVE SEED AND SENTINEL LYMPH NODE BIOPSY;  Surgeon: Harriette Bouillon, MD;  Location: [facility];  Service: General;  Laterality: Right;   TUBAL LIGATION  FAMHx:  Family History  Problem Relation Age of Onset   Alzheimer's disease Mother    Stroke Father 15   Heart attack Maternal Grandmother    Heart attack Paternal Grandmother    Cervical cancer Cousin    Colon cancer Paternal Aunt    Colon cancer Cousin     SOCHx:   reports that she quit smoking about 39 years ago. Her smoking use included cigarettes. She started smoking about 40 years ago. She has a 20 pack-year smoking history. She has never used smokeless tobacco. She reports current alcohol use. She reports that she does not use drugs.  ALLERGIES:  Allergies   Allergen Reactions   Clindamycin Diarrhea and Nausea Only   Venlafaxine Other (See Comments)    Not feeling well   Atorvastatin     Myalgias, GI upset   Repatha [Evolocumab] Other (See Comments)    Myalgias, Fatigue   Rosuvastatin     Myalgias, GI upset    ROS: Pertinent items noted in HPI and remainder of comprehensive ROS otherwise negative.  HOME MEDS: Current Outpatient Medications on File Prior to Visit  Medication Sig Dispense Refill   acetaminophen (TYLENOL) 500 MG tablet Take 1,000 mg by mouth every 6 (six) hours as needed for mild pain.     ALPRAZolam (XANAX) 0.25 MG tablet 3 (three) times daily. TAKE 1/2 TABLET IN THE MORNING AND 1/2 IN THE EVENING AT BEDTIME     anastrozole (ARIMIDEX) 1 MG tablet Take 1 tablet (1 mg total) by mouth daily. 90 tablet 4   aspirin EC 81 MG tablet Take 81 mg by mouth daily.     cholecalciferol 2000 units tablet Take 2,000 Units by mouth daily.      hydrocortisone cream 1 % Apply 1 application topically as needed for itching.     omeprazole (PRILOSEC) 20 MG capsule Take 20 mg by mouth daily.     ezetimibe (ZETIA) 10 MG tablet Take 1 tablet (10 mg total) by mouth daily. 90 tablet 3   hyoscyamine (ANASPAZ) 0.125 MG TBDP disintergrating tablet Take 0.125 mg by mouth every 6 (six) hours as needed. (Patient not taking: Reported on 10/06/2022)  3   traMADol (ULTRAM) 50 MG tablet as needed. (Patient not taking: Reported on 10/06/2022)     No current facility-administered medications on file prior to visit.    LABS/IMAGING: No results found for this or any previous visit (from the past 48 hour(s)). No results found.  LIPID PANEL:    Component Value Date/Time   CHOL 243 (H) 10/01/2022 0820   TRIG 98 10/01/2022 0820   HDL 59 10/01/2022 0820   CHOLHDL 4.1 10/01/2022 0820   CHOLHDL 3.9 03/26/2017 0700   VLDL 11 03/26/2017 0700   LDLCALC 167 (H) 10/01/2022 0820    WEIGHTS: Wt Readings from Last 3 Encounters:  10/06/22 166 lb 6.4 oz (75.5 kg)   06/15/22 160 lb 6.4 oz (72.8 kg)  05/13/22 160 lb (72.6 kg)    VITALS: BP (!) 153/82 (BP Location: Left Arm, Patient Position: Sitting, Cuff Size: Normal)   Pulse 85   Ht 5\' 4"  (1.626 m)   Wt 166 lb 6.4 oz (75.5 kg)   SpO2 98%   BMI 28.56 kg/m   EXAM: Deferred  EKG: Deferred  ASSESSMENT: Mixed dyslipidemia, goal LDL 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   Melissa [name] has  a dwindling number of medication options to treat her cholesterol.  She wants to work on diet at this time.  Will plan repeat lipids in 6 months.  We could feasibly try Nexletol however this is associated with minimal lipid improvement.  I do not think she would qualify for Leqvio and it may be cost prohibitive.  Plan follow-up in 6 months.  Chrystie Nose, MD, [facility], FACP  [place]  [facility]  Medical Director of the Advanced Lipid Disorders &  Cardiovascular Risk Reduction Clinic Diplomate of the American Board of Clinical Lipidology Attending Cardiologist  Direct Dial: [phone]  Fax: [phone]  Website:  [email or web address] Nicely Jude Naclerio 10/06/2022, 8:23 AM

## 2022-10-06 NOTE — Patient Instructions (Signed)
Medication Instructions:  NO CHANGES  *If you need a refill on your cardiac medications before your next appointment, please call your pharmacy*   Lab Work: FASTING lipid panel in 6 months ** complete about a week before next visit  If you have labs (blood work) drawn today and your tests are completely normal, you will receive your results only by: MyChart Message (if you have MyChart) OR A paper copy in the mail If you have any lab test that is abnormal or we need to change your treatment, we will call you to review the results.   Follow-Up: At [facility], you and your health needs are our priority.  As part of our continuing mission to provide you with exceptional heart care, we have created designated Provider Care Teams.  These Care Teams include your primary Cardiologist (physician) and Advanced Practice Providers (APPs -  Physician Assistants and Nurse Practitioners) who all work together to provide you with the care you need, when you need it.  We recommend signing up for the patient portal called "MyChart".  Sign up information is provided on this After Visit Summary.  MyChart is used to connect with patients for Virtual Visits (Telemedicine).  Patients are able to view lab/test results, encounter notes, upcoming appointments, etc.  Non-urgent messages can be sent to your provider as well.   To learn more about what you can do with MyChart, go to [email or web address].    Your next appointment:   6 months with Dr. Rennis Golden

## 2022-10-07 ENCOUNTER — Encounter: Payer: Self-pay | Admitting: Hematology and Oncology

## 2022-12-09 ENCOUNTER — Other Ambulatory Visit: Payer: Self-pay | Admitting: *Deleted

## 2022-12-09 DIAGNOSIS — Z170 Estrogen receptor positive status [ER+]: Secondary | ICD-10-CM

## 2022-12-10 ENCOUNTER — Inpatient Hospital Stay: Payer: Medicare Other | Attending: Hematology and Oncology | Admitting: Hematology and Oncology

## 2022-12-10 ENCOUNTER — Inpatient Hospital Stay: Payer: Medicare Other

## 2022-12-10 VITALS — BP 153/66 | HR 89 | Temp 98.0°F | Resp 16 | Wt 168.3 lb

## 2022-12-10 DIAGNOSIS — Z923 Personal history of irradiation: Secondary | ICD-10-CM | POA: Diagnosis not present

## 2022-12-10 DIAGNOSIS — Z86711 Personal history of pulmonary embolism: Secondary | ICD-10-CM | POA: Diagnosis not present

## 2022-12-10 DIAGNOSIS — Z8049 Family history of malignant neoplasm of other genital organs: Secondary | ICD-10-CM | POA: Insufficient documentation

## 2022-12-10 DIAGNOSIS — Z79899 Other long term (current) drug therapy: Secondary | ICD-10-CM | POA: Diagnosis not present

## 2022-12-10 DIAGNOSIS — Z800 Family history of malignant neoplasm of digestive organs: Secondary | ICD-10-CM | POA: Diagnosis not present

## 2022-12-10 DIAGNOSIS — Z1721 Progesterone receptor positive status: Secondary | ICD-10-CM | POA: Diagnosis not present

## 2022-12-10 DIAGNOSIS — Z79811 Long term (current) use of aromatase inhibitors: Secondary | ICD-10-CM | POA: Insufficient documentation

## 2022-12-10 DIAGNOSIS — Z8249 Family history of ischemic heart disease and other diseases of the circulatory system: Secondary | ICD-10-CM | POA: Diagnosis not present

## 2022-12-10 DIAGNOSIS — Z170 Estrogen receptor positive status [ER+]: Secondary | ICD-10-CM | POA: Insufficient documentation

## 2022-12-10 DIAGNOSIS — Z823 Family history of stroke: Secondary | ICD-10-CM | POA: Insufficient documentation

## 2022-12-10 DIAGNOSIS — Z8701 Personal history of pneumonia (recurrent): Secondary | ICD-10-CM | POA: Insufficient documentation

## 2022-12-10 DIAGNOSIS — E7800 Pure hypercholesterolemia, unspecified: Secondary | ICD-10-CM | POA: Insufficient documentation

## 2022-12-10 DIAGNOSIS — Z818 Family history of other mental and behavioral disorders: Secondary | ICD-10-CM | POA: Diagnosis not present

## 2022-12-10 DIAGNOSIS — R232 Flushing: Secondary | ICD-10-CM | POA: Insufficient documentation

## 2022-12-10 DIAGNOSIS — C50411 Malignant neoplasm of upper-outer quadrant of right female breast: Secondary | ICD-10-CM | POA: Insufficient documentation

## 2022-12-10 DIAGNOSIS — Z87891 Personal history of nicotine dependence: Secondary | ICD-10-CM | POA: Insufficient documentation

## 2022-12-10 DIAGNOSIS — Z881 Allergy status to other antibiotic agents status: Secondary | ICD-10-CM | POA: Insufficient documentation

## 2022-12-10 LAB — CBC WITH DIFFERENTIAL (CANCER CENTER ONLY)
Abs Immature Granulocytes: 0.03 10*3/uL (ref 0.00–0.07)
Basophils Absolute: 0.1 10*3/uL (ref 0.0–0.1)
Basophils Relative: 1 %
Eosinophils Absolute: 0.1 10*3/uL (ref 0.0–0.5)
Eosinophils Relative: 2 %
HCT: 41.5 % (ref 36.0–46.0)
Hemoglobin: 13.6 g/dL (ref 12.0–15.0)
Immature Granulocytes: 0 %
Lymphocytes Relative: 27 %
Lymphs Abs: 2.1 10*3/uL (ref 0.7–4.0)
MCH: 31.9 pg (ref 26.0–34.0)
MCHC: 32.8 g/dL (ref 30.0–36.0)
MCV: 97.4 fL (ref 80.0–100.0)
Monocytes Absolute: 0.7 10*3/uL (ref 0.1–1.0)
Monocytes Relative: 9 %
Neutro Abs: 4.8 10*3/uL (ref 1.7–7.7)
Neutrophils Relative %: 61 %
Platelet Count: 299 10*3/uL (ref 150–400)
RBC: 4.26 MIL/uL (ref 3.87–5.11)
RDW: 12.7 % (ref 11.5–15.5)
WBC Count: 7.8 10*3/uL (ref 4.0–10.5)
nRBC: 0 % (ref 0.0–0.2)

## 2022-12-10 LAB — CMP (CANCER CENTER ONLY)
ALT: 14 U/L (ref 0–44)
AST: 18 U/L (ref 15–41)
Albumin: 4.3 g/dL (ref 3.5–5.0)
Alkaline Phosphatase: 66 U/L (ref 38–126)
Anion gap: 7 (ref 5–15)
BUN: 16 mg/dL (ref 8–23)
CO2: 30 mmol/L (ref 22–32)
Calcium: 10.2 mg/dL (ref 8.9–10.3)
Chloride: 103 mmol/L (ref 98–111)
Creatinine: 0.73 mg/dL (ref 0.44–1.00)
GFR, Estimated: >60 mL/min (ref >60)
Glucose, Bld: 102 mg/dL — ABNORMAL HIGH (ref 70–99)
Potassium: 4.2 mmol/L (ref 3.5–5.1)
Sodium: 140 mmol/L (ref 135–145)
Total Bilirubin: 0.5 mg/dL (ref <1.2)
Total Protein: 7.5 g/dL (ref 6.5–8.1)

## 2022-12-10 NOTE — Progress Notes (Signed)
[facility]  [phone] [phone]     ID: Melissa [name] DOB: [birth year]  MR#: [id number]  HYQ#:[id number]  Patient Care Team: Richmond Campbell., PA-C as PCP - General (Family Medicine) Runell Gess, MD as PCP - Cardiology (Cardiology) Harriette Bouillon, MD as Consulting Physician (General Surgery) Dorothy Puffer, MD as Consulting Physician (Radiation Oncology) [name], MD as Consulting Physician (Family Medicine) Richmond Campbell., PA-C as Physician Assistant (Family Medicine) Sharrell Ku, MD as Consulting Physician (Gastroenterology) Axel Filler, Larna Daughters, NP as Nurse Practitioner (Hematology and Oncology) Rachel Moulds, MD as Medical Oncologist (Hematology and Oncology)  CHIEF COMPLAINT: Estrogen receptor positive breast cancer  CURRENT TREATMENT: Anastrozole; alendronate  INTERVAL HISTORY:  Melissa [name] returns today for follow up of her estrogen receptor positive breast cancer.   She continues on anastrozole.  She is glad to be done with 5 years of antiestrogen therapy this month.  She is tolerating anastrozole overall well except for minor hot flashes and vaginal dryness.  She was worried about some lung nodules that were noted back on a CT in 2023 but was never made aware of.  She denies any cough, chest pain or shortness of breath.    Mammogram and ultrasound from June 2024 without any concerns. COVID 19 VACCINATION STATUS: fully vaccinated [employer]), status post 1 booster  HISTORY OF CURRENT ILLNESS: From the original intake note:  Melissa [name] had routine screening bilateral mammography with tomography at [facility] on 07/12/2017 showing; breast density category C. There was a possible mass in the right breast. On 07/14/2017 she completed unilateral right ultrasonography at [facility] that showed an irregular hypoechoic 1.6 cm mass in the right upper outer quadrant and posterior depth. Evaluation of the right axilla showed 4 lymph nodes  with cortical thickening suspicious of malignancy.   Accordingly on 07/19/2017 she proceeded to biopsy of the right breast area and 1 lymph node in question. 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  The lymph node was biopsied showing no evidence of carcinoma, which was found to be concordant.  Prognostic indicators significant for: estrogen receptor, 95% positive and progesterone receptor, 95% positive, both with strong staining intensity. Proliferation marker Ki67 at 10% . HER2 not amplified with ratios HER2/CEP17 signals 1.22 and average HER2 copies per cell 1.95  The patient's subsequent history is as detailed below.   PAST MEDICAL HISTORY: Past Medical History:  Diagnosis Date   Anxiety    Arthritis    shoulders, hands   Cancer (HCC) 07/2017   right breast cancer   Chest pain    Diverticulosis    GERD (gastroesophageal reflux disease)    High cholesterol    IBS (irritable bowel syndrome)    Kidney disorder    Pt states only has left kidney   Panic attacks    Pulmonary embolism (HCC) 2018   pt states resolved and no longer takes blood thinners  Small pulmonary emboli - eliquis for 6 months- resolved.  Depressive and anxiety moods - xanax One functioning Kidney   PAST SURGICAL HISTORY: Past Surgical History:  Procedure Laterality Date   BREAST LUMPECTOMY WITH RADIOACTIVE SEED AND SENTINEL LYMPH NODE BIOPSY Right 08/23/2017   Procedure: BREAST LUMPECTOMY WITH RADIOACTIVE SEED AND SENTINEL LYMPH NODE BIOPSY;  Surgeon: Harriette Bouillon, MD;  Location: [facility];  Service: General;  Laterality: Right;   TUBAL LIGATION      FAMILY HISTORY Family History  Problem Relation Age of Onset   Alzheimer's disease Mother    Stroke Father 83   Heart attack Maternal Grandmother    Heart attack Paternal Grandmother    Cervical cancer Cousin    Colon cancer Paternal Aunt     Colon cancer Cousin   The patient's father died at age 63 due to CHF. The patient's mother died at age 75 due to Alzheimer's. The patient has 1 brother and 4 sisters. There was a paternal 1st cousin diagnosed with cervical cancer at age 46. There was a paternal aunt diagnosed with colon cancer at age 36. There was a paternal 1st cousin diagnosed with colon cancer at age 86. The patient denies a family history of breast or ovarian cancer.     GYNECOLOGIC HISTORY:  No LMP recorded. Patient is postmenopausal. Menarche: 72 years old Age at first live birth: 72 years old She is GX P1. Her LMP was in 2008. She used oral contraception with no complications. She did not used HRT.     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 Her husband, Renae Fickle, died in November 19, 2018due to pancreatic cancer. He was a Chartered certified accountant. She has 42 year old dog, a Research officer, trade union. The patient's daughter, [name] lives in [place] and works at [employer]. Melissa [name] has two blood grandchildren and two step-grandchildren.   ADVANCED DIRECTIVES: The patient plans to name her daughter, Layla Barter as her HCPOA. The appropriate forms were given at the 07/27/2017 visit.    HEALTH MAINTENANCE: Social History   Tobacco Use   Smoking status: Former    Current packs/day: 0.00    Average packs/day: 20.0 packs/day for 1 year (20.0 ttl pk-yrs)    Types: Cigarettes    Start date: 84    Quit date: 1985    Years since quitting: 39.8   Smokeless tobacco: Never  Substance Use Topics   Alcohol use: Yes    Comment: social   Drug use: Never    Colonoscopy: Feb. 2019/ [facility]/ diverticulosis  PAP: Nov. 2018  Bone density: Nov. 2018 / osteopenia   Allergies  Allergen Reactions   Clindamycin Diarrhea and Nausea Only   Venlafaxine Other (See Comments)    Not feeling well   Atorvastatin     Myalgias, GI upset   Repatha [Evolocumab] Other (See Comments)    Myalgias, Fatigue    Rosuvastatin     Myalgias, GI upset   Zetia [Ezetimibe] Other (See Comments)    Muscle ache    Current Outpatient Medications  Medication Sig Dispense Refill   acetaminophen (TYLENOL) 500 MG tablet Take 1,000 mg by mouth every 6 (six) hours as needed for mild pain.     ALPRAZolam (XANAX) 0.25 MG tablet 3 (three) times daily. TAKE 1/2 TABLET IN THE MORNING AND 1/2 IN THE EVENING AT BEDTIME     anastrozole (ARIMIDEX) 1 MG tablet Take 1 tablet (1 mg total) by mouth daily. 90 tablet 4   aspirin EC 81 MG tablet Take 81 mg by mouth daily.     cholecalciferol 2000 units tablet Take 2,000 Units by mouth daily.      hydrocortisone cream 1 % Apply 1 application topically as needed for itching.     hyoscyamine (ANASPAZ) 0.125 MG TBDP disintergrating tablet Take 0.125 mg by mouth every 6 (six) hours as needed. (Patient not taking: Reported on 10/06/2022)  3   omeprazole (PRILOSEC) 20 MG capsule Take 20 mg by mouth daily.  traMADol (ULTRAM) 50 MG tablet as needed. (Patient not taking: Reported on 10/06/2022)     No current facility-administered medications for this visit.    OBJECTIVE:  white woman who appears younger than stated age  Vitals:   12/10/22 0949  BP: (!) 153/66  Pulse: 89  Resp: 16  Temp: 98 F (36.7 C)  SpO2: 98%      Body mass index is 28.89 kg/m.   Wt Readings from Last 3 Encounters:  12/10/22 168 lb 4.8 oz (76.3 kg)  10/06/22 166 lb 6.4 oz (75.5 kg)  06/15/22 160 lb 6.4 oz (72.8 kg)     ECOG FS:1 - Symptomatic but completely ambulatory   Physical Exam Constitutional:      Appearance: Normal appearance.  Cardiovascular:     Rate and Rhythm: Normal rate and regular rhythm.     Pulses: Normal pulses.     Heart sounds: Normal heart sounds.  Chest:     Comments: Bilateral breasts inspected.  No palpable masses or regional adenopathy Musculoskeletal:     Cervical back: Normal range of motion. No rigidity.  Lymphadenopathy:     Cervical: No cervical adenopathy.   Neurological:     Mental Status: She is alert.      LAB RESULTS:  CMP     Component Value Date/Time   NA 140 12/10/2022 0854   K 4.2 12/10/2022 0854   CL 103 12/10/2022 0854   CO2 30 12/10/2022 0854   GLUCOSE 102 (H) 12/10/2022 0854   BUN 16 12/10/2022 0854   CREATININE 0.73 12/10/2022 0854   CALCIUM 10.2 12/10/2022 0854   PROT 7.5 12/10/2022 0854   PROT 7.4 06/06/2020 1010   ALBUMIN 4.3 12/10/2022 0854   ALBUMIN 4.7 06/06/2020 1010   AST 18 12/10/2022 0854   ALT 14 12/10/2022 0854   ALKPHOS 66 12/10/2022 0854   BILITOT 0.5 12/10/2022 0854   GFRNONAA >60 12/10/2022 0854   GFRAA >60 12/04/2018 1337   GFRAA >60 08/02/2018 0904    Lab Results  Component Value Date   WBC 7.8 12/10/2022   NEUTROABS 4.8 12/10/2022   HGB 13.6 12/10/2022   HCT 41.5 12/10/2022   MCV 97.4 12/10/2022   PLT 299 12/10/2022   No results found for: "LABCA2"  No components found for: "VOZDGU440"  No results for input(s): "INR" in the last 168 hours.  No results found for: "LABCA2"  No results found for: "HKV425"  No results found for: "CAN125"  No results found for: "CAN153"  No results found for: "CA2729"  No components found for: "HGQUANT"  No results found for: "CEA1", "CEA" / No results found for: "CEA1", "CEA"   No results found for: "AFPTUMOR"  No results found for: "CHROMOGRNA"  No results found for: "TOTALPROTELP", "ALBUMINELP", "A1GS", "A2GS", "BETS", "BETA2SER", "GAMS", "MSPIKE", "SPEI" (this displays SPEP labs)  No results found for: "KPAFRELGTCHN", "LAMBDASER", "KAPLAMBRATIO" (kappa/lambda light chains)  No results found for: "HGBA", "HGBA2QUANT", "HGBFQUANT", "HGBSQUAN" (Hemoglobinopathy evaluation)   No results found for: "LDH"  No results found for: "IRON", "TIBC", "IRONPCTSAT" (Iron and TIBC)  No results found for: "FERRITIN"  Urinalysis    Component Value Date/Time   COLORURINE STRAW (A) 11/10/2018 1242   APPEARANCEUR CLEAR 11/10/2018 1242    LABSPEC <1.005 (L) 11/10/2018 1242   PHURINE 7.0 11/10/2018 1242   GLUCOSEU NEGATIVE 11/10/2018 1242   HGBUR TRACE (A) 11/10/2018 1242   BILIRUBINUR NEGATIVE 11/10/2018 1242   KETONESUR NEGATIVE 11/10/2018 1242   PROTEINUR NEGATIVE 11/10/2018 1242  NITRITE NEGATIVE 11/10/2018 1242   LEUKOCYTESUR NEGATIVE 11/10/2018 1242    STUDIES: No results found.   ELIGIBLE FOR AVAILABLE RESEARCH PROTOCOL: no   ASSESSMENT: 72 y.o. Melissa [name], Melissa [name] woman status post right breast upper outer quadrant biopsy 07/19/2017 for a clinical T1c NX, stage IA invasive ductal carcinoma, grade 2, estrogen and progesterone receptor positive, HER-2 not amplified, with an MIB-1 of 10%  (a) 1 of 4 suspicious axillary lymph nodes biopsied 07/19/2017 was negative  (1) status post right lumpectomy and sentinel lymph node sampling 08/23/2017 for a pT1c pN0, stage IA invasive ductal carcinoma, grade 2, with negative margins  (a) total of 5 sentinel lymph nodes removed  (2) The Oncotype DX score was 12, predicting a risk of outside the breast recurrence over the next 9 years of 3% if the patient's only systemic therapy is tamoxifen for 5 years.  It also predicts no significant benefit from chemotherapy.  (3) adjuvant radiation completed 09/27/2017 - 10/24/2017  Site/dose: The patient initially received a dose of 42.56 Gy in 16 fractions to the breast using whole-breast tangent fields. This was delivered using a 3-D conformal technique. The patient then received a boost to the seroma. This delivered an additional 8 Gy in 4 fractions using a 3 field photon technique due to the depth of the seroma. The total dose was 50.56 Gy.  (4) anastrozole started 11/25/2017  (a) density 05/10/2016 showed a T score of -1.9  (b) repeat DEXA scan 06/29/2018 showed a T score of -2.3  (c) alendronate/Fosamax started 08/02/2018  (5) history of acute right lower lobe pulmonary embolus 03/19/2016; bilateral Dopplers lower extremity same day were  clear  (a) status post apixaban x6 months   PLAN:  Breast cancer follow-up Patient completed 5 years of Anastrozole. Mammogram and ultrasound were normal. -Discontinue Anastrozole. -Continue annual mammograms.  Lung nodules Patient has stable lung nodules on the left side, unchanged since 2020. Patient has a history of pneumonia and is a remote smoker, quit back in 1985.  She has no clinical symptoms.  I do not believe these nodules warrant further investigation and patient is comfortable with this plan.  I have however asked her to keep [place] posted if she wishes [place] to do another imaging. -No further imaging needed at this time.  FU in 1 yr.  Total encounter time 30 minutes.*  *Total Encounter Time as defined by the Centers for Medicare and Medicaid Services includes, in addition to the face-to-face time of a patient visit (documented in the note above) non-face-to-face time: obtaining and reviewing outside history, ordering and reviewing medications, tests or procedures, care coordination (communications with other health care professionals or caregivers) and documentation in the medical record.

## 2023-04-06 ENCOUNTER — Encounter (HOSPITAL_BASED_OUTPATIENT_CLINIC_OR_DEPARTMENT_OTHER): Payer: Medicare Other | Admitting: Internal Medicine

## 2023-07-12 ENCOUNTER — Encounter (HOSPITAL_BASED_OUTPATIENT_CLINIC_OR_DEPARTMENT_OTHER): Payer: Self-pay | Admitting: Internal Medicine

## 2023-07-13 ENCOUNTER — Encounter: Payer: Self-pay | Admitting: Hematology and Oncology

## 2023-07-14 ENCOUNTER — Encounter (HOSPITAL_BASED_OUTPATIENT_CLINIC_OR_DEPARTMENT_OTHER): Payer: Self-pay | Admitting: *Deleted

## 2023-07-18 ENCOUNTER — Other Ambulatory Visit (HOSPITAL_COMMUNITY): Payer: Self-pay | Admitting: Family Medicine

## 2023-07-19 ENCOUNTER — Ambulatory Visit: Payer: Self-pay | Admitting: Internal Medicine

## 2023-07-19 LAB — LIPID PANEL
Chol/HDL Ratio: 4.4 ratio (ref 0.0–4.4)
Cholesterol, Total: 253 mg/dL — ABNORMAL HIGH (ref 100–199)
HDL: 58 mg/dL (ref >39)
LDL Chol Calc (NIH): 175 mg/dL — ABNORMAL HIGH (ref 0–99)
Triglycerides: 111 mg/dL (ref 0–149)
VLDL Cholesterol Cal: 20 mg/dL (ref 5–40)

## 2023-07-20 ENCOUNTER — Encounter (HOSPITAL_BASED_OUTPATIENT_CLINIC_OR_DEPARTMENT_OTHER): Payer: Self-pay | Admitting: Internal Medicine

## 2023-07-20 ENCOUNTER — Telehealth: Payer: Self-pay | Admitting: Pharmacy Technician

## 2023-07-20 ENCOUNTER — Other Ambulatory Visit (HOSPITAL_COMMUNITY): Payer: Self-pay

## 2023-07-20 ENCOUNTER — Ambulatory Visit (HOSPITAL_BASED_OUTPATIENT_CLINIC_OR_DEPARTMENT_OTHER): Admitting: Internal Medicine

## 2023-07-20 VITALS — BP 132/64 | HR 78 | Ht 63.0 in | Wt 168.1 lb

## 2023-07-20 DIAGNOSIS — E785 Hyperlipidemia, unspecified: Secondary | ICD-10-CM

## 2023-07-20 DIAGNOSIS — M791 Myalgia, unspecified site: Secondary | ICD-10-CM

## 2023-07-20 DIAGNOSIS — Z8673 Personal history of transient ischemic attack (TIA), and cerebral infarction without residual deficits: Secondary | ICD-10-CM

## 2023-07-20 DIAGNOSIS — Z789 Other specified health status: Secondary | ICD-10-CM

## 2023-07-20 DIAGNOSIS — T466X5D Adverse effect of antihyperlipidemic and antiarteriosclerotic drugs, subsequent encounter: Secondary | ICD-10-CM

## 2023-07-20 NOTE — Telephone Encounter (Signed)
 Patient Advocate Encounter   The patient was approved for a [employer] grant that will help cover the cost of praluent Total amount awarded, 2500.  Effective: 06/20/23 - 06/18/24   APW:[id number] ERW:[id number] Hmnle:[id number] PI:[id number] [employer] ID: [id number]   Pharmacy provided with approval and processing information. Patient informed via telephone       Lmom for pt to see if she wants it at our [place] pharmacy. Information in wam. And sent her a mychart

## 2023-07-20 NOTE — Telephone Encounter (Addendum)
 Pharmacy Patient Advocate Encounter   Received notification from staff messages-jocabed that prior authorization for praluent 75mg  is required/requested.   Insurance verification completed.   The patient is insured through [facility] .   Per test claim: PA required; PA submitted to above mentioned insurance via CoverMyMeds Key/confirmation #/EOC [facility] Status is pending

## 2023-07-20 NOTE — Progress Notes (Signed)
 LIPID CLINIC CONSULT NOTE  Chief Complaint:  Follow-up dyslipidemia  Primary Care Physician: Debrah Josette ORN., PA-C  Primary Cardiologist:  Dorn Lesches, MD  HPI:  Melissa [name] is a 73 y.o. female who is being seen today for the evaluation of dyslipidemia at the request of Debrah, Kristen W., PA-C. this is a pleasant 73 year old female kindly referred for evaluation management of dyslipidemia.  She reports a longstanding cholesterol disorder but has been intolerant to statins.  In the distant past she was placed on rosuvastatin  but could not tolerate it and recently restarted it but had issues with myalgias.  She reports she has not tried any other statins.  Other medical problems include history of pulmonary embolism remotely and then more recently she had possible TIA.  She is scheduled for a TEE to evaluate a possible PFO.  She was noted however to have high cholesterol with recent total cholesterol of 251, triglycerides 101, HDL 61 and LDL 170.  Given her history of TIA, her target LDL cholesterol is less than 70.  She does report some intake of saturated fats.  In addition she reports eating 2 eggs daily.  She did have a recent coronary calcium  score which was 0 and is somewhat reassuring.  09/29/2021  Ms. Emerick returns today for follow-up.  She reports due to some medical issues she had been delayed in starting her Repatha .  She did however recently start the medication after being seen and evaluated by her PCP and treated for a UTI which may have been causing her myalgias.  She has not had repeat lipid assessment.  06/15/2022  Ms. Loflin is seen today in follow-up.  Unfortunately she has had side effects on Repatha .  She says she is very sensitive to medications.  She had some myalgia and fatigue and symptoms that lasted almost the 2 weeks in between doses.  Based on that she no longer wants to take the medication.  Options are otherwise fairly limited and she is concerned that  her cholesterol is quite high.  She did have repeat testing off of therapy showing total cholesterol 257 triglycerides 70, HDL 66 and LDL 180.  10/06/2022  Ms. Justiss is seen today in follow-up.  Unfortunately she could not take Zetia  as well.  She said she had similar side effects to the Repatha  and the statins.  She has had a minimal improvement in her cholesterol which is now total 243, triglycerides 98, HDL 59 and LDL 167.  She says she is under a lot of stress but is adamant about losing weight and improving her triglycerides with diet.  07/20/2023  Ms. Mcnicholas is seen today in follow-up.  When we last saw show though she was good to work on diet and lifestyle to help further lower cholesterol however she has been under a lot of stress.  Her lipids are still poorly controlled with total cholesterol 253, triglycerides 111, HDL 58 and LDL 175.  She has a number of intolerances and is really limited our options for therapy.  I considered both Nexletol and Leqvio's options she has not tried but she has concerns about side effects with both medications.  She did indicate she was willing to consider trying Repatha  again although she said she had a lot of fatigue and myalgias with that.  She says her fianc is taking it but is doing well with it.  We did discuss the possibility of Praluent as an alternative perhaps even the lower dose 75 mg.  Although it is a similar medication to Repatha  it may have different side effect profile.  PMHx:  Past Medical History:  Diagnosis Date   Anxiety    Arthritis    shoulders, hands   Cancer (HCC) 07/2017   right breast cancer   Chest pain    Diverticulosis    GERD (gastroesophageal reflux disease)    High cholesterol    IBS (irritable bowel syndrome)    Kidney disorder    Pt states only has left kidney   Panic attacks    Pulmonary embolism (HCC) 2018   pt states resolved and no longer takes blood thinners    Past Surgical History:  Procedure Laterality  Date   BREAST LUMPECTOMY WITH RADIOACTIVE SEED AND SENTINEL LYMPH NODE BIOPSY Right 08/23/2017   Procedure: BREAST LUMPECTOMY WITH RADIOACTIVE SEED AND SENTINEL LYMPH NODE BIOPSY;  Surgeon: Vanderbilt Ned, MD;  Location: [facility];  Service: General;  Laterality: Right;   TUBAL LIGATION      FAMHx:  Family History  Problem Relation Age of Onset   Alzheimer's disease Mother    Stroke Father 57   Heart attack Maternal Grandmother    Heart attack Paternal Grandmother    Cervical cancer Cousin    Colon cancer Paternal Aunt    Colon cancer Cousin     SOCHx:   reports that she quit smoking about 40 years ago. Her smoking use included cigarettes. She started smoking about 41 years ago. She has a 20 pack-year smoking history. She has never used smokeless tobacco. She reports current alcohol use. She reports that she does not use drugs.  ALLERGIES:  Allergies  Allergen Reactions   Clindamycin Diarrhea and Nausea Only   Venlafaxine  Other (See Comments)    Not feeling well   Atorvastatin      Myalgias, GI upset   Rosuvastatin      Myalgias, GI upset   Tramadol Nausea Only   Zetia  [Ezetimibe ] Other (See Comments)    Muscle ache   Evolocumab  Other (See Comments)    Myalgias, Fatigue    ROS: Pertinent items noted in HPI and remainder of comprehensive ROS otherwise negative.  HOME MEDS: Current Outpatient Medications on File Prior to Visit  Medication Sig Dispense Refill   acetaminophen  (TYLENOL ) 500 MG tablet Take 1,000 mg by mouth every 6 (six) hours as needed for mild pain.     ALPRAZolam (XANAX) 0.25 MG tablet 3 (three) times daily. TAKE 1/2 TABLET IN THE MORNING AND 1/2 IN THE EVENING AT BEDTIME (Patient taking differently: as needed. TAKE 1/2 TABLET IN THE MORNING AND 1/2 IN THE EVENING AT BEDTIME)     aspirin  EC 81 MG tablet Take 81 mg by mouth daily.     cholecalciferol 2000 units tablet Take 2,000 Units by mouth daily.      hydrocortisone cream 1 % Apply 1  application topically as needed for itching.     omeprazole (PRILOSEC) 20 MG capsule Take 20 mg by mouth daily.     anastrozole  (ARIMIDEX ) 1 MG tablet Take 1 tablet (1 mg total) by mouth daily. (Patient not taking: Reported on 07/20/2023) 90 tablet 4   No current facility-administered medications on file prior to visit.    LABS/IMAGING: No results found for this or any previous visit (from the past 48 hours). No results found.  LIPID PANEL:    Component Value Date/Time   CHOL 253 (H) 07/18/2023 0829   TRIG 111 07/18/2023 0829   HDL 58 07/18/2023 0829  CHOLHDL 4.4 07/18/2023 0829   CHOLHDL 3.9 03/26/2017 0700   VLDL 11 03/26/2017 0700   LDLCALC 175 (H) 07/18/2023 0829    WEIGHTS: Wt Readings from Last 3 Encounters:  07/20/23 168 lb 1.6 oz (76.2 kg)  12/10/22 168 lb 4.8 oz (76.3 kg)  10/06/22 166 lb 6.4 oz (75.5 kg)    VITALS: BP 132/64 (BP Location: Left Arm, Patient Position: Sitting, Cuff Size: Normal)   Pulse 78   Ht 5' 3 (1.6 m)   Wt 168 lb 1.6 oz (76.2 kg)   SpO2 98%   BMI 29.78 kg/m   EXAM: Deferred  EKG: Deferred  ASSESSMENT: Mixed dyslipidemia, goal LDL 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   Ms. Beilfuss has persistently elevated cholesterol with LDL 175 as well above target.  Previously her best LDL was around 99 on Repatha .  She could not tolerate that.  She cannot tolerate statins or Zetia  and is not interested in [place].  We talked about a PCSK9 inhibitor antibody options such as Praluent.  All of this is very similar to Repatha  may be somewhat different and we could consider trying the lower dose to start.  She was interested in this and will reach out for prior authorization for the 75 mg every 2-week dose.  If tolerated we will plan repeat lipids in about 3 months or so follow-up afterwards.  Vinie KYM Maxcy, MD, [facility], FNLA, FACP  [place]  [facility]  Medical Director of the Advanced Lipid Disorders &  Cardiovascular Risk Reduction Clinic Diplomate of the American Board of Clinical Lipidology Attending Cardiologist  Direct Dial: [phone]  Fax: [phone]  Website:  [email or web address]   Vinie BROCKS Nikayla Madaris 07/20/2023, 2:23 PM

## 2023-07-20 NOTE — Telephone Encounter (Signed)
 Pharmacy Patient Advocate Encounter  Received notification from [facility] that Prior Authorization for praluent 75mg  has been APPROVED from 07/20/23 to 01/25/24. Ran test claim, Copay is $335.60. This test claim was processed through [facility]- copay amounts may vary at other pharmacies due to pharmacy/plan contracts, or as the patient moves through the different stages of their insurance plan.   I got the patient a healthwell grant. Now it is 0.00. I called and asked if she wanted to get it at one of our pharmacies and I had to leave her a message. The healthwell grant is in our [place] pharmacy system. Waiting to see where she wants rx

## 2023-07-20 NOTE — Patient Instructions (Addendum)
 Medication Instructions:  START Praluent 75 mg daily- initiating prior authorization process  *If you need a refill on your cardiac medications before your next appointment, please call your pharmacy*  Lab Work: FASTING Lipid Panel in 3-4 months (Sept-Oct) If you have labs (blood work) drawn today and your tests are completely normal, you will receive your results only by: MyChart Message (if you have MyChart) OR A paper copy in the mail If you have any lab test that is abnormal or we need to change your treatment, we will call you to review the results.  Your next appointment:   With Dr. Mona- pending lab results

## 2023-07-21 ENCOUNTER — Other Ambulatory Visit (HOSPITAL_BASED_OUTPATIENT_CLINIC_OR_DEPARTMENT_OTHER): Payer: Self-pay

## 2023-07-21 ENCOUNTER — Other Ambulatory Visit (HOSPITAL_COMMUNITY): Payer: Self-pay

## 2023-07-21 MED ORDER — PRALUENT 75 MG/ML ~~LOC~~ SOAJ
75.0000 mg | SUBCUTANEOUS | 2 refills | Status: DC
  Filled 2023-07-21: qty 2, 28d supply, fill #0
  Filled 2023-08-19 (×2): qty 2, 28d supply, fill #1
  Filled 2023-09-14: qty 2, 28d supply, fill #2

## 2023-07-21 NOTE — Telephone Encounter (Signed)
 Hi, the patient called back and would like her Praluent prescription sent to the [facility] pharmacy for pickup please. Can someone please send her prescription there? Thank you!

## 2023-07-21 NOTE — Telephone Encounter (Signed)
 SABRA

## 2023-07-21 NOTE — Telephone Encounter (Signed)
 Rx ready. Sent mychart

## 2023-07-21 NOTE — Telephone Encounter (Signed)
 Praluent has been sent to [facility] pharmacy.

## 2023-07-21 NOTE — Addendum Note (Signed)
 Addended by: RENAY DALTON HERO on: 07/21/2023 08:27 AM   Modules accepted: Orders

## 2023-08-19 ENCOUNTER — Other Ambulatory Visit: Payer: Self-pay

## 2023-08-19 ENCOUNTER — Other Ambulatory Visit (HOSPITAL_BASED_OUTPATIENT_CLINIC_OR_DEPARTMENT_OTHER): Payer: Self-pay

## 2023-08-22 ENCOUNTER — Other Ambulatory Visit (HOSPITAL_BASED_OUTPATIENT_CLINIC_OR_DEPARTMENT_OTHER): Payer: Self-pay

## 2023-09-14 ENCOUNTER — Other Ambulatory Visit (HOSPITAL_BASED_OUTPATIENT_CLINIC_OR_DEPARTMENT_OTHER): Payer: Self-pay

## 2023-10-10 ENCOUNTER — Encounter (HOSPITAL_BASED_OUTPATIENT_CLINIC_OR_DEPARTMENT_OTHER): Payer: Self-pay | Admitting: Internal Medicine

## 2023-10-10 ENCOUNTER — Other Ambulatory Visit (HOSPITAL_BASED_OUTPATIENT_CLINIC_OR_DEPARTMENT_OTHER): Payer: Self-pay

## 2023-10-10 DIAGNOSIS — E785 Hyperlipidemia, unspecified: Secondary | ICD-10-CM

## 2023-10-10 MED ORDER — PRALUENT 75 MG/ML ~~LOC~~ SOAJ
75.0000 mg | SUBCUTANEOUS | 2 refills | Status: DC
  Filled 2023-10-10: qty 2, 28d supply, fill #0
  Filled 2023-11-09 (×2): qty 2, 28d supply, fill #1
  Filled 2023-12-02: qty 2, 28d supply, fill #2

## 2023-10-11 ENCOUNTER — Other Ambulatory Visit (HOSPITAL_BASED_OUTPATIENT_CLINIC_OR_DEPARTMENT_OTHER): Payer: Self-pay

## 2023-11-09 ENCOUNTER — Other Ambulatory Visit (HOSPITAL_BASED_OUTPATIENT_CLINIC_OR_DEPARTMENT_OTHER): Payer: Self-pay

## 2023-11-14 ENCOUNTER — Telehealth: Payer: Self-pay | Admitting: Hematology and Oncology

## 2023-11-14 NOTE — Telephone Encounter (Signed)
 Left voicemail for patient regarding rescheduled appointment from 12/12/2023 to 12/14/2023.

## 2023-11-22 LAB — LIPID PANEL
Chol/HDL Ratio: 3.1 ratio (ref 0.0–4.4)
Cholesterol, Total: 187 mg/dL (ref 100–199)
HDL: 60 mg/dL (ref >39)
LDL Chol Calc (NIH): 108 mg/dL — ABNORMAL HIGH (ref 0–99)
Triglycerides: 104 mg/dL (ref 0–149)
VLDL Cholesterol Cal: 19 mg/dL (ref 5–40)

## 2023-11-24 ENCOUNTER — Other Ambulatory Visit (HOSPITAL_BASED_OUTPATIENT_CLINIC_OR_DEPARTMENT_OTHER): Payer: Self-pay

## 2023-11-24 MED ORDER — FLUZONE HIGH-DOSE 0.5 ML IM SUSY
0.5000 mL | PREFILLED_SYRINGE | Freq: Once | INTRAMUSCULAR | 0 refills | Status: AC
  Filled 2023-11-24: qty 0.5, 1d supply, fill #0

## 2023-11-24 MED ORDER — COMIRNATY 30 MCG/0.3ML IM SUSY
0.3000 mL | PREFILLED_SYRINGE | Freq: Once | INTRAMUSCULAR | 0 refills | Status: AC
  Filled 2023-11-24: qty 0.3, 1d supply, fill #0

## 2023-11-29 ENCOUNTER — Ambulatory Visit: Payer: Self-pay | Admitting: Internal Medicine

## 2023-12-09 ENCOUNTER — Ambulatory Visit: Admitting: Podiatry

## 2023-12-09 ENCOUNTER — Other Ambulatory Visit: Payer: Self-pay | Admitting: Podiatry

## 2023-12-09 ENCOUNTER — Ambulatory Visit

## 2023-12-09 DIAGNOSIS — M21619 Bunion of unspecified foot: Secondary | ICD-10-CM

## 2023-12-09 DIAGNOSIS — M21611 Bunion of right foot: Secondary | ICD-10-CM

## 2023-12-09 DIAGNOSIS — Z01818 Encounter for other preprocedural examination: Secondary | ICD-10-CM

## 2023-12-09 NOTE — Progress Notes (Signed)
 Subjective:  Patient ID: Melissa [name], female    DOB: 07-18-1950,  MRN: [id number]  Chief Complaint  Patient presents with   Bunions    Right foot bunion    73 y.o. female presents with the above complaint.  Patient presents with complaint right bunion deformity.  She states that her bunion has continued bothersome.  Hurts with ambulation or shoe pressure she has not seen anyone as prior to seeing me denies any other acute complaints.  She would like to discuss treatment options for this pain scale 7 out of 10 dull aching nature.  She has tried shoe gear modification padding protecting offloading none of which has helped.  She would like to discuss treatment options for this.  Review of Systems: Negative except as noted in the HPI. Denies N/V/F/Ch.  Past Medical History:  Diagnosis Date   Anxiety    Arthritis    shoulders, hands   Cancer (HCC) 07/2017   right breast cancer   Chest pain    Diverticulosis    GERD (gastroesophageal reflux disease)    High cholesterol    IBS (irritable bowel syndrome)    Kidney disorder    Pt states only has left kidney   Panic attacks    Pulmonary embolism (HCC) 2018   pt states resolved and no longer takes blood thinners    Current Outpatient Medications:    acetaminophen  (TYLENOL ) 500 MG tablet, Take 1,000 mg by mouth every 6 (six) hours as needed for mild pain., Disp: , Rfl:    Alirocumab  (PRALUENT ) 75 MG/ML SOAJ, Inject 1 mL (75 mg total) into the skin every 14 (fourteen) days., Disp: 2 mL, Rfl: 2   ALPRAZolam (XANAX) 0.25 MG tablet, 3 (three) times daily. TAKE 1/2 TABLET IN THE MORNING AND 1/2 IN THE EVENING AT BEDTIME (Patient taking differently: as needed. TAKE 1/2 TABLET IN THE MORNING AND 1/2 IN THE EVENING AT BEDTIME), Disp: , Rfl:    aspirin  EC 81 MG tablet, Take 81 mg by mouth daily., Disp: , Rfl:    cholecalciferol 2000 units tablet, Take 2,000 Units by mouth daily. , Disp: , Rfl:    hydrocortisone cream 1 %, Apply 1 application  topically as needed for itching., Disp: , Rfl:    omeprazole (PRILOSEC) 20 MG capsule, Take 20 mg by mouth daily., Disp: , Rfl:   Social History   Tobacco Use  Smoking Status Former   Current packs/day: 0.00   Average packs/day: 20.0 packs/day for 1 year (20.0 ttl pk-yrs)   Types: Cigarettes   Start date: 46   Quit date: 90   Years since quitting: 40.9  Smokeless Tobacco Never    Allergies  Allergen Reactions   Clindamycin Diarrhea and Nausea Only   Venlafaxine  Other (See Comments)    Not feeling well   Atorvastatin      Myalgias, GI upset   Rosuvastatin      Myalgias, GI upset   Tramadol Nausea Only   Zetia  [Ezetimibe ] Other (See Comments)    Muscle ache   Evolocumab  Other (See Comments)    Myalgias, Fatigue   Objective:  There were no vitals filed for this visit. There is no height or weight on file to calculate BMI. Constitutional Well developed. Well nourished.  Vascular Dorsalis pedis pulses palpable bilaterally. Posterior tibial pulses palpable bilaterally. Capillary refill normal to all digits.  No cyanosis or clubbing noted. Pedal hair growth normal.  Neurologic Normal speech. Oriented to person, place, and time. Epicritic sensation to light touch  grossly present bilaterally.  Dermatologic Nails well groomed and normal in appearance. No open wounds. No skin lesions.  Orthopedic: Normal joint ROM without pain or crepitus bilaterally. Hallux abductovalgus deformity present this is track bound no tracking deformity no intra-articular first MPJ pain noted.  No crepitus noted Left 1st MPJ diminished range of motion. Left 1st TMT without gross hypermobility. Right 1st MPJ diminished range of motion  Right 1st TMT without gross hypermobility. Lesser digital contractures absent bilaterally.   Radiographs: Taken and reviewed. Hallux abductovalgus deformity present. Metatarsal parabola normal. 1st/2nd IMA: 1 15 degrees; TSP: 5 out of 7  Assessment:   1.  Bunion   2. Encounter for preoperative examination for general surgical procedure    Plan:  Patient was evaluated and treated and all questions answered.  Hallux abductovalgus deformity, right foot -XR as above. -Patient has failed all conservative therapy and wishes to proceed with surgical intervention. All risks, benefits, and alternatives discussed with patient. No guarantees given. Consent reviewed and signed by patient. Post-op course explained at length. -Planned procedures: Right minimally invasive metatarsal osteotomy/chevron osteotomy with possible phalangeal osteotomy -Risk factors: None -Informed surgical risk consent was reviewed and read aloud to the patient.  I reviewed the films.  I have discussed my findings with the patient in great detail.  I have discussed all risks including but not limited to infection, stiffness, scarring, limp, disability, deformity, damage to blood vessels and nerves, numbness, poor healing, need for braces, arthritis, chronic pain, amputation, death.  All benefits and realistic expectations discussed in great detail.  I have made no promises as to the outcome.  I have provided realistic expectations.  I have offered the patient a 2nd opinion, which they have declined and assured me they preferred to proceed despite the risks   No follow-ups on file.

## 2023-12-12 ENCOUNTER — Ambulatory Visit: Payer: Medicare Other | Admitting: Hematology and Oncology

## 2023-12-13 ENCOUNTER — Telehealth: Payer: Self-pay

## 2023-12-13 NOTE — Telephone Encounter (Signed)
 Spoke with patient and confirmed appointment on 11/19

## 2023-12-14 ENCOUNTER — Telehealth: Payer: Self-pay

## 2023-12-14 ENCOUNTER — Inpatient Hospital Stay

## 2023-12-14 ENCOUNTER — Inpatient Hospital Stay: Attending: Hematology and Oncology | Admitting: Hematology and Oncology

## 2023-12-14 VITALS — BP 146/67 | HR 78 | Temp 97.9°F | Resp 16 | Wt 151.6 lb

## 2023-12-14 DIAGNOSIS — Z853 Personal history of malignant neoplasm of breast: Secondary | ICD-10-CM | POA: Insufficient documentation

## 2023-12-14 DIAGNOSIS — Z823 Family history of stroke: Secondary | ICD-10-CM | POA: Diagnosis not present

## 2023-12-14 DIAGNOSIS — Z79899 Other long term (current) drug therapy: Secondary | ICD-10-CM | POA: Diagnosis not present

## 2023-12-14 DIAGNOSIS — R634 Abnormal weight loss: Secondary | ICD-10-CM | POA: Diagnosis not present

## 2023-12-14 DIAGNOSIS — M47812 Spondylosis without myelopathy or radiculopathy, cervical region: Secondary | ICD-10-CM | POA: Insufficient documentation

## 2023-12-14 DIAGNOSIS — Z7982 Long term (current) use of aspirin: Secondary | ICD-10-CM | POA: Diagnosis not present

## 2023-12-14 DIAGNOSIS — Z170 Estrogen receptor positive status [ER+]: Secondary | ICD-10-CM | POA: Diagnosis not present

## 2023-12-14 DIAGNOSIS — Z6826 Body mass index (BMI) 26.0-26.9, adult: Secondary | ICD-10-CM | POA: Diagnosis not present

## 2023-12-14 DIAGNOSIS — C50411 Malignant neoplasm of upper-outer quadrant of right female breast: Secondary | ICD-10-CM

## 2023-12-14 DIAGNOSIS — Z881 Allergy status to other antibiotic agents status: Secondary | ICD-10-CM | POA: Insufficient documentation

## 2023-12-14 DIAGNOSIS — M19011 Primary osteoarthritis, right shoulder: Secondary | ICD-10-CM | POA: Insufficient documentation

## 2023-12-14 DIAGNOSIS — Z820 Family history of epilepsy and other diseases of the nervous system: Secondary | ICD-10-CM | POA: Insufficient documentation

## 2023-12-14 DIAGNOSIS — F419 Anxiety disorder, unspecified: Secondary | ICD-10-CM | POA: Insufficient documentation

## 2023-12-14 DIAGNOSIS — Z923 Personal history of irradiation: Secondary | ICD-10-CM | POA: Diagnosis not present

## 2023-12-14 DIAGNOSIS — Z8049 Family history of malignant neoplasm of other genital organs: Secondary | ICD-10-CM | POA: Insufficient documentation

## 2023-12-14 DIAGNOSIS — Z8249 Family history of ischemic heart disease and other diseases of the circulatory system: Secondary | ICD-10-CM | POA: Insufficient documentation

## 2023-12-14 DIAGNOSIS — Z885 Allergy status to narcotic agent status: Secondary | ICD-10-CM | POA: Insufficient documentation

## 2023-12-14 DIAGNOSIS — Z87891 Personal history of nicotine dependence: Secondary | ICD-10-CM | POA: Insufficient documentation

## 2023-12-14 DIAGNOSIS — Z800 Family history of malignant neoplasm of digestive organs: Secondary | ICD-10-CM | POA: Insufficient documentation

## 2023-12-14 DIAGNOSIS — Z86711 Personal history of pulmonary embolism: Secondary | ICD-10-CM | POA: Diagnosis not present

## 2023-12-14 LAB — CMP (CANCER CENTER ONLY)
ALT: 15 U/L (ref 0–44)
AST: 24 U/L (ref 15–41)
Albumin: 4.6 g/dL (ref 3.5–5.0)
Alkaline Phosphatase: 75 U/L (ref 38–126)
Anion gap: 10 (ref 5–15)
BUN: 16 mg/dL (ref 8–23)
CO2: 28 mmol/L (ref 22–32)
Calcium: 10.3 mg/dL (ref 8.9–10.3)
Chloride: 102 mmol/L (ref 98–111)
Creatinine: 0.75 mg/dL (ref 0.44–1.00)
GFR, Estimated: >60 mL/min (ref >60)
Glucose, Bld: 100 mg/dL — ABNORMAL HIGH (ref 70–99)
Potassium: 4.1 mmol/L (ref 3.5–5.1)
Sodium: 140 mmol/L (ref 135–145)
Total Bilirubin: 0.4 mg/dL (ref 0.0–1.2)
Total Protein: 7.5 g/dL (ref 6.5–8.1)

## 2023-12-14 LAB — CBC WITH DIFFERENTIAL/PLATELET
Abs Immature Granulocytes: 0.01 K/uL (ref 0.00–0.07)
Basophils Absolute: 0.1 K/uL (ref 0.0–0.1)
Basophils Relative: 1 %
Eosinophils Absolute: 0.1 K/uL (ref 0.0–0.5)
Eosinophils Relative: 1 %
HCT: 41.1 % (ref 36.0–46.0)
Hemoglobin: 13.8 g/dL (ref 12.0–15.0)
Immature Granulocytes: 0 %
Lymphocytes Relative: 27 %
Lymphs Abs: 1.9 K/uL (ref 0.7–4.0)
MCH: 32.4 pg (ref 26.0–34.0)
MCHC: 33.6 g/dL (ref 30.0–36.0)
MCV: 96.5 fL (ref 80.0–100.0)
Monocytes Absolute: 0.6 K/uL (ref 0.1–1.0)
Monocytes Relative: 8 %
Neutro Abs: 4.4 K/uL (ref 1.7–7.7)
Neutrophils Relative %: 63 %
Platelets: 275 K/uL (ref 150–400)
RBC: 4.26 MIL/uL (ref 3.87–5.11)
RDW: 12.6 % (ref 11.5–15.5)
WBC: 7 K/uL (ref 4.0–10.5)
nRBC: 0 % (ref 0.0–0.2)

## 2023-12-14 NOTE — Telephone Encounter (Signed)
 Called pt to let her know that lab results are WNL per MD. She verbalized thanks and understanding.

## 2023-12-14 NOTE — Progress Notes (Signed)
 [facility]  [phone] [phone]     ID: Melissa [name] DOB: 09/30/1950  MR#: [id number]  RDW#:[id number]  Patient Care Team: Debrah Josette ORN., PA-C as PCP - General (Family Medicine) Court Dorn PARAS, MD as PCP - Cardiology (Cardiology) Vanderbilt Ned, MD as Consulting Physician (General Surgery) Dewey Rush, MD as Consulting Physician (Radiation Oncology) Tanda Prentice DEL, MD as Consulting Physician (Family Medicine) Debrah Josette ORN., PA-C as Physician Assistant (Family Medicine) Luis Purchase, MD as Consulting Physician (Gastroenterology) Crawford, Morna Pickle, NP as Nurse Practitioner (Hematology and Oncology) Loretha Ash, MD as Medical Oncologist (Hematology and Oncology)  CHIEF COMPLAINT: Estrogen receptor positive breast cancer  CURRENT TREATMENT: Anastrozole ; alendronate   INTERVAL HISTORY:  Discussed the use of AI scribe software for clinical note transcription with the patient, who gave verbal consent to proceed.  History of Present Illness Melissa [name] is a 73 year old female with a history of breast cancer who presents for routine follow-up and blood work.  She requests blood work for reassurance, despite having completed her anastrozole  treatment for breast cancer. She acknowledges that the blood work is not specific to breast cancer but finds it reassuring. A CBC and CMP done in June were normal.  She experiences ongoing shoulder and neck pain, present for over a year and a half. A prior neck X-ray confirmed arthritis. The pain is worsening, radiating from her shoulder to her neck, and is exacerbated by her bra strap. Being right-handed may contribute to the discomfort.  She is concerned about inflammation in her body, possibly related to her cancer history. She cannot take ibuprofen  due to stomach bleeds and relies on Tylenol  for pain management, taking extra strength Tylenol , sometimes combined with arthritis strength,  up to three times a day, not exceeding two grams per day.  Her current medications include Praluent  for cholesterol, aspirin , Prilosec, vitamin D, and Xanax as needed. She has recently lost weight, attributed to efforts to manage her cholesterol levels.  No new symptoms since the last visit except for shoulder and neck pain.   Mammogram and ultrasound from June 2024 without any concerns. COVID 19 VACCINATION STATUS: fully vaccinated [employer]), status post 1 booster  HISTORY OF CURRENT ILLNESS: From the original intake note:  Melissa [name] had routine screening bilateral mammography with tomography at [facility] on 07/12/2017 showing; breast density category C. There was a possible mass in the right breast. On 07/14/2017 she completed unilateral right ultrasonography at [facility] that showed an irregular hypoechoic 1.6 cm mass in the right upper outer quadrant and posterior depth. Evaluation of the right axilla showed 4 lymph nodes with cortical thickening suspicious of malignancy.   Accordingly on 07/19/2017 she proceeded to biopsy of the right breast area and 1 lymph node in question. 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  The lymph node was biopsied showing no evidence of carcinoma, which was found to be concordant.  Prognostic indicators significant for: estrogen receptor, 95% positive and progesterone receptor, 95% positive, both with strong staining intensity. Proliferation marker Ki67 at 10% . HER2 not amplified with ratios HER2/CEP17 signals 1.22 and average HER2 copies per cell 1.95  The patient's subsequent history is as detailed below.   PAST MEDICAL HISTORY: Past Medical History:  Diagnosis Date   Anxiety    Arthritis    shoulders, hands   Cancer (HCC) 07/2017   right breast cancer   Chest pain    Diverticulosis  GERD (gastroesophageal reflux disease)    High cholesterol    IBS  (irritable bowel syndrome)    Kidney disorder    Pt states only has left kidney   Panic attacks    Pulmonary embolism (HCC) 2018   pt states resolved and no longer takes blood thinners  Small pulmonary emboli - eliquis for 6 months- resolved.  Depressive and anxiety moods - xanax One [facility] Kidney   PAST SURGICAL HISTORY: Past Surgical History:  Procedure Laterality Date   BREAST LUMPECTOMY WITH RADIOACTIVE SEED AND SENTINEL LYMPH NODE BIOPSY Right 08/23/2017   Procedure: BREAST LUMPECTOMY WITH RADIOACTIVE SEED AND SENTINEL LYMPH NODE BIOPSY;  Surgeon: Vanderbilt Ned, MD;  Location: [facility];  Service: General;  Laterality: Right;   TUBAL LIGATION      FAMILY HISTORY Family History  Problem Relation Age of Onset   Alzheimer's disease Mother    Stroke Father 31   Heart attack Maternal Grandmother    Heart attack Paternal Grandmother    Cervical cancer Cousin    Colon cancer Paternal Aunt    Colon cancer Cousin   The patient's father died at age 66 due to CHF. The patient's mother died at age 75 due to Alzheimer's. The patient has 1 brother and 4 sisters. There was a paternal 1st cousin diagnosed with cervical cancer at age 39. There was a paternal aunt diagnosed with colon cancer at age 57. There was a paternal 1st cousin diagnosed with colon cancer at age 21. The patient denies a family history of breast or ovarian cancer.     GYNECOLOGIC HISTORY:  No LMP recorded. Patient is postmenopausal. Menarche: 73 years old Age at first live birth: 73 years old She is GX P1. Her LMP was in 2008. She used oral contraception with no complications. She did not used HRT.     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 Her husband, Deward, died in 10/27/18due to pancreatic cancer. He was a chartered certified accountant. She has 47 year old dog, a research officer, trade union. The patient's daughter, [name] lives in [place] and works at [employer]. Twyla has two blood grandchildren and two step-grandchildren.   ADVANCED DIRECTIVES: The patient plans to name her daughter, Vanetta as her HCPOA. The appropriate forms were given at the 07/27/2017 visit.    HEALTH MAINTENANCE: Social History   Tobacco Use   Smoking status: Former    Current packs/day: 0.00    Average packs/day: 20.0 packs/day for 1 year (20.0 ttl pk-yrs)    Types: Cigarettes    Start date: 95    Quit date: 1985    Years since quitting: 40.9   Smokeless tobacco: Never  Substance Use Topics   Alcohol use: Yes    Comment: social   Drug use: Never    Colonoscopy: Feb. 2019/ [facility]/ diverticulosis  PAP: Nov. 2018  Bone density: Nov. 2018 / osteopenia   Allergies  Allergen Reactions   Clindamycin Diarrhea and Nausea Only   Venlafaxine  Other (See Comments)    Not feeling well   Atorvastatin      Myalgias, GI upset   Rosuvastatin      Myalgias, GI upset   Tramadol Nausea Only   Zetia  [Ezetimibe ] Other (See Comments)    Muscle ache   Evolocumab  Other (See Comments)    Myalgias, Fatigue    Current Outpatient Medications  Medication Sig Dispense Refill   acetaminophen  (TYLENOL ) 500 MG tablet Take 1,000 mg by mouth  every 6 (six) hours as needed for mild pain.     Alirocumab  (PRALUENT ) 75 MG/ML SOAJ Inject 1 mL (75 mg total) into the skin every 14 (fourteen) days. 2 mL 2   ALPRAZolam (XANAX) 0.25 MG tablet 3 (three) times daily. TAKE 1/2 TABLET IN THE MORNING AND 1/2 IN THE EVENING AT BEDTIME (Patient taking differently: as needed. TAKE 1/2 TABLET IN THE MORNING AND 1/2 IN THE EVENING AT BEDTIME)     anastrozole  (ARIMIDEX ) 1 MG tablet Take 1 tablet (1 mg total) by mouth daily. (Patient not taking: Reported on 07/20/2023) 90 tablet 4   aspirin  EC 81 MG tablet Take 81 mg by mouth daily.     cholecalciferol 2000 units tablet Take 2,000 Units by mouth daily.      hydrocortisone cream 1 % Apply 1 application topically as needed for itching.     omeprazole  (PRILOSEC) 20 MG capsule Take 20 mg by mouth daily.     No current facility-administered medications for this visit.    OBJECTIVE:  white woman who appears younger than stated age  Vitals:   12/14/23 0822  BP: (!) 146/67  Pulse: 78  Resp: 16  Temp: 97.9 F (36.6 C)  SpO2: 96%      Body mass index is 26.85 kg/m.   Wt Readings from Last 3 Encounters:  12/14/23 151 lb 9.6 oz (68.8 kg)  07/20/23 168 lb 1.6 oz (76.2 kg)  12/10/22 168 lb 4.8 oz (76.3 kg)     ECOG FS:1 - Symptomatic but completely ambulatory   Physical Exam Constitutional:      Appearance: Normal appearance.  Cardiovascular:     Rate and Rhythm: Normal rate and regular rhythm.     Pulses: Normal pulses.     Heart sounds: Normal heart sounds.  Chest:     Comments: Bilateral breasts inspected.  No palpable masses or regional adenopathy Musculoskeletal:     Cervical back: Normal range of motion. No rigidity.  Lymphadenopathy:     Cervical: No cervical adenopathy.  Neurological:     Mental Status: She is alert.      LAB RESULTS:  CMP     Component Value Date/Time   NA 140 12/10/2022 0854   K 4.2 12/10/2022 0854   CL 103 12/10/2022 0854   CO2 30 12/10/2022 0854   GLUCOSE 102 (H) 12/10/2022 0854   BUN 16 12/10/2022 0854   CREATININE 0.73 12/10/2022 0854   CALCIUM  10.2 12/10/2022 0854   PROT 7.5 12/10/2022 0854   PROT 7.4 06/06/2020 1010   ALBUMIN 4.3 12/10/2022 0854   ALBUMIN 4.7 06/06/2020 1010   AST 18 12/10/2022 0854   ALT 14 12/10/2022 0854   ALKPHOS 66 12/10/2022 0854   BILITOT 0.5 12/10/2022 0854   GFRNONAA >60 12/10/2022 0854   GFRAA >60 12/04/2018 1337   GFRAA >60 08/02/2018 0904    Lab Results  Component Value Date   WBC 7.8 12/10/2022   NEUTROABS 4.8 12/10/2022   HGB 13.6 12/10/2022   HCT 41.5 12/10/2022   MCV 97.4 12/10/2022   PLT 299 12/10/2022   No results found for: LABCA2  No components found for: [id number]  No results for input(s): INR in the last 168  hours.  No results found for: LABCA2  No results found for: CAN199  No results found for: CAN125  No results found for: CAN153  No results found for: CA2729  No components found for: HGQUANT  No results found for: CEA1, CEA / No  results found for: CEA1, CEA   No results found for: AFPTUMOR  No results found for: CHROMOGRNA  No results found for: TOTALPROTELP, ALBUMINELP, A1GS, A2GS, BETS, BETA2SER, GAMS, MSPIKE, SPEI (this displays SPEP labs)  No results found for: KPAFRELGTCHN, LAMBDASER, KAPLAMBRATIO (kappa/lambda light chains)  No results found for: HGBA, HGBA2QUANT, HGBFQUANT, HGBSQUAN (Hemoglobinopathy evaluation)   No results found for: LDH  No results found for: IRON, TIBC, IRONPCTSAT (Iron and TIBC)  No results found for: FERRITIN  Urinalysis    Component Value Date/Time   COLORURINE STRAW (A) 11/10/2018 1242   APPEARANCEUR CLEAR 11/10/2018 1242   LABSPEC <1.005 (L) 11/10/2018 1242   PHURINE 7.0 11/10/2018 1242   GLUCOSEU NEGATIVE 11/10/2018 1242   HGBUR TRACE (A) 11/10/2018 1242   BILIRUBINUR NEGATIVE 11/10/2018 1242   KETONESUR NEGATIVE 11/10/2018 1242   PROTEINUR NEGATIVE 11/10/2018 1242   NITRITE NEGATIVE 11/10/2018 1242   LEUKOCYTESUR NEGATIVE 11/10/2018 1242    STUDIES: DG Foot Complete Right Result Date: 12/09/2023 Please see detailed radiograph report in office note.    ELIGIBLE FOR AVAILABLE RESEARCH PROTOCOL: no   ASSESSMENT: 73 y.o. [place], [place] woman status post right breast upper outer quadrant biopsy 07/19/2017 for a clinical T1c NX, stage IA invasive ductal carcinoma, grade 2, estrogen and progesterone receptor positive, HER-2 not amplified, with an MIB-1 of 10%  (a) 1 of 4 suspicious axillary lymph nodes biopsied 07/19/2017 was negative  (1) status post right lumpectomy and sentinel lymph node sampling 08/23/2017 for a pT1c pN0, stage IA invasive ductal  carcinoma, grade 2, with negative margins  (a) total of 5 sentinel lymph nodes removed  (2) The Oncotype DX score was 12, predicting a risk of outside the breast recurrence over the next 9 years of 3% if the patient's only systemic therapy is tamoxifen for 5 years.  It also predicts no significant benefit from chemotherapy.  (3) adjuvant radiation completed 09/27/2017 - 10/24/2017  Site/dose: The patient initially received a dose of 42.56 Gy in 16 fractions to the breast using whole-breast tangent fields. This was delivered using a 3-D conformal technique. The patient then received a boost to the seroma. This delivered an additional 8 Gy in 4 fractions using a 3 field photon technique due to the depth of the seroma. The total dose was 50.56 Gy.  (4) anastrozole  started 11/25/2017  (a) density 05/10/2016 showed a T score of -1.9  (b) repeat DEXA scan 06/29/2018 showed a T score of -2.3  (c) alendronate /Fosamax  started 08/02/2018  (5) history of acute right lower lobe pulmonary embolus 03/19/2016; bilateral Dopplers lower extremity same day were clear  (a) status post apixaban x6 months   PLAN:  Assessment and Plan Assessment & Plan History of malignant neoplasm of right breast, upper-outer quadrant Completed five years of anastrozole . Discussed MRD testing for recurrence, not FDA approved. She will think about it. - Recent labs normal, pt however feels safe to do them every time she comes. - No concerns on exam.  - Ordered CBC and CMP. - Provided information on MRD testing.  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  - Referral requested to orthopedic specialist for evaluation and potential cortisone or plasma injections. - Advised to limit Tylenol  intake to 2 grams per day  FU in 1 yr.  Total encounter time 20 minutes.*  *Total Encounter Time as defined by the Centers for Medicare and Medicaid Services includes, in addition  to the face-to-face time of a patient visit (documented  in the note above) non-face-to-face time: obtaining and reviewing outside history, ordering and reviewing medications, tests or procedures, care coordination (communications with other health care professionals or caregivers) and documentation in the medical record.

## 2023-12-20 ENCOUNTER — Encounter (HOSPITAL_BASED_OUTPATIENT_CLINIC_OR_DEPARTMENT_OTHER): Payer: Self-pay | Admitting: Internal Medicine

## 2023-12-20 ENCOUNTER — Other Ambulatory Visit (HOSPITAL_BASED_OUTPATIENT_CLINIC_OR_DEPARTMENT_OTHER): Payer: Self-pay | Admitting: Internal Medicine

## 2023-12-20 ENCOUNTER — Other Ambulatory Visit (HOSPITAL_BASED_OUTPATIENT_CLINIC_OR_DEPARTMENT_OTHER): Payer: Self-pay

## 2023-12-20 DIAGNOSIS — E785 Hyperlipidemia, unspecified: Secondary | ICD-10-CM

## 2023-12-20 MED ORDER — PRALUENT 75 MG/ML ~~LOC~~ SOAJ
75.0000 mg | SUBCUTANEOUS | 5 refills | Status: AC
  Filled 2023-12-20 – 2023-12-30 (×2): qty 2, 28d supply, fill #0
  Filled 2024-01-31: qty 2, 28d supply, fill #1
  Filled 2024-03-01: qty 2, 28d supply, fill #2

## 2023-12-30 ENCOUNTER — Other Ambulatory Visit (HOSPITAL_BASED_OUTPATIENT_CLINIC_OR_DEPARTMENT_OTHER): Payer: Self-pay

## 2024-01-04 ENCOUNTER — Other Ambulatory Visit (HOSPITAL_BASED_OUTPATIENT_CLINIC_OR_DEPARTMENT_OTHER): Payer: Self-pay

## 2024-01-24 ENCOUNTER — Other Ambulatory Visit (HOSPITAL_BASED_OUTPATIENT_CLINIC_OR_DEPARTMENT_OTHER): Payer: Self-pay

## 2024-01-24 MED ORDER — PREVNAR 20 0.5 ML IM SUSY
0.5000 mL | PREFILLED_SYRINGE | Freq: Once | INTRAMUSCULAR | 0 refills | Status: AC
  Filled 2024-01-24: qty 0.5, 1d supply, fill #0

## 2024-01-24 MED ORDER — PNEUMOCOCCAL 13-VAL CONJ VACC IM SUSP
0.5000 mL | Freq: Once | INTRAMUSCULAR | 0 refills | Status: AC
  Filled 2024-01-24: qty 0.5, 1d supply, fill #0

## 2024-01-30 ENCOUNTER — Telehealth: Payer: Self-pay | Admitting: Podiatry

## 2024-01-30 NOTE — Telephone Encounter (Signed)
 Called and left message for patient to contact office back to schedule surgery. MyChart message sent as well.

## 2024-02-15 NOTE — Telephone Encounter (Signed)
 Patient returned call to schedule surgery- called and lmom for her to call back to schedule

## 2024-12-11 ENCOUNTER — Inpatient Hospital Stay: Attending: Hematology and Oncology

## 2024-12-11 ENCOUNTER — Inpatient Hospital Stay: Admitting: Hematology and Oncology
